# Patient Record
Sex: Male | Born: 1950 | Race: White | Hispanic: No | Marital: Married | Smoking: Never smoker
Health system: Southern US, Community
[De-identification: ages and names within clinical notes are randomized; demographics above are authoritative.]

## PROBLEM LIST (undated history)

## (undated) DIAGNOSIS — J45909 Unspecified asthma, uncomplicated: Secondary | ICD-10-CM

## (undated) DIAGNOSIS — R7303 Prediabetes: Secondary | ICD-10-CM

## (undated) DIAGNOSIS — G4730 Sleep apnea, unspecified: Secondary | ICD-10-CM

## (undated) DIAGNOSIS — I10 Essential (primary) hypertension: Secondary | ICD-10-CM

## (undated) DIAGNOSIS — M4316 Spondylolisthesis, lumbar region: Secondary | ICD-10-CM

## (undated) DIAGNOSIS — I4891 Unspecified atrial fibrillation: Secondary | ICD-10-CM

## (undated) DIAGNOSIS — Z87442 Personal history of urinary calculi: Secondary | ICD-10-CM

## (undated) DIAGNOSIS — E669 Obesity, unspecified: Secondary | ICD-10-CM

## (undated) DIAGNOSIS — N200 Calculus of kidney: Secondary | ICD-10-CM

## (undated) DIAGNOSIS — M1990 Unspecified osteoarthritis, unspecified site: Secondary | ICD-10-CM

## (undated) HISTORY — PX: COLONOSCOPY: SHX5424

## (undated) HISTORY — PX: LUMBAR FUSION: SHX111

## (undated) HISTORY — PX: TOOTH EXTRACTION: SUR596

---

## 2005-02-22 ENCOUNTER — Ambulatory Visit: Payer: Self-pay | Admitting: Unknown Physician Specialty

## 2010-08-13 ENCOUNTER — Emergency Department: Payer: Self-pay | Admitting: Emergency Medicine

## 2015-05-23 ENCOUNTER — Observation Stay
Admission: EM | Admit: 2015-05-23 | Discharge: 2015-05-27 | Disposition: A | Discharge status: Home / Self Care | Payer: BC Managed Care – PPO | Attending: Internal Medicine | Admitting: Internal Medicine

## 2015-05-23 ENCOUNTER — Emergency Department: Payer: BC Managed Care – PPO

## 2015-05-23 ENCOUNTER — Encounter: Payer: Self-pay | Admitting: Emergency Medicine

## 2015-05-23 DIAGNOSIS — M25552 Pain in left hip: Secondary | ICD-10-CM | POA: Diagnosis not present

## 2015-05-23 DIAGNOSIS — Z6841 Body Mass Index (BMI) 40.0 and over, adult: Secondary | ICD-10-CM | POA: Diagnosis not present

## 2015-05-23 DIAGNOSIS — Z87442 Personal history of urinary calculi: Secondary | ICD-10-CM | POA: Diagnosis not present

## 2015-05-23 DIAGNOSIS — M1611 Unilateral primary osteoarthritis, right hip: Principal | ICD-10-CM | POA: Insufficient documentation

## 2015-05-23 DIAGNOSIS — Z7982 Long term (current) use of aspirin: Secondary | ICD-10-CM | POA: Diagnosis not present

## 2015-05-23 DIAGNOSIS — M1990 Unspecified osteoarthritis, unspecified site: Secondary | ICD-10-CM | POA: Diagnosis not present

## 2015-05-23 DIAGNOSIS — M25551 Pain in right hip: Secondary | ICD-10-CM | POA: Diagnosis present

## 2015-05-23 DIAGNOSIS — M79604 Pain in right leg: Secondary | ICD-10-CM | POA: Diagnosis not present

## 2015-05-23 DIAGNOSIS — M1A9XX0 Chronic gout, unspecified, without tophus (tophi): Secondary | ICD-10-CM | POA: Insufficient documentation

## 2015-05-23 DIAGNOSIS — Z880 Allergy status to penicillin: Secondary | ICD-10-CM | POA: Insufficient documentation

## 2015-05-23 DIAGNOSIS — I10 Essential (primary) hypertension: Secondary | ICD-10-CM | POA: Insufficient documentation

## 2015-05-23 DIAGNOSIS — M25559 Pain in unspecified hip: Secondary | ICD-10-CM | POA: Diagnosis present

## 2015-05-23 DIAGNOSIS — Z23 Encounter for immunization: Secondary | ICD-10-CM | POA: Insufficient documentation

## 2015-05-23 DIAGNOSIS — J45909 Unspecified asthma, uncomplicated: Secondary | ICD-10-CM | POA: Diagnosis not present

## 2015-05-23 DIAGNOSIS — R52 Pain, unspecified: Secondary | ICD-10-CM

## 2015-05-23 HISTORY — DX: Unspecified osteoarthritis, unspecified site: M19.90

## 2015-05-23 HISTORY — DX: Unspecified asthma, uncomplicated: J45.909

## 2015-05-23 HISTORY — DX: Calculus of kidney: N20.0

## 2015-05-23 HISTORY — DX: Essential (primary) hypertension: I10

## 2015-05-23 HISTORY — DX: Obesity, unspecified: E66.9

## 2015-05-23 LAB — SEDIMENTATION RATE: Sed Rate: 4 mm/hr (ref 0–20)

## 2015-05-23 LAB — CBC
HEMATOCRIT: 49.3 % (ref 40.0–52.0)
HEMOGLOBIN: 16.5 g/dL (ref 13.0–18.0)
MCH: 28.8 pg (ref 26.0–34.0)
MCHC: 33.5 g/dL (ref 32.0–36.0)
MCV: 85.9 fL (ref 80.0–100.0)
Platelets: 280 10*3/uL (ref 150–440)
RBC: 5.74 MIL/uL (ref 4.40–5.90)
RDW: 14.3 % (ref 11.5–14.5)
WBC: 11.8 10*3/uL — ABNORMAL HIGH (ref 3.8–10.6)

## 2015-05-23 MED ORDER — ONDANSETRON HCL 4 MG/2ML IJ SOLN
4.0000 mg | Freq: Once | INTRAMUSCULAR | Status: AC
  Administered 2015-05-23: 4 mg via INTRAVENOUS
  Filled 2015-05-23: qty 2

## 2015-05-23 MED ORDER — OXYCODONE-ACETAMINOPHEN 5-325 MG PO TABS: 1.0000 | ORAL_TABLET | Freq: Four times a day (QID) | ORAL | Status: DC | PRN

## 2015-05-23 MED ORDER — OXYCODONE-ACETAMINOPHEN 5-325 MG PO TABS
2.0000 | ORAL_TABLET | Freq: Once | ORAL | Status: AC
  Administered 2015-05-23: 2 via ORAL
  Filled 2015-05-23: qty 2

## 2015-05-23 MED ORDER — METHYLPREDNISOLONE SODIUM SUCC 125 MG IJ SOLR
125.0000 mg | Freq: Once | INTRAMUSCULAR | Status: DC
  Filled 2015-05-23: qty 2

## 2015-05-23 MED ORDER — TRIAMCINOLONE ACETONIDE 40 MG/ML IJ SUSP
80.0000 mg | Freq: Once | INTRAMUSCULAR | Status: AC
  Administered 2015-05-24: 80 mg via INTRAMUSCULAR
  Filled 2015-05-23: qty 2

## 2015-05-23 MED ORDER — MORPHINE SULFATE (PF) 4 MG/ML IV SOLN
4.0000 mg | Freq: Once | INTRAVENOUS | Status: AC
  Administered 2015-05-23: 4 mg via INTRAVENOUS
  Filled 2015-05-23: qty 1

## 2015-05-23 NOTE — ED Notes (Signed)
Luke Morgan, Buyer, [employer], sat patient up on bed and attempted to walk patient per MD VORB.  Pt stood up and states didn't feel comfortable to walk and sat back down.  MD informed.

## 2015-05-23 NOTE — ED Notes (Signed)
Pt to ED from home via EMS c/o right hip pain for several days.  Pt states traveled to [place] and walked more than normal, denies injury.  Pt states pain is worse with movement and weight.  Pt has hx of arthritis.  Pt receives cortisone shots.  Pt is A&Ox4, speaking in complete and coherent sentences and in NAD at this time.

## 2015-05-23 NOTE — ED Notes (Signed)
Patient transported to Ultrasound 

## 2015-05-23 NOTE — ED Provider Notes (Signed)
[facility] Emergency Department Provider Note  Time seen: 8:31 PM  I have reviewed the triage vital signs and the nursing notes.   HISTORY  Chief Complaint Hip Pain    HPI Luke Morgan is a 64 y.o. male who presents the emergency department with right hip pain. According to the patient for the past one week he said progressively worsening right hip pain. For the past 2 days he has not been able to ambulate due to the pain.Denies any trauma. Denies any fever, nausea or vomiting. States a history of arthritis for which she has received cortisone shots in the past for his knee, but never for his hip. Describes his pain as minimal when lying in bed, he is able to move his hip with minimal discomfort. States severe pain if he attempts to place weight on the hip or ambulate.     No past medical history on file.  There are no active problems to display for this patient.   No past surgical history on file.  No current outpatient prescriptions on file.  Allergies Review of patient's allergies indicates not on file.  No family history on file.  Social History Social History  Substance Use Topics  . Smoking status: Not on file  . Smokeless tobacco: Not on file  . Alcohol Use: Not on file    Review of Systems Constitutional: Negative for fever. Cardiovascular: Negative for chest pain. Respiratory: Negative for shortness of breath. Gastrointestinal: Negative for abdominal pain Musculoskeletal: Positive for right hip pain Neurological: Negative for headache. Denies any weakness or numbness. 10-point ROS otherwise negative.  ____________________________________________   PHYSICAL EXAM:  Constitutional: Alert and oriented. Well appearing and in no distress. Eyes: Normal exam ENT   Head: Normocephalic and atraumatic.   Mouth/Throat: Mucous membranes are moist. Cardiovascular: Normal rate, regular rhythm. No murmur Respiratory: Normal  respiratory effort without tachypnea nor retractions. Breath sounds are clear and equal bilaterally. No wheezes/rales/rhonchi. Gastrointestinal: Soft and nontender. No distention.  Obese. Musculoskeletal: Nontender right hip. Stable pelvis. Normal range of motion in hip without eliciting discomfort. Neurologic:  Normal speech and language. No gross focal neurologic deficits  Skin:  Skin is warm, dry and intact.  Psychiatric: Mood and affect are normal. Speech and behavior are normal  ____________________________________________     RADIOLOGY  X-rays negative.  ____________________________________________   INITIAL IMPRESSION / ASSESSMENT AND PLAN / ED COURSE  Pertinent labs & imaging results that were available during my care of the patient were reviewed by me and considered in my medical decision making (see chart for details).  Patient presents for right hip pain. Denies any trauma. Patient has a nontender exam with good range of motion in the right hip. Patient states minimal discomfort while ranging the hip or while lying in bed but severe discomfort. So ambulate. Has been taking naproxen without relief. Patient states he called his orthopedist to see if he could come to his house to do an injection of steroids. The orthopedist that he does not do house calls and the patient would have to go to his office, but the patient states he is unable to walk due to pain so he called EMS to bring him to the ER.  Patient continues to have right hip pain. X-ray negative. Very reassuringly the patient has a normal ESR. Do not suspect septic joint given a great range of motion. Do not suspect gouty arthritis given the great range of motion. We'll obtain a CT scan to  rule out occult fracture. We will also give Solu-Medrol as he states steroids have helped with his joint pains in the past.   Patient and did ambulate, had pain in right leg. He states it is somewhat in the hip, but also feels that it  extends down towards his knee on the inside of his thigh. Unfortunately the patient's weight will not allow [place] to obtain a CT scan. The patient has great range of motion in the hip, my suspicion for an occult fracture is extremely low. We will obtain an ultrasound to rule out deep venous thrombosis. We will dose Kenalog IM, Percocet orally, and monitor in the emergency department.   Ultrasound pending. Patient care signed out to Dr. Beather Arbour. ____________________________________________   FINAL CLINICAL IMPRESSION(S) / ED DIAGNOSES  Right hip pain   Harvest Dark, MD 05/23/15 [phone]

## 2015-05-23 NOTE — Discharge Instructions (Signed)

## 2015-05-24 ENCOUNTER — Encounter: Payer: Self-pay | Admitting: Internal Medicine

## 2015-05-24 DIAGNOSIS — M25559 Pain in unspecified hip: Secondary | ICD-10-CM | POA: Diagnosis present

## 2015-05-24 LAB — CREATININE, SERUM: Creatinine, Ser: 1.04 mg/dL (ref 0.61–1.24)

## 2015-05-24 MED ORDER — MOMETASONE FURO-FORMOTEROL FUM 100-5 MCG/ACT IN AERO
2.0000 | INHALATION_SPRAY | Freq: Two times a day (BID) | RESPIRATORY_TRACT | Status: DC
  Filled 2015-05-24: qty 8.8

## 2015-05-24 MED ORDER — HYDRALAZINE HCL 50 MG PO TABS
50.0000 mg | ORAL_TABLET | Freq: Three times a day (TID) | ORAL | Status: DC
  Administered 2015-05-24 – 2015-05-27 (×10): 50 mg via ORAL
  Filled 2015-05-24 (×10): qty 1

## 2015-05-24 MED ORDER — ACETAMINOPHEN 325 MG PO TABS: 650.0000 mg | ORAL_TABLET | Freq: Four times a day (QID) | ORAL | Status: DC | PRN

## 2015-05-24 MED ORDER — ACETAMINOPHEN 650 MG RE SUPP: 650.0000 mg | Freq: Four times a day (QID) | RECTAL | Status: DC | PRN

## 2015-05-24 MED ORDER — HYDROCHLOROTHIAZIDE 25 MG PO TABS
25.0000 mg | ORAL_TABLET | Freq: Every day | ORAL | Status: DC
  Administered 2015-05-24 – 2015-05-27 (×4): 25 mg via ORAL
  Filled 2015-05-24 (×4): qty 1

## 2015-05-24 MED ORDER — DOCUSATE SODIUM 100 MG PO CAPS
100.0000 mg | ORAL_CAPSULE | Freq: Two times a day (BID) | ORAL | Status: DC
  Administered 2015-05-24 – 2015-05-27 (×8): 100 mg via ORAL
  Filled 2015-05-24 (×8): qty 1

## 2015-05-24 MED ORDER — SODIUM CHLORIDE 0.9 % IJ SOLN
3.0000 mL | Freq: Two times a day (BID) | INTRAMUSCULAR | Status: DC
  Administered 2015-05-24 – 2015-05-26 (×5): 3 mL via INTRAVENOUS

## 2015-05-24 MED ORDER — LOSARTAN POTASSIUM-HCTZ 100-25 MG PO TABS: 1.0000 | ORAL_TABLET | Freq: Every day | ORAL | Status: DC

## 2015-05-24 MED ORDER — FLEET ENEMA 7-19 GM/118ML RE ENEM: 1.0000 | ENEMA | Freq: Once | RECTAL | Status: DC | PRN

## 2015-05-24 MED ORDER — ASPIRIN EC 81 MG PO TBEC
81.0000 mg | DELAYED_RELEASE_TABLET | Freq: Every day | ORAL | Status: DC
  Administered 2015-05-24 – 2015-05-27 (×4): 81 mg via ORAL
  Filled 2015-05-24 (×4): qty 1

## 2015-05-24 MED ORDER — POLYETHYLENE GLYCOL 3350 17 G PO PACK: 17.0000 g | PACK | Freq: Every day | ORAL | Status: DC | PRN

## 2015-05-24 MED ORDER — SODIUM CHLORIDE 0.9 % IJ SOLN
3.0000 mL | INTRAMUSCULAR | Status: DC | PRN
  Administered 2015-05-24: 3 mL via INTRAVENOUS
  Filled 2015-05-24: qty 10

## 2015-05-24 MED ORDER — HYDROCODONE-ACETAMINOPHEN 5-325 MG PO TABS
1.0000 | ORAL_TABLET | ORAL | Status: DC | PRN
  Administered 2015-05-24: 2 via ORAL
  Administered 2015-05-24 – 2015-05-25 (×4): 1 via ORAL
  Administered 2015-05-25: 2 via ORAL
  Administered 2015-05-25: 1 via ORAL
  Administered 2015-05-25: 2 via ORAL
  Administered 2015-05-25: 1 via ORAL
  Administered 2015-05-26 – 2015-05-27 (×5): 2 via ORAL
  Administered 2015-05-27: 1 via ORAL
  Filled 2015-05-24 (×3): qty 2
  Filled 2015-05-24: qty 1
  Filled 2015-05-24: qty 2
  Filled 2015-05-24: qty 1
  Filled 2015-05-24: qty 2
  Filled 2015-05-24: qty 1
  Filled 2015-05-24: qty 2
  Filled 2015-05-24: qty 1
  Filled 2015-05-24 (×2): qty 2
  Filled 2015-05-24 (×3): qty 1

## 2015-05-24 MED ORDER — ALLOPURINOL 300 MG PO TABS
300.0000 mg | ORAL_TABLET | Freq: Every day | ORAL | Status: DC
  Administered 2015-05-24 – 2015-05-27 (×4): 300 mg via ORAL
  Filled 2015-05-24 (×4): qty 1

## 2015-05-24 MED ORDER — ONDANSETRON HCL 4 MG PO TABS: 4.0000 mg | ORAL_TABLET | Freq: Four times a day (QID) | ORAL | Status: DC | PRN

## 2015-05-24 MED ORDER — LOSARTAN POTASSIUM 50 MG PO TABS
100.0000 mg | ORAL_TABLET | Freq: Every day | ORAL | Status: DC
  Administered 2015-05-24 – 2015-05-27 (×4): 100 mg via ORAL
  Filled 2015-05-24 (×4): qty 2

## 2015-05-24 MED ORDER — ASPIRIN EC 81 MG PO TBEC: 81.0000 mg | DELAYED_RELEASE_TABLET | Freq: Every day | ORAL | Status: DC

## 2015-05-24 MED ORDER — ENOXAPARIN SODIUM 40 MG/0.4ML ~~LOC~~ SOLN: 40.0000 mg | SUBCUTANEOUS | Status: DC

## 2015-05-24 MED ORDER — ENOXAPARIN SODIUM 40 MG/0.4ML ~~LOC~~ SOLN
40.0000 mg | Freq: Two times a day (BID) | SUBCUTANEOUS | Status: DC
  Administered 2015-05-24 – 2015-05-27 (×7): 40 mg via SUBCUTANEOUS
  Filled 2015-05-24 (×7): qty 0.4

## 2015-05-24 MED ORDER — SODIUM CHLORIDE 0.9 % IV SOLN: 250.0000 mL | INTRAVENOUS | Status: DC | PRN

## 2015-05-24 MED ORDER — ONDANSETRON HCL 4 MG/2ML IJ SOLN: 4.0000 mg | Freq: Four times a day (QID) | INTRAMUSCULAR | Status: DC | PRN

## 2015-05-24 MED ORDER — BISACODYL 5 MG PO TBEC
5.0000 mg | DELAYED_RELEASE_TABLET | Freq: Every day | ORAL | Status: DC | PRN
Start: 2015-05-24 — End: 2015-05-27

## 2015-05-24 MED ORDER — ALBUTEROL SULFATE (2.5 MG/3ML) 0.083% IN NEBU: 2.5000 mg | INHALATION_SOLUTION | RESPIRATORY_TRACT | Status: DC | PRN

## 2015-05-24 NOTE — Evaluation (Signed)
Physical Therapy Evaluation Patient Details Name: Luke Morgan MRN: [id number] DOB: 04-15-1951 Today's Date: 05/24/2015   History of Present Illness  64 yo male with onset of R hip pain and limited tolerance for mobility esp to sit or stand/walk, best with supine  Clinical Impression  Pt was instructed on R hip extension stretch with emphasis on using gentle movement.  Pt was noted to throw himself over onto L side and was not performing correctly and not listening to PT instruction well.  Will anticipate that outpatient therapy will be more potentially beneficial than HHPT but pt has not fully committed to his plan.    Follow Up Recommendations Home health PT;Outpatient PT (outpatient if pt able to get there)    Equipment Recommendations  Rolling walker with 5" wheels    Recommendations for Other Services       Precautions / Restrictions Precautions Precautions: None Restrictions Weight Bearing Restrictions: No      Mobility  Bed Mobility Overal bed mobility: Modified Independent                Transfers Overall transfer level: Modified independent                  Ambulation/Gait Ambulation/Gait assistance: Modified independent (Device/Increase time);Supervision (observed to talk with pt ) Ambulation Distance (Feet): 30 Feet Assistive device: Rolling walker (2 wheeled) Gait Pattern/deviations: Step-through pattern;Decreased stride length;Wide base of support Gait velocity: reduced Gait velocity interpretation: Below normal speed for age/gender    Stairs Stairs:  (not performed)          Wheelchair Mobility    Modified Rankin (Stroke Patients Only)       Balance Overall balance assessment: Modified Independent                                           Pertinent Vitals/Pain Pain Assessment: 0-10 Pain Score: 8  Pain Location: R hip Pain Intervention(s): Limited activity within patient's tolerance;Monitored during  session;Repositioned;Premedicated before session    Home Living Family/patient expects to be discharged to:: Private residence Living Arrangements: Alone   Type of Home: House Home Access: Stairs to enter Entrance Stairs-Rails: Right;Left;Can reach both Secretary/administrator of Steps: 8 Home Layout: One level Home Equipment: None Additional Comments: Pt is not describing concerns about stairs, just about relieving pain on RLE    Prior Function Level of Independence: Independent               Hand Dominance        Extremity/Trunk Assessment   Upper Extremity Assessment: Overall WFL for tasks assessed           Lower Extremity Assessment: Overall WFL for tasks assessed (R hip flexion contracture)      Cervical / Trunk Assessment: Normal  Communication   Communication: No difficulties  Cognition Arousal/Alertness: Awake/alert Behavior During Therapy: WFL for tasks assessed/performed Overall Cognitive Status: Within Functional Limits for tasks assessed                      General Comments General comments (skin integrity, edema, etc.): Pt is in pain in R hip mainly when sitting or standing but relieved by supine posture    Exercises        Assessment/Plan    PT Assessment Patient needs continued PT services  PT Diagnosis Difficulty walking;Acute pain  PT Problem List Decreased range of motion;Decreased activity tolerance;Decreased balance;Decreased mobility;Decreased knowledge of use of DME;Obesity;Pain  PT Treatment Interventions DME instruction;Gait training;Stair training;Functional mobility training;Therapeutic activities;Therapeutic exercise;Balance training;Neuromuscular re-education;Patient/family education   PT Goals (Current goals can be found in the Care Plan section) Acute Rehab PT Goals Patient Stated Goal: to get the pain better in R hip PT Goal Formulation: With patient Time For Goal Achievement: 06/07/15 Potential to Achieve  Goals: Good    Frequency Min 2X/week   Barriers to discharge Inaccessible home environment 8 steps to enter house    Co-evaluation               End of Session   Activity Tolerance: Patient tolerated treatment well;Patient limited by pain Patient left: in bed;with call bell/phone within reach;with bed alarm set Nurse Communication: Mobility status    Functional Assessment Tool Used: clinical judgment Functional Limitation: Mobility: Walking and moving around Mobility: Walking and Moving Around Current Status (V0131): At least 20 percent but less than 40 percent impaired, limited or restricted Mobility: Walking and Moving Around Goal Status [phone]): At least 20 percent but less than 40 percent impaired, limited or restricted    Time: 0855-0921 PT Time Calculation (min) (ACUTE ONLY): 26 min   Charges:   PT Evaluation $Initial PT Evaluation Tier I: 1 Procedure PT Treatments $Therapeutic Exercise: 8-22 mins   PT G Codes:   PT G-Codes **NOT FOR INPATIENT CLASS** Functional Assessment Tool Used: clinical judgment Functional Limitation: Mobility: Walking and moving around Mobility: Walking and Moving Around Current Status (L5797): At least 20 percent but less than 40 percent impaired, limited or restricted Mobility: Walking and Moving Around Goal Status [phone]): At least 20 percent but less than 40 percent impaired, limited or restricted    Ivar Drape 05/24/2015, 12:01 PM   Samul Dada, PT MS Acute Rehab Dept. Number: [facility] [id number] and [facility] [phone]

## 2015-05-24 NOTE — Progress Notes (Signed)
Spoke with MD. Per Dr. Rodena Medin, fluoro guided procedure to be performed on Monday. Per Dr. Luberta Mutter, pt would likely benefit from another session with PT, plan for 24 hours and re-evaluate. Pt with decreased mobility due to pain. Focus on pain control and follow up with PT.

## 2015-05-24 NOTE — Progress Notes (Signed)
[facility] - [place] at [facility]   PATIENT NAME: Luke Morgan    MR#:  [id number]  DATE OF BIRTH:  1950-07-01  SUBJECTIVE: Patient admitted for right hip pain with intractable unable to ambulate. No numbness in the legs. Range of motion is normal in the right leg.   CHIEF COMPLAINT:   Chief Complaint  Patient presents with  . Hip Pain    REVIEW OF SYSTEMS:   ROS CONSTITUTIONAL: No fever, fatigue or weakness.  EYES: No blurred or double vision.  EARS, NOSE, AND THROAT: No tinnitus or ear pain.  RESPIRATORY: No cough, shortness of breath, wheezing or hemoptysis.  CARDIOVASCULAR: No chest pain, orthopnea, edema.  GASTROINTESTINAL: No nausea, vomiting, diarrhea or abdominal pain.  GENITOURINARY: No dysuria, hematuria.  ENDOCRINE: No polyuria, nocturia,  HEMATOLOGY: No anemia, easy bruising or bleeding SKIN: No rash or lesion. MUSCULOSKELETAL;right hip pain.  NEUROLOGIC: No tingling, numbness, weakness.  PSYCHIATRY: No anxiety or depression.   DRUG ALLERGIES:   Allergies  Allergen Reactions  . Penicillins     Has patient had a PCN reaction causing immediate rash, facial/tongue/throat swelling, SOB or lightheadedness with hypotension: Yes Has patient had a PCN reaction causing severe rash involving mucus membranes or skin necrosis: No Has patient had a PCN reaction that required hospitalization No Has patient had a PCN reaction occurring within the last 10 years: No If all of the above answers are "NO", then may proceed with Cephalosporin use.    VITALS:  Blood pressure 161/106, pulse 88, temperature 98 F (36.7 C), temperature source Oral, resp. rate 20, height  (1.956 m), weight 190.511 kg (420 lb), SpO2 95 %.  PHYSICAL EXAMINATION:  GENERAL:  64 y.o.-year-old patient lying in the bed with no acute distress.  EYES: Pupils equal, round, reactive to light and accommodation. No scleral icterus. Extraocular muscles intact.  HEENT: Head  atraumatic, normocephalic. Oropharynx and nasopharynx clear.  NECK:  Supple, no jugular venous distention. No thyroid enlargement, no tenderness.  LUNGS: Normal breath sounds bilaterally, no wheezing, rales,rhonchi or crepitation. No use of accessory muscles of respiration.  CARDIOVASCULAR: S1, S2 normal. No murmurs, rubs, or gallops.  ABDOMEN: Soft, nontender, nondistended. Bowel sounds present. No organomegaly or mass.  EXTREMITIES; NEUROLOGIC: Cranial nerves II through XII are intact. Muscle strength 5/5 in all extremities. Sensation intact. Gait not checked.  PSYCHIATRIC: The patient is alert and oriented x 3.  SKIN: No obvious rash, lesion, or ulcer.    LABORATORY PANEL:   CBC  Recent Labs Lab 05/23/15 2047  WBC 11.8*  HGB 16.5  HCT 49.3  PLT 280   ------------------------------------------------------------------------------------------------------------------  Chemistries   Recent Labs Lab 05/24/15 0711  CREATININE 1.04   ------------------------------------------------------------------------------------------------------------------  Cardiac Enzymes No results for input(s): TROPONINI in the last 168 hours. ------------------------------------------------------------------------------------------------------------------  RADIOLOGY:  US Venous Img Lower Unilateral Right  05/24/2015  CLINICAL DATA:  Acute onset of right hip pain of extending to the right knee. Initial encounter. EXAM: RIGHT LOWER EXTREMITY VENOUS DOPPLER ULTRASOUND TECHNIQUE: Gray-scale sonography with graded compression, as well as color Doppler and duplex ultrasound were performed to evaluate the lower extremity deep venous systems from the level of the common femoral vein and including the common femoral, femoral, profunda femoral, popliteal and calf veins including the posterior tibial, peroneal and gastrocnemius veins when visible. The superficial great saphenous vein was also interrogated.  Spectral Doppler was utilized to evaluate flow at rest [facility] maneuvers in the common femoral, femoral and popliteal  veins. COMPARISON:  None. FINDINGS: Contralateral Common Femoral Vein: Respiratory phasicity is normal and symmetric with the symptomatic side. No evidence of thrombus. Normal compressibility. Common Femoral Vein: No evidence of thrombus. Normal compressibility, respiratory phasicity and response to augmentation. Saphenofemoral Junction: No evidence of thrombus. Normal compressibility and flow on color Doppler imaging. Profunda Femoral Vein: No evidence of thrombus. Normal compressibility and flow on color Doppler imaging. Femoral Vein: No evidence of thrombus. Normal compressibility, respiratory phasicity and response to augmentation. Popliteal Vein: No evidence of thrombus. Normal compressibility, respiratory phasicity and response to augmentation. Calf Veins: No evidence of thrombus. Normal compressibility and flow on color Doppler imaging. Superficial Great Saphenous Vein: No evidence of thrombus. Normal compressibility and flow on color Doppler imaging. Venous Reflux:  None. Other Findings:  None. IMPRESSION: No evidence of deep venous thrombosis. Electronically Signed   By: Roanna Raider M.D.   On: 05/24/2015 00:18   Dg Hip Unilat With Pelvis 2-3 Views Right  05/23/2015  CLINICAL DATA:  Initial encounter for several day history of right hip pain after strenuous walking. EXAM: DG HIP (WITH OR WITHOUT PELVIS) 2-3V RIGHT COMPARISON:  None. FINDINGS: Frontal pelvis shows no fracture. SI joints and symphysis pubis are unremarkable. AP and frog-leg lateral views of the right hip show no evidence for femoral neck fracture. Joint space in the hip is relatively well preserved without substantial degenerative change. No worrisome lytic or sclerotic osseous abnormality. IMPRESSION: Negative. Electronically Signed   By: Kennith Center M.D.   On: 05/23/2015 21:35    EKG:  No  orders found for this or any previous visit.  ASSESSMENT AND PLAN:  : Right hip pain: Due to arthritis flare. Unable to ambulate and unable to bear weight:Kenalog injection to the right hip yesterday in the ER, Continue Percocet.. Check CT of the hip to evaluate for occult fracture. Seen by the ortho PT consult  #2 hypertension controlled #3 asthma stable. All the records are reviewed and case discussed with Care Management/Social Workerr. Management plans discussed with the patient, family and they are in agreement.  CODE STATUS: Full  TOTAL TIME TAKING CARE OF THIS PATIENT: 30 minutes.   POSSIBLE D/C IN 1-2 DAYS, DEPENDING ON CLINICAL CONDITION.   Katha Hamming M.D on 05/24/2015 at 11:04 AM  Between 7am to 6pm - Pager - [phone]  After 6pm go to [email or web address] - password [email or web address]  [facility]  Office  [phone]  CC: Primary care physician; Barbette Reichmann, MD   Note: This dictation was prepared with Dragon dictation along with smaller phrase technology. Any transcriptional errors that result from this process are unintentional.

## 2015-05-24 NOTE — Progress Notes (Signed)
New orders received for MRI or CT. Per radiologist, pt is over weight limit of 400lbs. Notified MD. Cancelled order, x-ray sufficient.

## 2015-05-24 NOTE — H&P (Signed)
[facility] [place] NAME: Luke Morgan    MR#:  [id number]  DATE OF BIRTH:  June 12, 1950  DATE OF ADMISSION:  05/23/2015  PRIMARY CARE PHYSICIAN: Tracie Harrier, MD   REQUESTING/REFERRING PHYSICIAN: Dr. Beather Arbour  CHIEF COMPLAINT:   Chief Complaint  Patient presents with  . Hip Pain    HISTORY OF PRESENT ILLNESS:  Luke Morgan  is a 64 y.o. male with a known history of asthma, osteoarthritis, hypertension presents to the emergency room due to worsening right hip pain. Patient has had right hip pain chronically but has worsened over the last week to the point that he is unable to bear weight on right leg. Patient lives alone. In spite of steroid injection and pain medications patient is unable to walk and is being admitted to the hospital under observation. He did have steroid injections into the knees recently which have helped. He initially called his orthopedic doctor who requested patient come into the office. But patient due to worsening pain and presented to the emergency room. X-ray showed no fracture. Afebrile. Normal WBC. Normal ESR.   PAST MEDICAL HISTORY:   Past Medical History  Diagnosis Date  . Hypertension   . Kidney stones   . Asthma     PAST SURGICAL HISTORY:  History reviewed. No pertinent past surgical history.  SOCIAL HISTORY:   Social History  Substance Use Topics  . Smoking status: Never Smoker   . Smokeless tobacco: Not on file  . Alcohol Use: Yes     Comment: meals    FAMILY HISTORY:  History reviewed. No pertinent family history.  DRUG ALLERGIES:   Allergies  Allergen Reactions  . Penicillins     Has patient had a PCN reaction causing immediate rash, facial/tongue/throat swelling, SOB or lightheadedness with hypotension: Yes Has patient had a PCN reaction causing severe rash involving mucus membranes or skin necrosis: No Has patient had a PCN reaction that required hospitalization No Has  patient had a PCN reaction occurring within the last 10 years: No If all of the above answers are "NO", then may proceed with Cephalosporin use.    REVIEW OF SYSTEMS:   Review of Systems  Constitutional: Negative for fever, chills and weight loss.  HENT: Negative for hearing loss and nosebleeds.   Eyes: Negative for blurred vision, double vision and pain.  Respiratory: Negative for cough, hemoptysis, sputum production, shortness of breath and wheezing.   Cardiovascular: Negative for chest pain, palpitations, orthopnea and leg swelling.  Gastrointestinal: Negative for nausea, vomiting, abdominal pain, diarrhea and constipation.  Genitourinary: Negative for dysuria and hematuria.  Musculoskeletal: Positive for back pain and joint pain. Negative for myalgias and falls.  Skin: Negative for rash.  Neurological: Negative for dizziness, tremors, sensory change, speech change, focal weakness, seizures and headaches.  Endo/Heme/Allergies: Does not bruise/bleed easily.  Psychiatric/Behavioral: Negative for depression and memory loss. The patient is not nervous/anxious.     MEDICATIONS AT HOME:   Prior to Admission medications   Medication Sig Start Date End Date Taking? Authorizing Provider  allopurinol (ZYLOPRIM) 300 MG tablet Take 300 mg by mouth daily.   Yes Historical Provider, MD  aspirin EC 81 MG tablet Take 81 mg by mouth daily.   Yes Historical Provider, MD  Fluticasone-Salmeterol (ADVAIR) 100-50 MCG/DOSE AEPB Inhale 1 puff into the lungs 2 (two) times daily.   Yes Historical Provider, MD  hydrALAZINE (APRESOLINE) 50 MG tablet Take 50 mg by mouth 3 (three) times daily.  Yes Historical Provider, MD  losartan-hydrochlorothiazide (HYZAAR) 100-25 MG tablet Take 1 tablet by mouth daily.   Yes Historical Provider, MD  oxyCODONE-acetaminophen (ROXICET) 5-325 MG tablet Take 1 tablet by mouth every 6 (six) hours as needed. 05/23/15   Harvest Dark, MD      VITAL SIGNS:  Blood pressure  158/108, pulse 90, temperature 98.2 F (36.8 C), temperature source Oral, resp. rate 18, height '6\' 5"'  (1.956 m), weight 190.511 kg (420 lb), SpO2 94 %.  PHYSICAL EXAMINATION:  Physical Exam  GENERAL:  64 y.o.-year-old patient lying in the bed with no acute distress.  EYES: Pupils equal, round, reactive to light and accommodation. No scleral icterus. Extraocular muscles intact.  HEENT: Head atraumatic, normocephalic. Oropharynx and nasopharynx clear. No oropharyngeal erythema, moist oral mucosa  NECK:  Supple, no jugular venous distention. No thyroid enlargement, no tenderness.  LUNGS: Normal breath sounds bilaterally, no wheezing, rales, rhonchi. No use of accessory muscles of respiration.  CARDIOVASCULAR: S1, S2 normal. No murmurs, rubs, or gallops.  ABDOMEN: Soft, nontender, nondistended. Bowel sounds present. No organomegaly or mass.  EXTREMITIES: No pedal edema, cyanosis, or clubbing. + 2 pedal & radial pulses b/l.   NEUROLOGIC: Cranial nerves II through XII are intact. No focal Motor or sensory deficits appreciated b/l PSYCHIATRIC: The patient is alert and oriented x 3. Good affect.  SKIN: No obvious rash, lesion, or ulcer.   LABORATORY PANEL:   CBC  Recent Labs Lab 05/23/15 2047  WBC 11.8*  HGB 16.5  HCT 49.3  PLT 280   ------------------------------------------------------------------------------------------------------------------  Chemistries  No results for input(s): NA, K, CL, CO2, GLUCOSE, BUN, CREATININE, CALCIUM, MG, AST, ALT, ALKPHOS, BILITOT in the last 168 hours.  Invalid input(s): GFRCGP ------------------------------------------------------------------------------------------------------------------  Cardiac Enzymes No results for input(s): TROPONINI in the last 168 hours. ------------------------------------------------------------------------------------------------------------------  RADIOLOGY:  US Venous Img Lower Unilateral Right  05/24/2015   CLINICAL DATA:  Acute onset of right hip pain of extending to the right knee. Initial encounter. EXAM: RIGHT LOWER EXTREMITY VENOUS DOPPLER ULTRASOUND TECHNIQUE: Gray-scale sonography with graded compression, as well as color Doppler and duplex ultrasound were performed to evaluate the lower extremity deep venous systems from the level of the common femoral vein and including the common femoral, femoral, profunda femoral, popliteal and calf veins including the posterior tibial, peroneal and gastrocnemius veins when visible. The superficial great saphenous vein was also interrogated. Spectral Doppler was utilized to evaluate flow at rest and with distal augmentation maneuvers in the common femoral, femoral and popliteal veins. COMPARISON:  None. FINDINGS: Contralateral Common Femoral Vein: Respiratory phasicity is normal and symmetric with the symptomatic side. No evidence of thrombus. Normal compressibility. Common Femoral Vein: No evidence of thrombus. Normal compressibility, respiratory phasicity and response to augmentation. Saphenofemoral Junction: No evidence of thrombus. Normal compressibility and flow on color Doppler imaging. Profunda Femoral Vein: No evidence of thrombus. Normal compressibility and flow on color Doppler imaging. Femoral Vein: No evidence of thrombus. Normal compressibility, respiratory phasicity and response to augmentation. Popliteal Vein: No evidence of thrombus. Normal compressibility, respiratory phasicity and response to augmentation. Calf Veins: No evidence of thrombus. Normal compressibility and flow on color Doppler imaging. Superficial Great Saphenous Vein: No evidence of thrombus. Normal compressibility and flow on color Doppler imaging. Venous Reflux:  None. Other Findings:  None. IMPRESSION: No evidence of deep venous thrombosis. Electronically Signed   By: Garald Balding M.D.   On: 05/24/2015 00:18   Dg Hip Unilat With Pelvis 2-3 Views Right  05/23/2015  CLINICAL  DATA:   Initial encounter for several day history of right hip pain after strenuous walking. EXAM: DG HIP (WITH OR WITHOUT PELVIS) 2-3V RIGHT COMPARISON:  None. FINDINGS: Frontal pelvis shows no fracture. SI joints and symphysis pubis are unremarkable. AP and frog-leg lateral views of the right hip show no evidence for femoral neck fracture. Joint space in the hip is relatively well preserved without substantial degenerative change. No worrisome lytic or sclerotic osseous abnormality. IMPRESSION: Negative. Electronically Signed   By: Misty Stanley M.D.   On: 05/23/2015 21:35     IMPRESSION AND PLAN:   * Worsening right hip pain Likely osteoarthritis. Has good range of motion. Unable to bear weight. X-ray showed no fracture. Afebrile. Normal WBC. ESR 4. Pain mainly when bearing weight. Consult orthopedics. Physical therapy consult. Social work consult for possible placement. Lives alone and unable to discharge patient home safely and will be admitted. Due to patient's morbid obesity CT scan could not be done. Although fracture seems unlikely.  * Hypertension Continue home medications  * Asthma stable  All the records are reviewed and case discussed with ED provider. Management plans discussed with the patient, family and they are in agreement.  CODE STATUS: FULL  TOTAL TIME TAKING CARE OF THIS PATIENT: 40 minutes.    Hillary Bow R M.D on 05/24/2015 at 2:12 AM  Between 7am to 6pm - Pager - [phone]  After 6pm go to [email or web address] - password [email or web address] [facility]  Office  [phone]  CC: Primary care physician; Tracie Harrier, MD     Note: This dictation was prepared with Dragon dictation along with smaller phrase technology. Any transcriptional errors that result from this process are unintentional.

## 2015-05-24 NOTE — ED Provider Notes (Signed)
-----------------------------------------   1:57 AM on 05/24/2015 -----------------------------------------  Right lower extremity Doppler interpreted per Dr. Cherly Hensen: No evidence of deep venous thrombosis.  Attempted to ambulate patient with walker. Patient was able to stand up, place weight on walker but then collapsed onto the bed secondary to severe pain. He is unsafe for discharge home secondary to fall risk. We are unable to CT his hip to evaluate for occult fracture secondary to his large body habitus. Discussed with hospitalist to evaluate in the ED for admission for pain control, PT and orthopedic evaluations.  Irean Hong, MD 05/24/15 [phone]

## 2015-05-24 NOTE — Progress Notes (Signed)
ANTICOAGULATION CONSULT NOTE   Pharmacy Consult for Dose Adjustment of Enoxaparin  Indication: VTE prophylaxis  Patient Measurements: Height: 6\' 5"  (195.6 cm) Weight: (!) 420 lb (190.511 kg) IBW/kg (Calculated) : 89.1 BMI: 49.5   Recent Labs  05/23/15 2047 05/24/15 0711  HGB 16.5  --   HCT 49.3  --   PLT 280  --   CREATININE  --  1.04    Estimated Creatinine Clearance: 133.4 mL/min (by C-G formula based on Cr of 1.04).  Assessment: 64 yo male needed VTE prophylaxis with enoxaparin   Wt: 190 kg    Ht:  77in    CrCl: 162ml/min   Scr: 1.04  BMI: 49   Plan:  Patient was ordered enoxaparin 40mg  q24 hours. Due to patients BMI of 49 (BMI >40) patient is candidate for enoxaparin 40mg  q12hr dosing. Per [facility] policy, will change patients dose to enoxaparin 40mg  q12hrs.   Cher Nakai, PharmD Pharmacy Resident  05/24/2015,8:29 AM

## 2015-05-24 NOTE — ED Notes (Signed)
Pt removing blood pressure cuff and O2 sensor refusing to keep it on.

## 2015-05-24 NOTE — Consult Note (Signed)
ORTHOPAEDIC CONSULTATION  REQUESTING PHYSICIAN: Katha Hamming, MD  Chief Complaint: Right hip pain  HPI: QUATAVIOUS ROSSA is a 64 y.o. male who complains of  Right hip pain. He states he has had insidious onset of right hip pain over the past 5 days that has gotten progressively worse to the point he has pain with ambulation. He denies any falls or inciting traumatic events. He denies any fevers or chills. He describes the pain as similar to his knee arthritic pain and presented hoping to get a steroid shot. He has no pain at rest and just minimal discomfort with passive ROM. He is morbidly obese, but states his only PMH is HTN, OA, and sleep apnea. He denies any headaches, LOC, blurry or double vision, chest pain, or shortness of breath.  Past Medical History  Diagnosis Date  . Hypertension   . Kidney stones   . Asthma   . Osteoarthritis   . Obesity    History reviewed. No pertinent past surgical history. Social History   Social History  . Marital Status: Married    Spouse Name: N/A  . Number of Children: N/A  . Years of Education: N/A   Social History Main Topics  . Smoking status: Never Smoker   . Smokeless tobacco: None  . Alcohol Use: Yes     Comment: meals  . Drug Use: No  . Sexual Activity: Not Asked   Other Topics Concern  . None   Social History Narrative   History reviewed. No pertinent family history. Allergies  Allergen Reactions  . Penicillins     Has patient had a PCN reaction causing immediate rash, facial/tongue/throat swelling, SOB or lightheadedness with hypotension: Yes Has patient had a PCN reaction causing severe rash involving mucus membranes or skin necrosis: No Has patient had a PCN reaction that required hospitalization No Has patient had a PCN reaction occurring within the last 10 years: No If all of the above answers are "NO", then may proceed with Cephalosporin use.   Prior to Admission medications   Medication Sig Start Date End  Date Taking? Authorizing Provider  allopurinol (ZYLOPRIM) 300 MG tablet Take 300 mg by mouth daily.   Yes Historical Provider, MD  aspirin EC 81 MG tablet Take 81 mg by mouth daily.   Yes Historical Provider, MD  Fluticasone-Salmeterol (ADVAIR) 100-50 MCG/DOSE AEPB Inhale 1 puff into the lungs 2 (two) times daily.   Yes Historical Provider, MD  hydrALAZINE (APRESOLINE) 50 MG tablet Take 50 mg by mouth 3 (three) times daily.   Yes Historical Provider, MD  losartan-hydrochlorothiazide (HYZAAR) 100-25 MG tablet Take 1 tablet by mouth daily.   Yes Historical Provider, MD  oxyCODONE-acetaminophen (ROXICET) 5-325 MG tablet Take 1 tablet by mouth every 6 (six) hours as needed. 05/23/15   Minna Antis, MD   US Venous Img Lower Unilateral Right  05/24/2015  CLINICAL DATA:  Acute onset of right hip pain of extending to the right knee. Initial encounter. EXAM: RIGHT LOWER EXTREMITY VENOUS DOPPLER ULTRASOUND TECHNIQUE: Gray-scale sonography with graded compression, as well as color Doppler and duplex ultrasound were performed to evaluate the lower extremity deep venous systems from the level of the common femoral vein and including the common femoral, femoral, profunda femoral, popliteal and calf veins including the posterior tibial, peroneal and gastrocnemius veins when visible. The superficial great saphenous vein was also interrogated. Spectral Doppler was utilized to evaluate flow at rest and with distal augmentation maneuvers in the common femoral, femoral and popliteal  veins. COMPARISON:  None. FINDINGS: Contralateral Common Femoral Vein: Respiratory phasicity is normal and symmetric with the symptomatic side. No evidence of thrombus. Normal compressibility. Common Femoral Vein: No evidence of thrombus. Normal compressibility, respiratory phasicity and response to augmentation. Saphenofemoral Junction: No evidence of thrombus. Normal compressibility and flow on color Doppler imaging. Profunda Femoral Vein:  No evidence of thrombus. Normal compressibility and flow on color Doppler imaging. Femoral Vein: No evidence of thrombus. Normal compressibility, respiratory phasicity and response to augmentation. Popliteal Vein: No evidence of thrombus. Normal compressibility, respiratory phasicity and response to augmentation. Calf Veins: No evidence of thrombus. Normal compressibility and flow on color Doppler imaging. Superficial Great Saphenous Vein: No evidence of thrombus. Normal compressibility and flow on color Doppler imaging. Venous Reflux:  None. Other Findings:  None. IMPRESSION: No evidence of deep venous thrombosis. Electronically Signed   By: Roanna Raider M.D.   On: 05/24/2015 00:18   Dg Hip Unilat With Pelvis 2-3 Views Right  05/23/2015  CLINICAL DATA:  Initial encounter for several day history of right hip pain after strenuous walking. EXAM: DG HIP (WITH OR WITHOUT PELVIS) 2-3V RIGHT COMPARISON:  None. FINDINGS: Frontal pelvis shows no fracture. SI joints and symphysis pubis are unremarkable. AP and frog-leg lateral views of the right hip show no evidence for femoral neck fracture. Joint space in the hip is relatively well preserved without substantial degenerative change. No worrisome lytic or sclerotic osseous abnormality. IMPRESSION: Negative. Electronically Signed   By: Kennith Center M.D.   On: 05/23/2015 21:35    Positive ROS: All other systems have been reviewed and were otherwise negative with the exception of those mentioned in the HPI and as above.  Physical Exam: General: Alert, no acute distress Cardiovascular: No pedal edema Respiratory: No cyanosis, no use of accessory musculature GI: No organomegaly, abdomen is soft and non-tender Skin: No lesions in the area of chief complaint Neurologic: Sensation intact distally Psychiatric: Patient is competent for consent with normal mood and affect Lymphatic: No axillary or cervical lymphadenopathy  MUSCULOSKELETAL: Right Hip: No pain with  logroll test and minimal discomfort with stinchfield test. He is able to flex his hip to 90 degrees and IR to 30 degrees and ER to 50 degrees with minimal discomfort. He has a negative SLR and crossed SLR test. He is nontender to palpation along the greater trochanter. He has SILT in all nerve distributions distally in his RLE.  Assessment/Plan: 64 y/o male with radiographic hip osteoarthritis and insidious onset of right hip pain. This pain could be the result of an arthritic flare. There is no clinical and laboratory evidence to suggest infection. He has no fracture visible on xray nor does he have any history of trauma to suggest a fracture. He may have an occult stress fracture that is not visible on plain xrays. MRI would be ideal to rule out a stress fracture, however, if patient is too obese for the MRI machine, then a CT scan would be an alternative to look for occult fracture, even though clinical suspicion is low for fracture at this point. A fluoroscopically-guided hip joint injection would be appropriate for diagnostic and therapeutic purposes moving forward.       05/24/2015 10:41 AM

## 2015-05-24 NOTE — ED Notes (Signed)
This nurse helped ambulate patient with walker in room per MD verbal order.  Pt ambulated with walker to door and back to bed.  Pt states it feels okay putting weight on walker and then sat back down c/o more pain "from straining it" and "I can only do short distances and I'm worried about the 7-8 steps at home".

## 2015-05-24 NOTE — Progress Notes (Signed)
  Clinical Child psychotherapist (CSW) received SNF consult. PT is recommending home health/ outpatient PT. RN Case Manager aware of above. Please consult if future social work needs arise. CSW signing off.  Luke Morgan. LCSWA Clinical Social Work Department [phone] 1:28 PM

## 2015-05-25 ENCOUNTER — Observation Stay: Payer: BC Managed Care – PPO

## 2015-05-25 MED ORDER — INFLUENZA VAC SPLIT QUAD 0.5 ML IM SUSY
0.5000 mL | PREFILLED_SYRINGE | INTRAMUSCULAR | Status: AC
  Administered 2015-05-27: 0.5 mL via INTRAMUSCULAR
  Filled 2015-05-25: qty 0.5

## 2015-05-25 NOTE — Progress Notes (Signed)
Patient resting comfortably in bed this AM. No new complaints. Exam unchanged, still with full active and passive hip ROM, and pain only with standing and weight bearing. Patient with known hip and knee arthritis, and it remains unclear if this represents an arthritic flare, versus a muscular strain, or an insufficiency or occult fracture. The plan was to proceed with a fluoroscopically-guided steroid injection of the hip to see if his pain improves. If not, or his symptoms worsen, he may need to transfer to somewhere he can obtain advanced imaging, as he is too large/heavy for our MRI and CT scanners.

## 2015-05-25 NOTE — Progress Notes (Signed)
Pt complaining of new onset of right calf pain, MD on call notified. Pt had [place] of lower extremity on 12/31 and was negative for DVT. MD with no new orders at this time. Will cont to monitor

## 2015-05-25 NOTE — Progress Notes (Signed)
[facility] - [place] at [facility]   PATIENT NAME: Luke Morgan    MR#:  [id number]  DATE OF BIRTH:  08/26/1950  SUBJECTIVE: Patient admitted for right hip pain with intractable unable to ambulate. No numbness in the legs. Range of motion is normal in the right leg.  He has severe right leg pain and it  Is  8 out of 10 when he tries to get up and sit.   CHIEF COMPLAINT:   Chief Complaint  Patient presents with  . Hip Pain    REVIEW OF SYSTEMS:   Review of Systems  Musculoskeletal:       Pain unable to put weight on it ,   CONSTITUTIONAL: No fever, fatigue or weakness.  EYES: No blurred or double vision.  EARS, NOSE, AND THROAT: No tinnitus or ear pain.  RESPIRATORY: No cough, shortness of breath, wheezing or hemoptysis.  CARDIOVASCULAR: No chest pain, orthopnea, edema.  GASTROINTESTINAL: No nausea, vomiting, diarrhea or abdominal pain.  GENITOURINARY: No dysuria, hematuria.  ENDOCRINE: No polyuria, nocturia,  HEMATOLOGY: No anemia, easy bruising or bleeding SKIN: No rash or lesion. MUSCULOSKELETAL;right hip pain.  NEUROLOGIC: No tingling, numbness, weakness.  PSYCHIATRY: No anxiety or depression.   DRUG ALLERGIES:   Allergies  Allergen Reactions  . Penicillins     Has patient had a PCN reaction causing immediate rash, facial/tongue/throat swelling, SOB or lightheadedness with hypotension: Yes Has patient had a PCN reaction causing severe rash involving mucus membranes or skin necrosis: No Has patient had a PCN reaction that required hospitalization No Has patient had a PCN reaction occurring within the last 10 years: No If all of the above answers are "NO", then may proceed with Cephalosporin use.    VITALS:  Blood pressure 156/98, pulse 94, temperature 98 F (36.7 C), temperature source Oral, resp. rate 18, height 6\' 5"  (1.956 m), weight 186.156 kg (410 lb 6.4 oz), SpO2 96 %.  PHYSICAL EXAMINATION:  GENERAL:  65 y.o.-year-old patient  lying in the bed with no acute distress.  EYES: Pupils equal, round, reactive to light and accommodation. No scleral icterus. Extraocular muscles intact.  HEENT: Head atraumatic, normocephalic. Oropharynx and nasopharynx clear.  NECK:  Supple, no jugular venous distention. No thyroid enlargement, no tenderness.  LUNGS: Normal breath sounds bilaterally, no wheezing, rales,rhonchi or crepitation. No use of accessory muscles of respiration.  CARDIOVASCULAR: S1, S2 normal. No murmurs, rubs, or gallops.  ABDOMEN: Soft, nontender, nondistended. Bowel sounds present. No organomegaly or mass.  EXTREMITIES ;active and passive range of motion is intact.; NEUROLOGIC: Cranial nerves II through XII are intact. Muscle strength 5/5 in all extremities. Sensation intact. Gait not checked.  PSYCHIATRIC: The patient is alert and oriented x 3.  SKIN: No obvious rash, lesion, or ulcer.    LABORATORY PANEL:   CBC  Recent Labs Lab 05/23/15 2047  WBC 11.8*  HGB 16.5  HCT 49.3  PLT 280   ------------------------------------------------------------------------------------------------------------------  Chemistries   Recent Labs Lab 05/24/15 0711  CREATININE 1.04   ------------------------------------------------------------------------------------------------------------------  Cardiac Enzymes No results for input(s): TROPONINI in the last 168 hours. ------------------------------------------------------------------------------------------------------------------  RADIOLOGY:  US Venous Img Lower Unilateral Right  05/24/2015  CLINICAL DATA:  Acute onset of right hip pain of extending to the right knee. Initial encounter. EXAM: RIGHT LOWER EXTREMITY VENOUS DOPPLER ULTRASOUND TECHNIQUE: Gray-scale sonography with graded compression, as well as color Doppler and duplex ultrasound were performed to evaluate the lower extremity deep venous systems from the level of the common  femoral vein and including  the common femoral, femoral, profunda femoral, popliteal and calf veins including the posterior tibial, peroneal and gastrocnemius veins when visible. The superficial great saphenous vein was also interrogated. Spectral Doppler was utilized to evaluate flow at rest and with distal augmentation maneuvers in the common femoral, femoral and popliteal veins. COMPARISON:  None. FINDINGS: Contralateral Common Femoral Vein: Respiratory phasicity is normal and symmetric with the symptomatic side. No evidence of thrombus. Normal compressibility. Common Femoral Vein: No evidence of thrombus. Normal compressibility, respiratory phasicity and response to augmentation. Saphenofemoral Junction: No evidence of thrombus. Normal compressibility and flow on color Doppler imaging. Profunda Femoral Vein: No evidence of thrombus. Normal compressibility and flow on color Doppler imaging. Femoral Vein: No evidence of thrombus. Normal compressibility, respiratory phasicity and response to augmentation. Popliteal Vein: No evidence of thrombus. Normal compressibility, respiratory phasicity and response to augmentation. Calf Veins: No evidence of thrombus. Normal compressibility and flow on color Doppler imaging. Superficial Great Saphenous Vein: No evidence of thrombus. Normal compressibility and flow on color Doppler imaging. Venous Reflux:  None. Other Findings:  None. IMPRESSION: No evidence of deep venous thrombosis. Electronically Signed   By: Roanna Raider M.D.   On: 05/24/2015 00:18   Dg Hip Unilat With Pelvis 2-3 Views Right  05/23/2015  CLINICAL DATA:  Initial encounter for several day history of right hip pain after strenuous walking. EXAM: DG HIP (WITH OR WITHOUT PELVIS) 2-3V RIGHT COMPARISON:  None. FINDINGS: Frontal pelvis shows no fracture. SI joints and symphysis pubis are unremarkable. AP and frog-leg lateral views of the right hip show no evidence for femoral neck fracture. Joint space in the hip is relatively well  preserved without substantial degenerative change. No worrisome lytic or sclerotic osseous abnormality. IMPRESSION: Negative. Electronically Signed   By: Kennith Center M.D.   On: 05/23/2015 21:35    EKG:  No orders found for this or any previous visit.  ASSESSMENT AND PLAN:  : Right hip pain: Due to arthritis flare. Unable to ambulate and unable to bear weight:Kenalog injection to the right hip yesterday in the ER, Continue Percocet.. Patient to have a to have a CT or MRI here. Plan to have fluoroscopically  guided steroid injection of the hip to see if pain improves. Likely by tomorrow. And in the physical therapy evaluation. Unable to discharge him because patient is still having a lot of pain even with minimal ambulation. #2 hypertension controlled #3 asthma stable.  4. morbid obesity: BMI 41. All the records are reviewed and case discussed with Care Management/Social Workerr. Management plans discussed with the patient, family and they are in agreement.  CODE STATUS: Full  TOTAL TIME TAKING CARE OF THIS PATIENT: 30 minutes.   POSSIBLE D/C IN 1-2 DAYS, DEPENDING ON CLINICAL CONDITION.   Katha Hamming M.D on 05/25/2015 at 11:47 AM  Between 7am to 6pm - Pager - [phone]  After 6pm go to [email or web address] - password [email or web address]  [facility]  Office  [phone]  CC: Primary care physician; Barbette Reichmann, MD   Note: This dictation was prepared with Dragon dictation along with smaller phrase technology. Any transcriptional errors that result from this process are unintentional.

## 2015-05-26 LAB — URINALYSIS COMPLETE WITH MICROSCOPIC (ARMC ONLY)
BACTERIA UA: NONE SEEN
Bilirubin Urine: NEGATIVE
Glucose, UA: NEGATIVE mg/dL
HGB URINE DIPSTICK: NEGATIVE
LEUKOCYTES UA: NEGATIVE
Nitrite: NEGATIVE
PH: 6 (ref 5.0–8.0)
PROTEIN: NEGATIVE mg/dL
SPECIFIC GRAVITY, URINE: 1.023 (ref 1.005–1.030)
SQUAMOUS EPITHELIAL / LPF: NONE SEEN

## 2015-05-26 MED ORDER — KETOROLAC TROMETHAMINE 15 MG/ML IJ SOLN
30.0000 mg | Freq: Four times a day (QID) | INTRAMUSCULAR | Status: DC | PRN
  Administered 2015-05-26: 30 mg via INTRAVENOUS
  Filled 2015-05-26: qty 2

## 2015-05-26 MED ORDER — MOMETASONE FURO-FORMOTEROL FUM 100-5 MCG/ACT IN AERO: 2.0000 | INHALATION_SPRAY | Freq: Two times a day (BID) | RESPIRATORY_TRACT | Status: DC | PRN

## 2015-05-26 MED ORDER — NYSTATIN 100000 UNIT/GM EX CREA
TOPICAL_CREAM | Freq: Two times a day (BID) | CUTANEOUS | Status: DC
  Administered 2015-05-26: 1 via TOPICAL
  Administered 2015-05-26: 11:00:00 via TOPICAL
  Filled 2015-05-26: qty 15

## 2015-05-26 NOTE — Progress Notes (Signed)
Physical Therapy Treatment Patient Details Name: Luke Morgan MRN: [id number] DOB: January 17, 1951 Today's Date: 05/26/2015    History of Present Illness 65 yo male with onset of R hip pain and limited tolerance for mobility esp to sit or stand/walk, best with supine    PT Comments    Patient demonstrates no deficits in bed mobility and is able to complete independently, quickly, and smoothly with no apparent change in symptoms. Patient reports his most pronounced symptoms occur when he is sitting at the edge of the bed, pain decreases with supine and ambulation. He points to the region between greater trochanter and gluteal musculature when asked about pain region. Patient provided with manual overpressure for piriformis stretch, though no immediate effects were noted by patient. He states his pain with ambulation appears to be lessened from admission, though still bothering him. This appears to be more soft tissue in origin as weight bearing actually reduces the pain he describes in sitting. Given his decline in mobility and acute pain, HHPT would be appropriate for patient after discharge to increase his independence with mobility.  Follow Up Recommendations  Home health PT     Equipment Recommendations  Rolling walker with 5" wheels (Bariatric )    Recommendations for Other Services       Precautions / Restrictions Precautions Precautions: None Restrictions Weight Bearing Restrictions: No    Mobility  Bed Mobility Overal bed mobility: Modified Independent             General bed mobility comments: HOB elevated, able to easily maneuver in the bed with no notable pain increase or deficit.   Transfers Overall transfer level: Modified independent Equipment used: Rolling walker (2 wheeled)             General transfer comment: Patient demonstrates appropriate speed and mechanics for sit to stand transfer, no notable deficits.   Ambulation/Gait Ambulation/Gait  assistance: Supervision Ambulation Distance (Feet):  (60' on 1st bout, 30' on 2nd bout) Assistive device: Rolling walker (2 wheeled) Gait Pattern/deviations: Step-through pattern;Wide base of support   Gait velocity interpretation: Below normal speed for age/gender General Gait Details: No obvious deficits in stance time, stride length noted. Gait appears to be reciprocal with no obvious antaglia, no loss of balance. Patient reports ambulating is tolerable, he is experiencing the most pain while sitting.    Stairs            Wheelchair Mobility    Modified Rankin (Stroke Patients Only)       Balance Overall balance assessment: Modified Independent                                  Cognition Arousal/Alertness: Awake/alert Behavior During Therapy: WFL for tasks assessed/performed;Anxious;Impulsive Overall Cognitive Status: Within Functional Limits for tasks assessed                      Exercises Other Exercises Other Exercises: Manually assisted piriformis stretch x 30" for 3 sets with manual overpressure. Patient denied changes in symptoms with stretch (in supine).     General Comments General comments (skin integrity, edema, etc.): No palpable defomities in R hip, no location of significant tenderness. Reports pain is "deep".       Pertinent Vitals/Pain Pain Assessment: 0-10 Pain Score: 5  (Worst with sitting, improved with supine and standing/ambulation. Around greater trochanter and radiating towards gluteal musculature.) Pain Location: R hip Pain  Descriptors / Indicators: Aching Pain Intervention(s): Limited activity within patient's tolerance;Monitored during session;Premedicated before session    Home Living                      Prior Function            PT Goals (current goals can now be found in the care plan section) Acute Rehab PT Goals Patient Stated Goal: to get the pain better in R hip PT Goal Formulation: With  patient Time For Goal Achievement: 06/07/15 Potential to Achieve Goals: Good Progress towards PT goals: Progressing toward goals    Frequency  Min 2X/week    PT Plan Current plan remains appropriate    Co-evaluation             End of Session Equipment Utilized During Treatment: Gait belt Activity Tolerance: Patient tolerated treatment well;Patient limited by pain;Patient limited by fatigue Patient left: in bed;with call bell/phone within reach     Time: 1333-1355 PT Time Calculation (min) (ACUTE ONLY): 22 min  Charges:  $Gait Training: 8-22 mins                    G Codes:      Kerin Ransom, PT, DPT    05/26/2015, 3:08 PM

## 2015-05-26 NOTE — Progress Notes (Signed)
PROGRESS NOTE  PATIENT NAME: Luke Morgan DOB: 05-Jan-1951  MRN: [id number]    Subjective: After reviewing the chart, patient has onset of right hip pain that is insidious. Plan at this point is to treat him as though he has osteoarthritis of the right hip that is acute. Because of his obesity and MRI is unable to be obtained to look for any occult fracturing. At this time he will receive an injection of corticosteroid into the right hip to see if he has relief of pain. I had a discussion with the patient and reviewed his history of present illness. I agree with Dr. Katy Apo findings.  Objective: Vital signs in last 24 hours: Temp:  [97.7 F (36.5 C)-98.8 F (37.1 C)] 97.7 F (36.5 C) (01/02 0752) Pulse Rate:  [98-108] 98 (01/02 0752) Resp:  [16-18] 18 (01/02 0752) BP: (136-158)/(77-110) 155/86 mmHg (01/02 0854) SpO2:  [95 %-96 %] 96 % (01/02 0752) Weight:  [184.705 kg (407 lb 3.2 oz)] 184.705 kg (407 lb 3.2 oz) (01/02 0230)  Intake/Output from previous day: 01/01 0701 - 01/02 0700 In: 3 [I.V.:3] Out: 1475 [Urine:1475]   Recent Labs  05/23/15 2047 05/24/15 0711  WBC 11.8*  --   HGB 16.5  --   HCT 49.3  --   PLT 280  --   CREATININE  --  1.04    EXAM General - right lower extremity with some evidence of vascular skin changes distally but otherwise skin is intact. He is neurovascularly intact distally but does note a mild amount of pain radiating around the medial aspect of his leg He is able to fully range his hip but has pain with ambulation. He does not have pain with log roll. 2+ distal pulses Positive dorsiflexion and plantarflexion with 5 out of 5 strength.  Assessment: 66 year old male with insidious onset of right hip pain nonsuspicious for infection of the right hip or hip fracture.  Plan:  recommend physical therapy to mobilize the patient  recommend Toradol 60 mg IV to be given with fluids, we'll default to medicine team to deliver medication as it does  portend a risk to kidney issues.  recommend injection as prior plan. recommend DVT prophylaxis with SCDs. Follow up as needed as an outpatient.    Pietro Cassis, MD

## 2015-05-26 NOTE — Progress Notes (Signed)
Urine dark concnetrated with mild odor. Pt encouraged to drink more water. Dr Luberta Mutter notified to obtain u/a

## 2015-05-26 NOTE — Care Management Note (Addendum)
Case Management Note  Patient Details  Name: Luke Morgan MRN: [id number] Date of Birth: 1950/12/20  Subjective/Objective:     Discussed discharge planning with Luke Morgan. In anticipation of possible home health orders, Luke Morgan was given a list of  home health providers from which to choose  IF home health PT is ordered.  Luke Morgan chose [facility] to be his home health provider. Luke Morgan at [facility] was notified. [facility] is recommending home health PT. Luke Morgan has a rolling walker at home.  Case management will follow for discharge planning.               Action/Plan:   Expected Discharge Date:                  Expected Discharge Plan:     In-House Referral:     Discharge planning Services     Post Acute Care Choice:    Choice offered to:     DME Arranged:    DME Agency:     HH Arranged:    HH Agency:     Status of Service:     Medicare Important Message Given:    Date Medicare IM Given:    Medicare IM give by:    Date Additional Medicare IM Given:    Additional Medicare Important Message give by:     If discussed at Long Length of Stay Meetings, dates discussed:    Additional Comments:  Vesna Kable A, RN 05/26/2015, 11:31 AM

## 2015-05-26 NOTE — Progress Notes (Signed)
[facility] - [place] at [facility]   PATIENT NAME: Luke Morgan    MR#:  [id number]  DATE OF BIRTH:  24-Jan-1951  SUBJECTIVE: Patient admitted for right hip pain with intractable unable to ambulate. Has pain in the right leg and unable to ambulate. Not better from yesterday.   CHIEF COMPLAINT:   Chief Complaint  Patient presents with  . Hip Pain    REVIEW OF SYSTEMS:   Review of Systems  Musculoskeletal:       Pain unable to put weight on it ,   CONSTITUTIONAL: No fever, fatigue or weakness.  EYES: No blurred or double vision.  EARS, NOSE, AND THROAT: No tinnitus or ear pain.  RESPIRATORY: No cough, shortness of breath, wheezing or hemoptysis.  CARDIOVASCULAR: No chest pain, orthopnea, edema.  GASTROINTESTINAL: No nausea, vomiting, diarrhea or abdominal pain.  GENITOURINARY: No dysuria, hematuria.  ENDOCRINE: No polyuria, nocturia,  HEMATOLOGY: No anemia, easy bruising or bleeding SKIN: No rash or lesion. MUSCULOSKELETAL;right hip pain.  NEUROLOGIC: No tingling, numbness, weakness.  PSYCHIATRY: No anxiety or depression.   DRUG ALLERGIES:   Allergies  Allergen Reactions  . Penicillins     Has patient had a PCN reaction causing immediate rash, facial/tongue/throat swelling, SOB or lightheadedness with hypotension: Yes Has patient had a PCN reaction causing severe rash involving mucus membranes or skin necrosis: No Has patient had a PCN reaction that required hospitalization No Has patient had a PCN reaction occurring within the last 10 years: No If all of the above answers are "NO", then may proceed with Cephalosporin use.    VITALS:  Blood pressure 155/86, pulse 98, temperature 97.7 F (36.5 C), temperature source Oral, resp. rate 18, height 6\' 5"  (1.956 m), weight 184.705 kg (407 lb 3.2 oz), SpO2 96 %.  PHYSICAL EXAMINATION:  GENERAL:  65 y.o.-year-old patient lying in the bed with no acute distress.  EYES: Pupils equal, round, reactive  to light and accommodation. No scleral icterus. Extraocular muscles intact.  HEENT: Head atraumatic, normocephalic. Oropharynx and nasopharynx clear.  NECK:  Supple, no jugular venous distention. No thyroid enlargement, no tenderness.  LUNGS: Normal breath sounds bilaterally, no wheezing, rales,rhonchi or crepitation. No use of accessory muscles of respiration.  CARDIOVASCULAR: S1, S2 normal. No murmurs, rubs, or gallops.  ABDOMEN: Soft, nontender, nondistended. Bowel sounds present. No organomegaly or mass.  EXTREMITIES ;active and passive range of motion is intact.; NEUROLOGIC: Cranial nerves II through XII are intact. Muscle strength 5/5 in all extremities. Sensation intact. Gait not checked.  PSYCHIATRIC: The patient is alert and oriented x 3.  SKIN: No obvious rash, lesion, or ulcer.    LABORATORY PANEL:   CBC  Recent Labs Lab 05/23/15 2047  WBC 11.8*  HGB 16.5  HCT 49.3  PLT 280   ------------------------------------------------------------------------------------------------------------------  Chemistries   Recent Labs Lab 05/24/15 0711  CREATININE 1.04   ------------------------------------------------------------------------------------------------------------------  Cardiac Enzymes No results for input(s): TROPONINI in the last 168 hours. ------------------------------------------------------------------------------------------------------------------  RADIOLOGY:  No results found.  EKG:  No orders found for this or any previous visit.  ASSESSMENT AND PLAN:  : Right hip pain: Due to arthritis flare. Unable to ambulate and unable to bear weight:Kenalog injection to the right hip yesterday in the ER, Continue Percocet.. Patient to have a to have a CT or MRI here. Plan to have fluoroscopically  guided steroid injection of the hip to see if pain improves. Likely by tomorrow. And in the physical therapy evaluation. Unable to discharge him  because patient is still  having a lot of pain even with minimal ambulation. Start  toradol injections. #2 hypertension controlled #3 asthma stable.  4. morbid obesity: BMI 41. All the records are reviewed and case discussed with Care Management/Social Workerr. Management plans discussed with the patient, family and they are in agreement.  CODE STATUS: Full  TOTAL TIME TAKING CARE OF THIS PATIENT: 30 minutes.   POSSIBLE D/C IN 1-2 DAYS, DEPENDING ON CLINICAL CONDITION.   Katha Hamming M.D on 05/26/2015 at 1:00 PM  Between 7am to 6pm - Pager - [phone]  After 6pm go to [email or web address] - password [email or web address]  [facility]  Office  [phone]  CC: Primary care physician; Barbette Reichmann, MD   Note: This dictation was prepared with Dragon dictation along with smaller phrase technology. Any transcriptional errors that result from this process are unintentional.

## 2015-05-26 NOTE — Progress Notes (Signed)
Pt refuses  Dulera. States he does not take consistently . Yeast  On buttock skin folds at coccyx area. Dr Luberta Mutter notified macular degeneration orders dulera prn and nystatin cream bid

## 2015-05-27 MED ORDER — SENNA 8.6 MG PO TABS: 1.0000 | ORAL_TABLET | Freq: Two times a day (BID) | ORAL | Status: DC

## 2015-05-27 MED ORDER — DOCUSATE SODIUM 100 MG PO CAPS: 100.0000 mg | ORAL_CAPSULE | Freq: Two times a day (BID) | ORAL | Status: DC

## 2015-05-27 MED ORDER — METAXALONE 800 MG PO TABS: 800.0000 mg | ORAL_TABLET | Freq: Three times a day (TID) | ORAL | Status: DC

## 2015-05-27 MED ORDER — OXYCODONE-ACETAMINOPHEN 5-325 MG PO TABS: 2.0000 | ORAL_TABLET | Freq: Four times a day (QID) | ORAL | Status: DC | PRN

## 2015-05-27 NOTE — Progress Notes (Signed)
Spoke to Dr. Eston Esters nurse Morrie Sheldon at [facility].  She said Dr. Marcello Fennel is referring him to [facility] ortho and will be making the follow up appointment for him.  If needed the ortho doctor at [facility] will schedule the steroid injection.  Pt ambulating around room and getting out of bed with NO ASSISTANCE.  Pt stated he only has pain when he sits on the side of the bed.

## 2015-05-27 NOTE — Progress Notes (Signed)
Pt states he does not want to take enema.  He said he hasn't had a BM because he has not been eating very much here.  Pt states he doesn't have an issue with BMs and will be able to go when he get home.  Advised patient of ordered docusate called in to pharmacy.

## 2015-05-27 NOTE — Progress Notes (Signed)
rn spoke with dr Luberta Mutter. Pt upset because he feels like he was ordered to have steroid injection but never received therapy. rn notified dr Luberta Mutter that pt was unhappy that he did not receive injection while  Admitted at the hospital . md reports he will call radiology

## 2015-05-27 NOTE — Care Management (Signed)
Patient updated CM that he does not have a RW in the home.  Order was placed by MD, and [facility] from [facility] notified.  Will deliver to room.

## 2015-05-27 NOTE — Progress Notes (Signed)
Pt. Alert and oriented. VSS. Pain controlled with PO pain meds. Rested quietly throughout the night til about 0400 when he requested 2 pain pills. Using urinal. CPAP at night . Will continue to monitor.

## 2015-05-27 NOTE — Discharge Summary (Signed)
Luke Morgan, is a 65 y.o. male  DOB 1951-04-07  MRN [id number].  Admission date:  05/23/2015  Admitting Physician  Milagros Loll, MD  Discharge Date:  05/27/2015   Primary MD  Barbette Reichmann, MD  Recommendations for primary care physician for things to follow:  Follow up with Dr.Hande in one week folow up with [facility] ortho after follow up with Dr.Hande    Admission Diagnosis  Hip pain, right [M25.551]   Discharge Diagnosis  Hip pain, right [M25.551]    Active Problems:   Hip pain      Past Medical History  Diagnosis Date  . Hypertension   . Kidney stones   . Asthma   . Osteoarthritis   . Obesity     History reviewed. No pertinent past surgical history.     History of present illness and  Hospital Course:     Kindly see H&P for history of present illness and admission details, please review complete Labs, Consult reports and Test reports for all details in brief  HPI  from the history and physical done on the day of admission Luke Morgan is a 65 y.o. male with a known history of asthma, osteoarthritis, hypertension presents to the emergency room due to worsening right hip pain. Patient has had right hip pain chronically but has worsened over the last week to the point that he is unable to bear weight on right leg. Patient lives alone. In spite of steroid injection and pain medications patient is unable to walk and is being admitted to the hospital under observation. He did have steroid injections into the knees recently which have helped. He initially called his orthopedic doctor who requested patient come into the office. But patient due to worsening pain and presented to the emergency room. X-ray showed no fracture.   Hospital Course  1.Right hip pain ;intractable;pt pain was there for 3 days ,had to  give him iV morphine,toradol.seen by ortho pedics,received steroid injection in ER.pt unable to get CT or MRI of hip due to his weight.seen by ortho,PT>pt had lot of trouble getting up from bed,ROM were normal.his pain slowly improved and able to walk with out assistance in the room.so we called DR.hande who will set up ortho appointment for fluroscopically guided steroid ijection to right hip.  Discharged home with norco and skelaxin.also home PT> 2.chronic gout;stable 3.htn;controlled,   Discharge Condition: stable   Follow UP Follow up with Dr.Hande     Discharge Instructions  and  Discharge Medications    Discharge Instructions    DME Other see comment    Complete by:  As directed   Bariatric Walker     Face-to-face encounter (required for Medicare/Medicaid patients)    Complete by:  As directed   I Brazen Domangue certify that this patient is under my care and that I, or a nurse practitioner or physician's assistant working with me, had a face-to-face encounter that meets the physician face-to-face encounter requirements with this patient on 05/27/2015. The encounter with the patient was in whole, or in part for the following medical condition(s) which is the primary reason for home health care ( Right hip pain Ambulatory difficulties  The encounter with the patient was in whole, or in part, for the following medical condition, which is the primary reason for home health care:  whole  I certify that, based on my findings, the following services are medically necessary home health services:  Physical therapy  Reason for Medically Necessary Home Health  Services:  Therapy- Gait Training, Patent examiner  My clinical findings support the need for the above services:  Pain interferes with ambulation/mobility  Further, I certify that my clinical findings support that this patient is homebound due to:  Ambulates short distances less than 300 feet     Home Health     Complete by:  As directed   To provide the following care/treatments:  PT            Medication List    TAKE these medications        allopurinol 300 MG tablet  Commonly known as:  ZYLOPRIM  Take 300 mg by mouth daily.     aspirin EC 81 MG tablet  Take 81 mg by mouth daily.     docusate sodium 100 MG capsule  Commonly known as:  COLACE  Take 1 capsule (100 mg total) by mouth 2 (two) times daily.     Fluticasone-Salmeterol 100-50 MCG/DOSE Aepb  Commonly known as:  ADVAIR  Inhale 1 puff into the lungs 2 (two) times daily.     hydrALAZINE 50 MG tablet  Commonly known as:  APRESOLINE  Take 50 mg by mouth 3 (three) times daily.     losartan-hydrochlorothiazide 100-25 MG tablet  Commonly known as:  HYZAAR  Take 1 tablet by mouth daily.     metaxalone 800 MG tablet  Commonly known as:  SKELAXIN  Take 1 tablet (800 mg total) by mouth 3 (three) times daily.     oxyCODONE-acetaminophen 5-325 MG tablet  Commonly known as:  ROXICET  Take 2 tablets by mouth every 6 (six) hours as needed.          Diet and Activity recommendation: See Discharge Instructions above   Consults obtained -ortho   Major procedures and Radiology Reports - PLEASE review detailed and final reports for all details, in brief -     US Venous Img Lower Unilateral Right  05/24/2015  CLINICAL DATA:  Acute onset of right hip pain of extending to the right knee. Initial encounter. EXAM: RIGHT LOWER EXTREMITY VENOUS DOPPLER ULTRASOUND TECHNIQUE: Gray-scale sonography with graded compression, as well as color Doppler and duplex ultrasound were performed to evaluate the lower extremity deep venous systems from the level of the common femoral vein and including the common femoral, femoral, profunda femoral, popliteal and calf veins including the posterior tibial, peroneal and gastrocnemius veins when visible. The superficial great saphenous vein was also interrogated. Spectral Doppler was utilized to evaluate  flow at rest and with distal augmentation maneuvers in the common femoral, femoral and popliteal veins. COMPARISON:  None. FINDINGS: Contralateral Common Femoral Vein: Respiratory phasicity is normal and symmetric with the symptomatic side. No evidence of thrombus. Normal compressibility. Common Femoral Vein: No evidence of thrombus. Normal compressibility, respiratory phasicity and response to augmentation. Saphenofemoral Junction: No evidence of thrombus. Normal compressibility and flow on color Doppler imaging. Profunda Femoral Vein: No evidence of thrombus. Normal compressibility and flow on color Doppler imaging. Femoral Vein: No evidence of thrombus. Normal compressibility, respiratory phasicity and response to augmentation. Popliteal Vein: No evidence of thrombus. Normal compressibility, respiratory phasicity and response to augmentation. Calf Veins: No evidence of thrombus. Normal compressibility and flow on color Doppler imaging. Superficial Great Saphenous Vein: No evidence of thrombus. Normal compressibility and flow on color Doppler imaging. Venous Reflux:  None. Other Findings:  None. IMPRESSION: No evidence of deep venous thrombosis. Electronically Signed   By: Beryle Beams.Investment banker, operationalD.  On: 05/24/2015 00:18   Dg Hip Unilat With Pelvis 2-3 Views Right  05/23/2015  CLINICAL DATA:  Initial encounter for several day history of right hip pain after strenuous walking. EXAM: DG HIP (WITH OR WITHOUT PELVIS) 2-3V RIGHT COMPARISON:  None. FINDINGS: Frontal pelvis shows no fracture. SI joints and symphysis pubis are unremarkable. AP and frog-leg lateral views of the right hip show no evidence for femoral neck fracture. Joint space in the hip is relatively well preserved without substantial degenerative change. No worrisome lytic or sclerotic osseous abnormality. IMPRESSION: Negative. Electronically Signed   By: Kennith Center M.D.   On: 05/23/2015 21:35    Micro Results     No results found for this or any  previous visit (from the past 240 hour(s)).     Today   Subjective:   Isami Toro today has no headache,no chest abdominal pain,no new weakness tingling or numbness, feels much better wants to go home today.   Objective:   Blood pressure 144/91, pulse 110, temperature 98.4 F (36.9 C), temperature source Axillary, resp. rate 17, height 6\' 5"  (1.956 m), weight 183.843 kg (405 lb 4.8 oz), SpO2 96 %.   Intake/Output Summary (Last 24 hours) at 05/27/15 1731 Last data filed at 05/27/15 0412  Gross per 24 hour  Intake      0 ml  Output    525 ml  Net   -525 ml    Exam Awake Alert, Oriented x 3, No new F.N deficits, Normal affect [place].AT,PERRAL Supple Neck,No JVD, No cervical lymphadenopathy appriciated.  Symmetrical Chest wall movement, Good air movement bilaterally, CTAB RRR,No Gallops,Rubs or new Murmurs, No Parasternal Heave +ve B.Sounds, Abd Soft, Non tender, No organomegaly appriciated, No rebound -guarding or rigidity. No Cyanosis, Clubbing or edema, No new Rash or bruise  Data Review   CBC w Diff: Lab Results  Component Value Date   WBC 11.8* 05/23/2015   HGB 16.5 05/23/2015   HCT 49.3 05/23/2015   PLT 280 05/23/2015    CMP: Lab Results  Component Value Date   CREATININE 1.04 05/24/2015  .   Total Time in preparing paper work, data evaluation and todays exam - 35 minutes  Derk Doubek M.D on 05/27/2015 at 5:31 PM    Note: This dictation was prepared with Dragon dictation along with smaller phrase technology. Any transcriptional errors that result from this process are unintentional.

## 2015-05-27 NOTE — Care Management (Signed)
Patient to discharge today.  Patient has selected [facility] for discharge.  Order and face to face is in.  Barbara Cower from [facility] notified of discharge.

## 2015-05-30 ENCOUNTER — Other Ambulatory Visit: Payer: Self-pay | Admitting: Internal Medicine

## 2015-05-30 DIAGNOSIS — M25551 Pain in right hip: Secondary | ICD-10-CM

## 2015-06-06 ENCOUNTER — Ambulatory Visit
Admission: RE | Admit: 2015-06-06 | Discharge: 2015-06-06 | Disposition: A | Discharge status: Home / Self Care | Payer: BC Managed Care – PPO | Source: Ambulatory Visit | Attending: Internal Medicine | Admitting: Internal Medicine

## 2015-06-06 DIAGNOSIS — M25551 Pain in right hip: Secondary | ICD-10-CM | POA: Insufficient documentation

## 2015-06-06 MED ORDER — IOHEXOL 300 MG/ML  SOLN
10.0000 mL | Freq: Once | INTRAMUSCULAR | Status: AC | PRN
  Administered 2015-06-06: 10 mL via INTRA_ARTICULAR

## 2015-06-06 MED ORDER — METHYLPREDNISOLONE ACETATE 80 MG/ML IJ SUSP
80.0000 mg | Freq: Once | INTRAMUSCULAR | Status: AC
  Administered 2015-06-06: 80 mg via INTRA_ARTICULAR
  Filled 2015-06-06: qty 1

## 2015-06-06 MED ORDER — BUPIVACAINE HCL (PF) 0.25 % IJ SOLN
7.0000 mL | Freq: Once | INTRAMUSCULAR | Status: AC
  Administered 2015-06-06: 7 mL via INTRA_ARTICULAR
  Filled 2015-06-06: qty 10

## 2016-03-31 ENCOUNTER — Other Ambulatory Visit: Payer: Self-pay | Admitting: Internal Medicine

## 2016-03-31 DIAGNOSIS — I10 Essential (primary) hypertension: Secondary | ICD-10-CM

## 2016-03-31 DIAGNOSIS — Z0000 Encounter for general adult medical examination without abnormal findings: Secondary | ICD-10-CM

## 2016-03-31 DIAGNOSIS — M170 Bilateral primary osteoarthritis of knee: Secondary | ICD-10-CM

## 2016-03-31 DIAGNOSIS — G4733 Obstructive sleep apnea (adult) (pediatric): Secondary | ICD-10-CM

## 2016-03-31 DIAGNOSIS — E782 Mixed hyperlipidemia: Secondary | ICD-10-CM

## 2016-03-31 DIAGNOSIS — M792 Neuralgia and neuritis, unspecified: Secondary | ICD-10-CM

## 2016-04-09 ENCOUNTER — Encounter (INDEPENDENT_AMBULATORY_CARE_PROVIDER_SITE_OTHER): Payer: Self-pay

## 2016-04-09 ENCOUNTER — Ambulatory Visit
Admission: RE | Admit: 2016-04-09 | Discharge: 2016-04-09 | Disposition: A | Discharge status: Home / Self Care | Payer: BC Managed Care – PPO | Source: Ambulatory Visit | Attending: Internal Medicine | Admitting: Internal Medicine

## 2016-04-09 DIAGNOSIS — G4733 Obstructive sleep apnea (adult) (pediatric): Secondary | ICD-10-CM

## 2016-04-09 DIAGNOSIS — M170 Bilateral primary osteoarthritis of knee: Secondary | ICD-10-CM

## 2016-04-09 DIAGNOSIS — M48061 Spinal stenosis, lumbar region without neurogenic claudication: Secondary | ICD-10-CM | POA: Insufficient documentation

## 2016-04-09 DIAGNOSIS — E782 Mixed hyperlipidemia: Secondary | ICD-10-CM

## 2016-04-09 DIAGNOSIS — M5126 Other intervertebral disc displacement, lumbar region: Secondary | ICD-10-CM | POA: Diagnosis not present

## 2016-04-09 DIAGNOSIS — Z0000 Encounter for general adult medical examination without abnormal findings: Secondary | ICD-10-CM

## 2016-04-09 DIAGNOSIS — M792 Neuralgia and neuritis, unspecified: Secondary | ICD-10-CM | POA: Diagnosis present

## 2016-04-09 DIAGNOSIS — I10 Essential (primary) hypertension: Secondary | ICD-10-CM

## 2016-04-09 DIAGNOSIS — M5136 Other intervertebral disc degeneration, lumbar region: Secondary | ICD-10-CM | POA: Insufficient documentation

## 2016-04-09 MED ORDER — GADOBENATE DIMEGLUMINE 529 MG/ML IV SOLN
20.0000 mL | Freq: Once | INTRAVENOUS | Status: AC | PRN
  Administered 2016-04-09: 10 mL via INTRAVENOUS

## 2016-04-16 ENCOUNTER — Ambulatory Visit (INDEPENDENT_AMBULATORY_CARE_PROVIDER_SITE_OTHER): Payer: BC Managed Care – PPO

## 2016-04-16 ENCOUNTER — Ambulatory Visit
Admission: EM | Admit: 2016-04-16 | Discharge: 2016-04-16 | Disposition: A | Discharge status: Home / Self Care | Payer: BC Managed Care – PPO | Attending: Family Medicine | Admitting: Family Medicine

## 2016-04-16 ENCOUNTER — Encounter: Payer: Self-pay | Admitting: *Deleted

## 2016-04-16 DIAGNOSIS — R05 Cough: Secondary | ICD-10-CM | POA: Diagnosis not present

## 2016-04-16 DIAGNOSIS — J189 Pneumonia, unspecified organism: Secondary | ICD-10-CM

## 2016-04-16 DIAGNOSIS — J4521 Mild intermittent asthma with (acute) exacerbation: Secondary | ICD-10-CM

## 2016-04-16 DIAGNOSIS — R059 Cough, unspecified: Secondary | ICD-10-CM

## 2016-04-16 LAB — CBC WITH DIFFERENTIAL/PLATELET
BASOS PCT: 1 %
Basophils Absolute: 0.1 10*3/uL (ref 0–0.1)
EOS ABS: 0.2 10*3/uL (ref 0–0.7)
EOS PCT: 2 %
HCT: 48.5 % (ref 40.0–52.0)
Hemoglobin: 16.2 g/dL (ref 13.0–18.0)
LYMPHS ABS: 1.1 10*3/uL (ref 1.0–3.6)
Lymphocytes Relative: 12 %
MCH: 29 pg (ref 26.0–34.0)
MCHC: 33.4 g/dL (ref 32.0–36.0)
MCV: 86.7 fL (ref 80.0–100.0)
Monocytes Absolute: 1 10*3/uL (ref 0.2–1.0)
Monocytes Relative: 10 %
NEUTROS PCT: 75 %
Neutro Abs: 6.8 10*3/uL — ABNORMAL HIGH (ref 1.4–6.5)
PLATELETS: 237 10*3/uL (ref 150–440)
RBC: 5.59 MIL/uL (ref 4.40–5.90)
RDW: 14.7 % — ABNORMAL HIGH (ref 11.5–14.5)
WBC: 9.2 10*3/uL (ref 3.8–10.6)

## 2016-04-16 MED ORDER — AZITHROMYCIN 500 MG PO TABS
500.0000 mg | ORAL_TABLET | Freq: Once | ORAL | Status: AC
  Administered 2016-04-16: 500 mg via ORAL

## 2016-04-16 MED ORDER — ALBUTEROL SULFATE HFA 108 (90 BASE) MCG/ACT IN AERS: 1.0000 | INHALATION_SPRAY | Freq: Four times a day (QID) | RESPIRATORY_TRACT | 0 refills | Status: DC | PRN

## 2016-04-16 MED ORDER — AZITHROMYCIN 250 MG PO TABS: 250.0000 mg | ORAL_TABLET | Freq: Every day | ORAL | 0 refills | Status: DC

## 2016-04-16 NOTE — ED Triage Notes (Signed)
Congestion, non-productive cough, and intermittent fever since Monday.

## 2016-04-16 NOTE — ED Provider Notes (Signed)
[facility]    CSN: [id number] Arrival date & time: 04/16/16  1757     History   Chief Complaint Chief Complaint  Patient presents with  . Cough  . Nasal Congestion  . Fever    HPI Luke Morgan is a 65 y.o. male.   The history is provided by the patient.  Cough  Cough characteristics:  Productive Sputum characteristics:  Nondescript Severity:  Mild Duration:  5 days Timing:  Intermittent Progression:  Unchanged Chronicity:  New Smoker: no   Relieved by:  Beta-agonist inhaler and steroid inhaler (advair) Associated symptoms: fever, rhinorrhea and wheezing   Associated symptoms: no chest pain, no chills, no diaphoresis, no headaches, no myalgias, no rash, no shortness of breath, no sinus congestion and no sore throat   Fever:    Temp source:  Subjective   Progression:  Waxing and waning Fever  Temp source:  Subjective Severity:  Moderate Duration:  2 days Timing:  Intermittent Progression:  Waxing and waning Associated symptoms: congestion, cough and rhinorrhea   Associated symptoms: no chest pain, no chills, no confusion, no headaches, no myalgias, no nausea, no rash, no somnolence and no sore throat     Past Medical History:  Diagnosis Date  . Asthma   . Hypertension   . Kidney stones   . Obesity   . Osteoarthritis     Patient Active Problem List   Diagnosis Date Noted  . Hip pain 05/24/2015    History reviewed. No pertinent surgical history.     Home Medications    Prior to Admission medications   Medication Sig Start Date End Date Taking? Authorizing Provider  allopurinol (ZYLOPRIM) 300 MG tablet Take 300 mg by mouth daily.   Yes Historical Provider, MD  aspirin EC 81 MG tablet Take 81 mg by mouth daily.   Yes Historical Provider, MD  Fluticasone-Salmeterol (ADVAIR) 100-50 MCG/DOSE AEPB Inhale 1 puff into the lungs 2 (two) times daily.   Yes Historical Provider, MD  gabapentin (NEURONTIN) 300 MG capsule Take 300 mg by mouth 3  (three) times daily.   Yes Historical Provider, MD  hydrALAZINE (APRESOLINE) 50 MG tablet Take 50 mg by mouth 3 (three) times daily.   Yes Historical Provider, MD  losartan-hydrochlorothiazide (HYZAAR) 100-25 MG tablet Take 1 tablet by mouth daily.   Yes Historical Provider, MD  albuterol (PROVENTIL HFA;VENTOLIN HFA) 108 (90 Base) MCG/ACT inhaler Inhale 1-2 puffs into the lungs every 6 (six) hours as needed for wheezing or shortness of breath. 04/16/16   Duanne Limerick, MD  azithromycin (ZITHROMAX) 250 MG tablet Take 1 tablet (250 mg total) by mouth daily. Take first 2 tablets together, then 1 every day until finished. 04/16/16   Duanne Limerick, MD  docusate sodium (COLACE) 100 MG capsule Take 1 capsule (100 mg total) by mouth 2 (two) times daily. 05/27/15   Katha Hamming, MD  metaxalone (SKELAXIN) 800 MG tablet Take 1 tablet (800 mg total) by mouth 3 (three) times daily. 05/27/15   Katha Hamming, MD  oxyCODONE-acetaminophen (ROXICET) 5-325 MG tablet Take 2 tablets by mouth every 6 (six) hours as needed. 05/27/15   Katha Hamming, MD    Family History History reviewed. No pertinent family history.  Social History Social History  Substance Use Topics  . Smoking status: Never Smoker  . Smokeless tobacco: Never Used  . Alcohol use Yes     Comment: meals     Allergies   Penicillins   Review of Systems  Review of Systems  Constitutional: Positive for fever. Negative for chills, diaphoresis and fatigue.  HENT: Positive for congestion and rhinorrhea. Negative for drooling, hearing loss, postnasal drip, sinus pain, sinus pressure and sore throat.   Respiratory: Positive for cough and wheezing. Negative for chest tightness and shortness of breath.   Cardiovascular: Negative for chest pain and leg swelling.  Gastrointestinal: Negative for nausea.  Musculoskeletal: Negative for myalgias.  Skin: Negative for rash.  Neurological: Negative for headaches.  Psychiatric/Behavioral:  Negative for confusion.     Physical Exam Triage Vital Signs ED Triage Vitals  Enc Vitals Group     BP 04/16/16 1823 (!) 174/105     Pulse Rate 04/16/16 1823 (!) 116     Resp 04/16/16 1823 18     Temp 04/16/16 1823 100.1 F (37.8 C)     Temp Source 04/16/16 1823 Oral     SpO2 04/16/16 1823 96 %     Weight 04/16/16 1828 (!) 410 lb (186 kg)     Height 04/16/16 1828 6\' 5"  (1.956 m)     Head Circumference --      Peak Flow --      Pain Score --      Pain Loc --      Pain Edu? --      Excl. in GC? --    No data found.   Updated Vital Signs BP (!) 174/105 (BP Location: Left Arm)   Pulse (!) 116   Temp 100.1 F (37.8 C) (Oral)   Resp 18   Ht 6\' 5"  (1.956 m)   Wt (!) 410 lb (186 kg)   SpO2 96%   BMI 48.62 kg/m   Visual Acuity Right Eye Distance:   Left Eye Distance:   Bilateral Distance:    Right Eye Near:   Left Eye Near:    Bilateral Near:     Physical Exam  Constitutional: He is oriented to person, place, and time. He appears well-developed and well-nourished.  HENT:  Head: Normocephalic.  Right Ear: External ear normal.  Left Ear: External ear normal.  Nose: Nose normal.  Mouth/Throat: Oropharynx is clear and moist.  Eyes: Conjunctivae and EOM are normal. Pupils are equal, round, and reactive to light. Right eye exhibits no discharge. Left eye exhibits no discharge. No scleral icterus.  Neck: Normal range of motion. Neck supple. No JVD present. No tracheal deviation present. No thyromegaly present.  Cardiovascular: Normal rate, regular rhythm, normal heart sounds and intact distal pulses.  Exam reveals no gallop and no friction rub.   No murmur heard. Pulmonary/Chest: Effort normal and breath sounds normal. No respiratory distress. He has no wheezes. He has no rales.  Abdominal: Soft. Bowel sounds are normal. He exhibits no mass. There is no hepatosplenomegaly. There is no tenderness. There is no rebound, no guarding and no CVA tenderness.  Musculoskeletal:  Normal range of motion. He exhibits no edema or tenderness.  Lymphadenopathy:    He has no cervical adenopathy.  Neurological: He is alert and oriented to person, place, and time. He has normal strength and normal reflexes. No cranial nerve deficit.  Skin: Skin is warm. No rash noted.     UC Treatments / Results  Labs (all labs ordered are listed, but only abnormal results are displayed) Labs Reviewed  CBC WITH DIFFERENTIAL/PLATELET - Abnormal; Notable for the following:       Result Value   RDW 14.7 (*)    Neutro Abs 6.8 (*)  All other components within normal limits    EKG  EKG Interpretation None       Radiology Dg Chest 2 View  Result Date: 04/16/2016 CLINICAL DATA:  Cough and shortness of breath for 4 days EXAM: CHEST  2 VIEW COMPARISON:  None. FINDINGS: Cardiac shadow is within normal limits. The lungs are well aerated bilaterally. No focal infiltrate or sizable effusion is seen. Degenerative changes of thoracic spine are noted. IMPRESSION: No active cardiopulmonary disease. Electronically Signed   By: Alcide Clever M.D.   On: 04/16/2016 19:37    Procedures Procedures (including critical care time)  Medications Ordered in UC Medications - No data to display   Initial Impression / Assessment and Plan / UC Course  I have reviewed the triage vital signs and the nursing notes.  Pertinent labs & imaging results that were available during my care of the patient were reviewed by me and considered in my medical decision making (see chart for details).  Clinical Course       Final Clinical Impressions(s) / UC Diagnoses   Final diagnoses:  Cough  Community acquired pneumonia, unspecified laterality  Mild intermittent reactive airway disease with acute exacerbation    New Prescriptions New Prescriptions   ALBUTEROL (PROVENTIL HFA;VENTOLIN HFA) 108 (90 BASE) MCG/ACT INHALER    Inhale 1-2 puffs into the lungs every 6 (six) hours as needed for wheezing or  shortness of breath.   AZITHROMYCIN (ZITHROMAX) 250 MG TABLET    Take 1 tablet (250 mg total) by mouth daily. Take first 2 tablets together, then 1 every day until finished.     Duanne Limerick, MD 04/16/16 [phone]

## 2016-06-02 ENCOUNTER — Encounter: Payer: Self-pay | Admitting: Physical Therapy

## 2016-06-02 ENCOUNTER — Ambulatory Visit: Payer: BC Managed Care – PPO | Attending: Neurosurgery | Admitting: Physical Therapy

## 2016-06-02 DIAGNOSIS — R262 Difficulty in walking, not elsewhere classified: Secondary | ICD-10-CM

## 2016-06-02 DIAGNOSIS — M6281 Muscle weakness (generalized): Secondary | ICD-10-CM | POA: Diagnosis present

## 2016-06-02 DIAGNOSIS — M5416 Radiculopathy, lumbar region: Secondary | ICD-10-CM | POA: Diagnosis present

## 2016-06-03 NOTE — Therapy (Signed)
[facility] PHYSICAL AND SPORTS MEDICINE [address] [place], [address] Phone: [phone]   Fax:  [phone]  Physical Therapy Evaluation  Patient Details  Name: Luke Morgan MRN: [id number] Date of Birth: [birth year] Referring Provider: Lovell Sheehan  Encounter Date: 06/02/2016      PT End of Session - 06/02/16 1041    Visit Number 1   Number of Visits 9   Date for PT Re-Evaluation 07/01/16   PT Start Time 0958   PT Stop Time 1043   PT Time Calculation (min) 45 min   Activity Tolerance Patient limited by fatigue      Past Medical History:  Diagnosis Date  . Asthma   . Hypertension   . Kidney stones   . Obesity   . Osteoarthritis     History reviewed. No pertinent surgical history.  There were no vitals filed for this visit.       Subjective Assessment - 06/02/16 1007    Subjective Pt denies having pain however states that when he stands for any extended periods of time he notices numbness and tingling throughout his LE. He states that he can only stand for 10 minutes without completely losing sensation in his LE. Symptoms have been occuring for one year and pt states that he has also experienced bilateral hip and knee pain during that time. Pt expresses interest in beginning a weight loss program and believes that with weight loss he could decrease his numbness and tingling down his legs as well as his hip and knee pain. 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 Pt denies any regular exercise and states that his surgeon believes he needs surgery and PT prior is needed to meet insurance requirements.   Pertinent History Back pain   Limitations Standing   How long can you sit comfortably? No limitations   How long can you stand comfortably? 10 minutes   Diagnostic tests See MRI report, multiple levels stenosis and disc bulge  at L5   Patient Stated Goals Be able to stand and walk for longer durations of time, without LE numbness and tingling.   Currently in Pain? No/denies   Multiple Pain Sites No         Objective:  Instructed pt on standing posterior pelvic tilt exercise to facilitate improved posture and increase abdominal activation. Pt corrected on lumbar extension, emphasized pelvic motion. Required verbal and tactile cues to perform. 10x. Some difficulty with performance, pt would benefit from reinstruction at next session as well as progression of core training exercises. Instructed on performing exercise as needed during the day when expected to stand for long periods of time.                       PT Education - 06/02/16 1044    Education provided Yes   Education Details Course of PT   Person(s) Educated Patient   Methods Explanation   Comprehension Verbalized understanding             PT Long Term Goals - 06/03/16 0827      PT LONG TERM GOAL #1   Title Pt will be able to comfortably stand unsupported without N/T for 20 minutes to complete ADLs   Baseline Pt able to stand for 5 min before experiencing N/T or unsteadiness   Time 4   Period  Weeks   Status New     PT LONG TERM GOAL #2   Title Pt will increase walking distance to 1500' without symptoms, or requiring a need for a break     Baseline Pt able to walk 1000' feet, with SOB at end    Time 4   Period Weeks   Status New     PT LONG TERM GOAL #3   Title Pt will be I with HEP to resume personal and recreational activities    Baseline Pt not yet Independent   Time 4   Period Weeks   Status New               Plan - 06/02/16 1045    Clinical Impression Statement Pt is a pleasant 65 y/o old male who presents to the clinic with complaints of LE numbness and tingling and an MRI report of lumbar spinal stenosis from MD. Pt has reduced functional strength and endurance to perform activities related to  theater and personal activities, especially with standing. Pt relies on stable surfaces such as the wall or furniture to maintain balance when walking and standingPt expressed interest in walking program and dietary changes for weight loss. Make posture modifications, increase balance and functional strength. Communicate with medical provider for options on lifestyle modifications to lose weight. At next visit perform 6 min walk test.     Rehab Potential Fair   Clinical Impairments Affecting Rehab Potential Negative- medical comorbidities Positives- motivated    PT Frequency 2x / week   PT Duration 4 weeks   PT Treatment/Interventions Aquatic Therapy;Gait training;Stair training;Therapeutic activities;Therapeutic exercise;Balance training;Manual techniques;Energy conservation;Functional mobility training   PT Next Visit Plan Issue HEP   PT Home Exercise Plan Posture, endurance, strength, balance   Consulted and Agree with Plan of Care Patient      Patient will benefit from skilled therapeutic intervention in order to improve the following deficits and impairments:  Abnormal gait, Decreased balance, Decreased activity tolerance, Decreased endurance, Difficulty walking, Impaired sensation  Visit Diagnosis: Radiculopathy, lumbar region - Plan: PT plan of care cert/re-cert  Difficulty in walking, not elsewhere classified - Plan: PT plan of care cert/re-cert  Muscle weakness (generalized) - Plan: PT plan of care cert/re-cert     Problem List Patient Active Problem List   Diagnosis Date Noted  . Hip pain 05/24/2015    Fisher,Benjamin PT DPT 06/03/2016, 8:42 AM  [facility] PHYSICAL AND SPORTS MEDICINE [address] [place], [address] Phone: [phone]   Fax:  [phone]  Name: Luke Morgan MRN: [id number] Date of Birth: 22-Feb-1951

## 2016-06-07 ENCOUNTER — Ambulatory Visit: Payer: BC Managed Care – PPO | Admitting: Physical Therapy

## 2016-06-07 DIAGNOSIS — M5416 Radiculopathy, lumbar region: Secondary | ICD-10-CM | POA: Diagnosis not present

## 2016-06-07 DIAGNOSIS — M6281 Muscle weakness (generalized): Secondary | ICD-10-CM

## 2016-06-07 DIAGNOSIS — R262 Difficulty in walking, not elsewhere classified: Secondary | ICD-10-CM

## 2016-06-08 NOTE — Therapy (Signed)
[place] [facility] [address] [place], [address] Phone: [phone]   Fax:  [phone]  Physical Therapy Treatment  Patient Details  Name: Luke Morgan MRN: [id number] Date of Birth: October 30, 1950 Referring Provider: Lovell Sheehan  Encounter Date: 06/07/2016      PT End of Session - 06/07/16 1628    Visit Number 2   Number of Visits 9   Date for PT Re-Evaluation 07/01/16   PT Start Time 1430   PT Stop Time 1510   PT Time Calculation (min) 40 min   Activity Tolerance Patient limited by fatigue   Behavior During Therapy [facility] for tasks assessed/performed      Past Medical History:  Diagnosis Date  . Asthma   . Hypertension   . Kidney stones   . Obesity   . Osteoarthritis     No past surgical history on file.  There were no vitals filed for this visit.      Subjective Assessment - 06/07/16 1506    Subjective Pt states attending a "celebration of life" ceremony on Saturday that required long periods of standing and walking, which worsened his symptoms. Pt states he is still experiencing occasional decrease in sensation in LE. Pt reports trying posterior pelvic tilt exercise but did not notice a difference.    Pertinent History Back pain   Limitations Standing   How long can you sit comfortably? No limitations   How long can you stand comfortably? 10 minutes   Diagnostic tests See MRI report, multiple levels stenosis and disc bulge at L5   Patient Stated Goals Be able to stand and walk for longer durations of time, without LE numbness and tingling.   Currently in Pain? No/denies        Objective:  Pt performed 6 min walk test without cane, completed 400-500', pt sat down due to SOB and fatigue with 40 seconds remaining and stopped the test. Pt walked at a faster pace than last session, and did not use the wall or furniture for support.  Pt performed standing posterior pelvic tilt exercise, and  required extensive tactile and verbal cues. Pt corrected on avoiding lumbar extension and trying to keep back straight, with most of the movement coming from the pelvis. Pt said "I just feel like I'm squeezing my butt". Practiced pelvic tilts against the wall, and in hooklying position with the verbal cues to "image pressing your low back into the wall to close the space in the small of your back.".   Pt performed standing marching exercises with hands on the wall, 2x10, to engage the core muscles to lift the legs and help build LE endurance. Pt performed at a fast pace and fatigued easily.  Pt performed standing weight shift exercise using the taped square on the floor, standing in one block and stepping sideways, diagonal, and forward with opposite leg. This challenged pt's balance and pt was perspiring towards the end of the session.                           PT Education - 06/07/16 1627    Education provided Yes   Education Details HEP   Person(s) Educated Patient   Methods Explanation;Demonstration;Tactile cues;Verbal cues   Comprehension Verbalized understanding;Returned demonstration;Verbal cues required;Tactile cues required;Need further instruction             PT Long Term Goals - 06/03/16 7858  PT LONG TERM GOAL #1   Title Pt will be able to comfortably stand unsupported without N/T for 20 minutes to complete ADLs   Baseline Pt able to stand for 5 min before experiencing N/T or unsteadiness   Time 4   Period Weeks   Status New     PT LONG TERM GOAL #2   Title Pt will increase walking distance to 1500' without symptoms, or requiring a need for a break     Baseline Pt able to walk 1000' feet, with SOB at end    Time 4   Period Weeks   Status New     PT LONG TERM GOAL #3   Title Pt will be I with HEP to resume personal and recreational activities    Baseline Pt not yet Independent   Time 4   Period Weeks   Status New                Plan - 06/07/16 1629    Clinical Impression Statement Pt continues to have difficulty standing for long periods of time due to reduced endurance. Although pt was able to complete all exercises, he had SOB after performing exercises, required frequent rest breaks, and perspired easily. Monitored O2 saturation levels during session, was above 95% throughout. Also monitored HR, initially excessively elevated but with shorter periods of walking decr. Continue exercises to increase functional endurance.    Rehab Potential Fair   Clinical Impairments Affecting Rehab Potential Negative- medical comorbidities Positives- motivated    PT Frequency 2x / week   PT Duration 4 weeks   PT Treatment/Interventions Aquatic Therapy;Gait training;Stair training;Therapeutic activities;Therapeutic exercise;Balance training;Manual techniques;Energy conservation;Functional mobility training   PT Next Visit Plan Issue HEP   PT Home Exercise Plan Posture, endurance, strength, balance   Consulted and Agree with Plan of Care Patient      Patient will benefit from skilled therapeutic intervention in order to improve the following deficits and impairments:  Abnormal gait, Decreased balance, Decreased activity tolerance, Decreased endurance, Difficulty walking, Impaired sensation  Visit Diagnosis: Difficulty in walking, not elsewhere classified  Muscle weakness (generalized)     Problem List Patient Active Problem List   Diagnosis Date Noted  . Hip pain 05/24/2015    Fisher,Benjamin PT DPT 06/08/2016, 8:01 AM  [facility] PH[facility] SPORTS MEDICINE [address], [place], [address] Phone: [phone]   Fax:  [phone]  Name: Luke Morgan MRN: [id number] Date of Birth: 12/25/1950

## 2016-06-09 ENCOUNTER — Ambulatory Visit: Payer: BC Managed Care – PPO | Admitting: Physical Therapy

## 2016-06-14 ENCOUNTER — Encounter: Payer: Self-pay | Admitting: Physical Therapy

## 2016-06-14 ENCOUNTER — Ambulatory Visit: Payer: BC Managed Care – PPO | Admitting: Physical Therapy

## 2016-06-14 VITALS — BP 170/100 | HR 160

## 2016-06-14 DIAGNOSIS — R262 Difficulty in walking, not elsewhere classified: Secondary | ICD-10-CM

## 2016-06-14 DIAGNOSIS — M5416 Radiculopathy, lumbar region: Secondary | ICD-10-CM | POA: Diagnosis not present

## 2016-06-14 DIAGNOSIS — M6281 Muscle weakness (generalized): Secondary | ICD-10-CM

## 2016-06-15 NOTE — Therapy (Signed)
[place] [facility] [address], [place], [address] Phone: [phone]   Fax:  [phone]  Physical Therapy Treatment  Patient Details  Name: Luke Morgan MRN: [id number] Date of Birth: 30-May-1950 Referring Provider: Lovell Sheehan  Encounter Date: 06/14/2016      PT End of Session - 06/14/16 1649    Visit Number 3   Number of Visits 9   Date for PT Re-Evaluation 07/01/16   PT Start Time 1426   PT Stop Time 1510   PT Time Calculation (min) 44 min   Activity Tolerance Patient limited by fatigue   Behavior During Therapy [facility] for tasks assessed/performed      Past Medical History:  Diagnosis Date  . Asthma   . Hypertension   . Kidney stones   . Obesity   . Osteoarthritis     History reviewed. No pertinent surgical history.  Vitals:   06/14/16 1641  BP: (!) 170/100  Pulse: (!) 160        Subjective Assessment - 06/14/16 1643    Subjective Pt states that his knees have been causing him a signigicant amount of pain lately but denies significant amounts of numbness and tingling down his legs which he states could be attributed to him not standing much lately. Pt arrived 1 and a half hours early and was unable to be seen by his primary clinician, he appeared agitated from the onset of therapy.   Pertinent History Back pain   Limitations Standing   How long can you sit comfortably? No limitations   How long can you stand comfortably? 10 minutes   Diagnostic tests See MRI report, multiple levels stenosis and disc bulge at L5   Patient Stated Goals Be able to stand and walk for longer durations of time, without LE numbness and tingling.   Currently in Pain? No/denies      Objective: Heart rate was taken upon bringing pt. back to begin therapy. HR was noted at 160 so a rest was given prior to beginning. Before onset of exercise HR had lowered to 115BPM. Pt. ambulated 300' before a rest where HR had again  risen to 150.  After rest and lowering of HR another 200' of ambulation led to high HR that was able to be brought under control after another lengthy rest. Due to high fluctuation in HR PT initiated lower intensity sitting exercises.  Sitting trunk flexion using exercise ball to roll forward for 2 minutes to facilitate flexion of lumbar spine and allow movement and opening of spacing in lumbar vertebra.   Holding medicine ball with extended arms in front of chest rotation 2x30 seconds to facilitate movement of lumbar vertebra. Pt stated that this exercise felt good and that he felt a good stretch in his back. After these exercises HR was reassessed at 130.   BP was assessed at this time in sitting on the L arm with a large BP cuff. Was assessed twice with both readings being 170/100. Pt was instructed to contact PCP before next visit with these numbers to discuss possible ways to control vitals during therapy.  Pt. performed partial squats and weight shifts to strengthen LEs with consistent vital monitoring.  Pt. performed rhythmic trunk stabilization 3x30" with arms flexed to 90 degrees to activate paraspinal musculature. Pt noted difficulty and fatigue with this exercise and that he felt a "good workout" in his back.  PT progress continues to be limited due to unstable vitals.  PT placed call to referring physician to discuss course of treatment and possible alterations to treatment to control vitals PT spoke to triage nurse Alan Ripper to discuss vital numbers. She stated that she would present this information to the MD and he would return call. Pt was instructed to call PCP prior to next visit with information about vitals and assess current treatment plans.                           PT Education - 06/14/16 1648    Education provided Yes   Education Details Pt educated to call primary care provider to discuss high BP and HR during therapy   Person(s) Educated Patient    Methods Explanation   Comprehension Verbalized understanding             PT Long Term Goals - 06/03/16 0827      PT LONG TERM GOAL #1   Title Pt will be able to comfortably stand unsupported without N/T for 20 minutes to complete ADLs   Baseline Pt able to stand for 5 min before experiencing N/T or unsteadiness   Time 4   Period Weeks   Status New     PT LONG TERM GOAL #2   Title Pt will increase walking distance to 1500' without symptoms, or requiring a need for a break     Baseline Pt able to walk 1000' feet, with SOB at end    Time 4   Period Weeks   Status New     PT LONG TERM GOAL #3   Title Pt will be I with HEP to resume personal and recreational activities    Baseline Pt not yet Independent   Time 4   Period Weeks   Status New               Plan - 06/14/16 1652    Clinical Impression Statement Therapy limited due to above normal HR and BP levels throughout session. Pt displayed irritation and displeasure with therapy and having to take continued rest breaks due to high vitals. At next visit focus on seated exercises while monitoring vitals, if vitals are stable progess to standing. Pt was instructed to contact PCP about high vitals during therapy before next visit and verbalized understanding. PT contacted referring physician and spoke to [facility] the triage nurse to explain high vitals and assess course of treatment, MD to return call to PT.    Rehab Potential Fair   Clinical Impairments Affecting Rehab Potential Negative- medical comorbidities Positives- motivated    PT Frequency 2x / week   PT Duration 4 weeks   PT Treatment/Interventions Aquatic Therapy;Gait training;Stair training;Therapeutic activities;Therapeutic exercise;Balance training;Manual techniques;Energy conservation;Functional mobility training   PT Next Visit Plan Issue HEP   PT Home Exercise Plan Posture, endurance, strength, balance   Consulted and Agree with Plan of Care Patient       Patient will benefit from skilled therapeutic intervention in order to improve the following deficits and impairments:  Abnormal gait, Decreased balance, Decreased activity tolerance, Decreased endurance, Difficulty walking, Impaired sensation  Visit Diagnosis: Difficulty in walking, not elsewhere classified  Muscle weakness (generalized)  Radiculopathy, lumbar region     Problem List Patient Active Problem List   Diagnosis Date Noted  . Hip pain 05/24/2015    Briawna Carver PT DPT 06/15/2016, 7:30 AM  [place] [facility] [address] [place], [address] Phone:  [phone]   Fax:  [phone]  Name: Luke Morgan MRN: [id number] Date of Birth: Jun 15, 1950

## 2016-06-16 ENCOUNTER — Ambulatory Visit: Payer: BC Managed Care – PPO | Admitting: Physical Therapy

## 2016-06-16 DIAGNOSIS — M5416 Radiculopathy, lumbar region: Secondary | ICD-10-CM | POA: Diagnosis not present

## 2016-06-16 DIAGNOSIS — M6281 Muscle weakness (generalized): Secondary | ICD-10-CM

## 2016-06-16 DIAGNOSIS — R262 Difficulty in walking, not elsewhere classified: Secondary | ICD-10-CM

## 2016-06-17 NOTE — Therapy (Signed)
[place] [facility]TS [facility] [address], [place], [address] Phone: [phone]   Fax:  [phone]  Physical Therapy Treatment  Patient Details  Name: LANG LOPEZRODRIGUEZ MRN: [id number] Date of Birth: 06-18-1950 Referring Provider: Lovell Sheehan  Encounter Date: 06/16/2016      PT End of Session - 06/16/16 1201    Visit Number 4   Number of Visits 9   Date for PT Re-Evaluation 07/01/16   PT Start Time 0833   PT Stop Time 0912   PT Time Calculation (min) 39 min   Activity Tolerance Patient tolerated treatment well   Behavior During Therapy [facility] for tasks assessed/performed      Past Medical History:  Diagnosis Date  . Asthma   . Hypertension   . Kidney stones   . Obesity   . Osteoarthritis     No past surgical history on file.  There were no vitals filed for this visit.      Subjective Assessment - 06/16/16 0915    Subjective Pt states that he is not experiencing any back pain, but has occasional reduced sensation in his feet. Pt reports having an appointment with his PCP this afternoon and will discuss his BP medications.    Pertinent History Back pain   Limitations Standing   How long can you sit comfortably? No limitations   How long can you stand comfortably? 10 minutes   Diagnostic tests See MRI report, multiple levels stenosis and disc bulge at L5   Patient Stated Goals Be able to stand and walk for longer durations of time, without LE numbness and tingling.   Currently in Pain? No/denies       Objective:    Pt performed 10 min on NuStep at level 1, with rest breaks every 3 min. Pt able to pedal at a good rhythm and at an appropriate intensity without overexertion due to monitoring pulse which remained below 130 and no SOB.  Pt performed standing knee lift "marches" while holding onto rail 2x10 each leg to work on LE strength and endurance. Pt required cues on appropriate tempo, as he would march too  quickly which minimized trunk control.  Pt performed standing balance weight shifts while holding onto rail. Pt stepped forward into a lunge in a square pattern (right front, right back, left front, left back) to focus on weight shifts, balance, and core stability. Pt reported exercises "felt fine" but experienced L knee discomfort when stepping forward with the L leg, instructed to not step in that direction. 2x8 each leg                          PT Education - 06/16/16 1200    Education provided Yes   Education Details HEP   Person(s) Educated Patient   Methods Explanation;Demonstration;Verbal cues   Comprehension Verbalized understanding;Returned demonstration             PT Long Term Goals - 06/03/16 0827      PT LONG TERM GOAL #1   Title Pt will be able to comfortably stand unsupported without N/T for 20 minutes to complete ADLs   Baseline Pt able to stand for 5 min before experiencing N/T or unsteadiness   Time 4   Period Weeks   Status New     PT LONG TERM GOAL #2   Title Pt will increase walking distance to 1500' without symptoms, or requiring a need for  a break     Baseline Pt able to walk 1000' feet, with SOB at end    Time 4   Period Weeks   Status New     PT LONG TERM GOAL #3   Title Pt will be I with HEP to resume personal and recreational activities    Baseline Pt not yet Independent   Time 4   Period Weeks   Status New               Plan - 06/16/16 1203    Clinical Impression Statement (P)  Pt performed NuStep and standing exercises with frequent rest breaks and tolerated session very well. Pt vitals were monitored throughout session. Pt reports having an appointment with his PCP later today to discuss BP medications and shots for his knees.  At next visit continue with NuStep machine and progress to more difficult standing balance exercises.    Rehab Potential (P)  Fair   Clinical Impairments Affecting Rehab Potential (P)   Negative- medical comorbidities Positives- motivated    PT Frequency (P)  2x / week   PT Duration (P)  4 weeks   PT Treatment/Interventions (P)  Aquatic Therapy;Gait training;Stair training;Therapeutic activities;Therapeutic exercise;Balance training;Manual techniques;Energy conservation;Functional mobility training   PT Next Visit Plan (P)  Issue HEP   PT Home Exercise Plan (P)  Posture, endurance, strength, balance   Consulted and Agree with Plan of Care (P)  Patient      Patient will benefit from skilled therapeutic intervention in order to improve the following deficits and impairments:  (P) Abnormal gait, Decreased balance, Decreased activity tolerance, Decreased endurance, Difficulty walking, Impaired sensation  Visit Diagnosis: Muscle weakness (generalized)  Radiculopathy, lumbar region  Difficulty in walking, not elsewhere classified     Problem List Patient Active Problem List   Diagnosis Date Noted  . Hip pain 05/24/2015    Nichele Slawson PT DPT 06/17/2016, 10:37 AM  [place] [facility] PHYS[facility] SPORTS MEDICINE [address], [place], [address] Phone: [phone]   Fax:  [phone]  Name: DENNYS GUIN MRN: [id number] Date of Birth: [birth year]

## 2016-06-21 ENCOUNTER — Encounter: Payer: Self-pay | Admitting: Physical Therapy

## 2016-06-21 ENCOUNTER — Ambulatory Visit: Payer: BC Managed Care – PPO | Admitting: Physical Therapy

## 2016-06-21 DIAGNOSIS — R262 Difficulty in walking, not elsewhere classified: Secondary | ICD-10-CM

## 2016-06-21 DIAGNOSIS — M5416 Radiculopathy, lumbar region: Secondary | ICD-10-CM | POA: Diagnosis not present

## 2016-06-21 DIAGNOSIS — M6281 Muscle weakness (generalized): Secondary | ICD-10-CM

## 2016-06-22 NOTE — Therapy (Signed)
[place] [facility] PH[facility] SPORTS MEDICINE [address] [place], [address] Phone: [phone]   Fax:  [phone]  Physical Therapy Treatment  Patient Details  Name: Luke Morgan MRN: [id number] Date of Birth: 01/15/1951 Referring Provider: Lovell Sheehan  Encounter Date: 06/21/2016      PT End of Session - 06/21/16 1724    Visit Number 5   Number of Visits 9   Date for PT Re-Evaluation 07/01/16   PT Start Time 1427   PT Stop Time 1505   PT Time Calculation (min) 38 min   Activity Tolerance Patient tolerated treatment well   Behavior During Therapy [facility] for tasks assessed/performed      Past Medical History:  Diagnosis Date  . Asthma   . Hypertension   . Kidney stones   . Obesity   . Osteoarthritis     History reviewed. No pertinent surgical history.  There were no vitals filed for this visit.      Subjective Assessment - 06/21/16 1709    Subjective Pt states that he had an EKG and was diagnosed with afib, has appointment with cardiolo0gist tomorrow. Pt reports MD is aware that pt is attending PT and consents to continued tx. Pt states that on Saturday at a social gathering he was standing for a long time and felt his legs "were going to give out". Pt reports interest in using the exercise bike he has at home.    Pertinent History Back pain   Limitations Standing   How long can you sit comfortably? No limitations   How long can you stand comfortably? 10 minutes   Diagnostic tests See MRI report, multiple levels stenosis and disc bulge at L5   Patient Stated Goals Be able to stand and walk for longer durations of time, without LE numbness and tingling.   Currently in Pain? No/denies   Multiple Pain Sites No      Objective:  Sit-to-stand squats with plinth raised to max height to work on LE strength to get out of chair without relying on UE support, 10x, pt reported this was uncomfortable on his knees, discontinued.    NuStep level 1, 10 min with rest every 3 min, to work on LE and cardiovascular endurance. Monitored O2 sat (above 95%) and HR (116-135 bpm) during 30 sec rest breaks.   Sidestepping across 10' line with partner ball toss using soccer ball, to work on standing endurance, core activation, and coordinating UE and LE movements. 8x across line with rest after 4x, as pt was breathing heavily during. Pt able to perform with no cues.   Seated exercise ball roll outs with extra large blue ball. Cued pt to engage core muscles to help bring torso back up rather than using back muscles. 3x15 To promote core activation and promote forward flexion of spine which is a comfortable position for the pt.                             PT Education - 06/21/16 1722    Education provided Yes   Education Details HEP   Person(s) Educated Patient   Methods Explanation;Demonstration;Tactile cues;Verbal cues   Comprehension Verbalized understanding;Returned demonstration             PT Long Term Goals - 06/03/16 0827      PT LONG TERM GOAL #1   Title Pt will be able to comfortably stand unsupported without N/T  for 20 minutes to complete ADLs   Baseline Pt able to stand for 5 min before experiencing N/T or unsteadiness   Time 4   Period Weeks   Status New     PT LONG TERM GOAL #2   Title Pt will increase walking distance to 1500' without symptoms, or requiring a need for a break     Baseline Pt able to walk 1000' feet, with SOB at end    Time 4   Period Weeks   Status New     PT LONG TERM GOAL #3   Title Pt will be I with HEP to resume personal and recreational activities    Baseline Pt not yet Independent   Time 4   Period Weeks   Status New               Plan - 06/21/16 1727    Clinical Impression Statement Pt reports being diagnosed with afib after EKG appointment last week, will see cardiologist tomorrow. Pt appears to tolerate increased activity level during PT  sessions, although requires frequent rests breaks due to heavy breathing. Pt states interest in using a seated exercise machine (similar to NuStep) that he has at home. Asked pt to take a picture to bring to next appointment to consider adding exercise machine into HEP. Pt encouraged to walk around the house every hour to prevent sitting for hours at a time (which appears to aggravate his low back).    Rehab Potential Fair   Clinical Impairments Affecting Rehab Potential Negative- medical comorbidities Positives- motivated    PT Frequency 2x / week   PT Duration 4 weeks   PT Treatment/Interventions Aquatic Therapy;Gait training;Stair training;Therapeutic activities;Therapeutic exercise;Balance training;Manual techniques;Energy conservation;Functional mobility training   PT Next Visit Plan Issue HEP   PT Home Exercise Plan Posture, endurance, strength, balance   Consulted and Agree with Plan of Care Patient      Patient will benefit from skilled therapeutic intervention in order to improve the following deficits and impairments:  Abnormal gait, Decreased balance, Decreased activity tolerance, Decreased endurance, Difficulty walking, Impaired sensation  Visit Diagnosis: Muscle weakness (generalized)  Radiculopathy, lumbar region  Difficulty in walking, not elsewhere classified     Problem List Patient Active Problem List   Diagnosis Date Noted  . Hip pain 05/24/2015    Fisher,Benjamin PT DPT 06/22/2016, 7:12 AM  [place] [facility] PHYSICAL AND SPORTS MEDICINE [address] [place], [address] Phone: [phone]   Fax:  [phone]  Name: Luke Morgan MRN: [id number] Date of Birth: 1951/01/08

## 2016-06-23 ENCOUNTER — Encounter: Payer: Self-pay | Admitting: Physical Therapy

## 2016-06-23 ENCOUNTER — Ambulatory Visit: Payer: BC Managed Care – PPO | Admitting: Physical Therapy

## 2016-06-23 DIAGNOSIS — R262 Difficulty in walking, not elsewhere classified: Secondary | ICD-10-CM

## 2016-06-23 DIAGNOSIS — M5416 Radiculopathy, lumbar region: Secondary | ICD-10-CM | POA: Diagnosis not present

## 2016-06-23 DIAGNOSIS — M6281 Muscle weakness (generalized): Secondary | ICD-10-CM

## 2016-06-23 NOTE — Therapy (Addendum)
[facility] [address] [place], [address] Phone: [phone]   Fax:  [phone]  Physical Therapy Treatment  Patient Details  Name: ALWIN LANIGAN MRN: [id number] Date of Birth: Dec 19, 1950 Referring Provider: Lovell Sheehan  Encounter Date: 06/23/2016      PT End of Session - 06/23/16 1156    Visit Number 6   Number of Visits 9   Date for PT Re-Evaluation 07/01/16   PT Start Time 1045   PT Stop Time 1123   PT Time Calculation (min) 38 min   Activity Tolerance Patient tolerated treatment well   Behavior During Therapy [facility] for tasks assessed/performed      Past Medical History:  Diagnosis Date  . Asthma   . Hypertension   . Kidney stones   . Obesity   . Osteoarthritis     History reviewed. No pertinent surgical history.  There were no vitals filed for this visit.      Subjective Assessment - 06/23/16 1155    Subjective Pt states   Pertinent History Back pain   Limitations Standing   How long can you sit comfortably? No limitations   How long can you stand comfortably? 10 minutes   Diagnostic tests See MRI report, multiple levels stenosis and disc bulge at L5   Patient Stated Goals Be able to stand and walk for longer durations of time, without LE numbness and tingling.   Currently in Pain? No/denies                  Objective:  NuStep level 1 for 10 min to increase LE endurance. Rest break at 4 and 8 min mark. Monitored vitals, O2 sat (above 95%) and HR (100-109) throughout session, pt did not have SOB.   Supine hooklying alternating marches to promote lumbar flexion and increase LE and core strength. 2x10 each leg with rest between sets. Pt reported "I could do these all day", instructed patient to pause and hold leg at the top before lowering leg to increase challenge. Pt was perspiring, offered water.   Checked BP, 170/110, discontinued session. Educated pt regarding the  need to discontinue despite his cardiologist being aware of elevated BP due to incr. Risk for adverse event.              PT Education - 06/23/16 1155    Education provided Yes   Education Details HEP, communication with PCP and cardiologist about POC   Person(s) Educated Patient   Methods Explanation;Demonstration;Tactile cues;Verbal cues   Comprehension Verbalized understanding;Returned demonstration             PT Long Term Goals - 06/03/16 0827      PT LONG TERM GOAL #1   Title Pt will be able to comfortably stand unsupported without N/T for 20 minutes to complete ADLs   Baseline Pt able to stand for 5 min before experiencing N/T or unsteadiness   Time 4   Period Weeks   Status New     PT LONG TERM GOAL #2   Title Pt will increase walking distance to 1500' without symptoms, or requiring a need for a break     Baseline Pt able to walk 1000' feet, with SOB at end    Time 4   Period Weeks   Status New     PT LONG TERM GOAL #3   Title Pt will be I with HEP to resume personal and recreational activities  Baseline Pt not yet Independent   Time 4   Period Weeks   Status New               Plan - 06/23/16 1157    Clinical Impression Statement Pt reports   Rehab Potential Fair   Clinical Impairments Affecting Rehab Potential Negative- medical comorbidities Positives- motivated    PT Frequency 2x / week   PT Duration 4 weeks   PT Treatment/Interventions Aquatic Therapy;Gait training;Stair training;Therapeutic activities;Therapeutic exercise;Balance training;Manual techniques;Energy conservation;Functional mobility training   PT Next Visit Plan Issue HEP   PT Home Exercise Plan Posture, endurance, strength, balance   Consulted and Agree with Plan of Care Patient      Patient will benefit from skilled therapeutic intervention in order to improve the following deficits and impairments:  Abnormal gait, Decreased balance, Decreased activity tolerance,  Decreased endurance, Difficulty walking, Impaired sensation  Visit Diagnosis: Muscle weakness (generalized)  Radiculopathy, lumbar region  Difficulty in walking, not elsewhere classified     Problem List Patient Active Problem List   Diagnosis Date Noted  . Hip pain 05/24/2015    Renford Dills SPT Tishia Maestre PT DPT 06/23/2016, 11:59 AM  [facility] PH[facility] SPORTS MEDICINE [address], [place], [address] Phone: [phone]   Fax:  [phone]  Name: KAMILO BRANDL MRN: [id number] Date of Birth: August 16, 1950

## 2016-06-28 ENCOUNTER — Encounter: Payer: BC Managed Care – PPO | Admitting: Physical Therapy

## 2016-06-30 ENCOUNTER — Ambulatory Visit: Payer: BC Managed Care – PPO | Attending: Neurosurgery | Admitting: Physical Therapy

## 2016-06-30 DIAGNOSIS — R262 Difficulty in walking, not elsewhere classified: Secondary | ICD-10-CM | POA: Insufficient documentation

## 2016-06-30 DIAGNOSIS — M5416 Radiculopathy, lumbar region: Secondary | ICD-10-CM | POA: Insufficient documentation

## 2016-06-30 DIAGNOSIS — M6281 Muscle weakness (generalized): Secondary | ICD-10-CM | POA: Diagnosis present

## 2016-07-01 NOTE — Therapy (Signed)
[facility] [address], [place], [address] Phone: [phone]   Fax:  [phone]  Physical Therapy Treatment  Patient Details  Name: Luke Morgan MRN: [id number] Date of Birth: March 15, 1951 Referring Provider: Arnoldo Morale  Encounter Date: 06/30/2016      PT End of Session - 06/30/16 1133    Visit Number 7   Number of Visits 9   Date for PT Re-Evaluation 07/01/16   PT Start Time 7989   PT Stop Time 1130   PT Time Calculation (min) 45 min   Activity Tolerance Patient tolerated treatment well   Behavior During Therapy [facility] for tasks assessed/performed      Past Medical History:  Diagnosis Date  . Asthma   . Hypertension   . Kidney stones   . Obesity   . Osteoarthritis     No past surgical history on file.  There were no vitals filed for this visit.      Subjective Assessment - 06/30/16 1133    Subjective Pt states he saw his cardiologist and was told he has an enlarged heart, but no significant issues. Pt reports he has been using his recumbent bike at home 4x/week for 15 min each. Pt reports he has not noticed any instances of his legs "giving out", but has also not had to stand for prolonged periods.    Pertinent History Back pain   Limitations Standing   How long can you sit comfortably? No limitations   How long can you stand comfortably? 10 minutes   Diagnostic tests See MRI report, multiple levels stenosis and disc bulge at L5   Patient Stated Goals Be able to stand and walk for longer durations of time, without LE numbness and tingling.   Currently in Pain? No/denies   Multiple Pain Sites No      Objective:  NuStep, resistance level 1 for 1 min, increased to level 2 for 5 min to increase challenge as pt had no difficulty with level 1.1 min rest break after 3 min. Performed to increase LE endurance and aerobic fitness. Pt appeared to have minimal SOB, able to hold a conversation  throughout. Pt reports feeling "pressure" in his knee during R knee extension on the pedal, educated patient that with recumbent bike the pedals will rotate to minimize knee pressure. O2 monitored and effort cued based on maintaining appropriate oxygenation.  6 min walk test to reassess walking endurance, pt walked 500-600' with no cane, no SOB, no seated breaks, and did not use walls or rails for support. Pt did not report any changes in sensation of his LEs. Noted diaphoresis with this exertion.  Seated ball rollouts with XL blue ball, 2x15, to increase lumbar flexion with roll out, and facilitate core activation with return to neutral.   Monitored vitals throughout session, O2 WNL, HR within 85-110 bpm.                           PT Education - 06/30/16 1136    Education provided Yes   Education Details HEP (workout program for gym), pt to check in with surgeon about continuing PT   Person(s) Educated Patient   Methods Explanation;Demonstration   Comprehension Verbalized understanding;Returned demonstration             PT Long Term Goals - 06/30/16 0734      PT LONG TERM GOAL #1   Title Pt will be  able to comfortably stand unsupported without N/T for 20 minutes to complete ADLs   Baseline Pt able to stand for 5 min before experiencing N/T or unsteadiness   Time 4   Period Weeks   Status Not Met     PT LONG TERM GOAL #2   Title Pt will increase walking distance to 1500' without symptoms, or requiring a need for a break     Baseline Pt able to walk 1000' feet, with SOB at end    Time 4   Period Weeks   Status Not Met     PT LONG TERM GOAL #3   Title Pt will be I with HEP to resume personal and recreational activities    Baseline Pt not yet Independent   Time 4   Period Weeks   Status Achieved               Plan - 06/30/16 1137    Clinical Impression Statement At this time, PT encourages pt to return to referring physician for followup. He  has responded well to PT in terms of incr. functional capacity - he is now able to walk 6 min with no break and no SPC. He has been limited by cardiac issues in exercise tolerance however has participated when appropriate. Referring back to physician as issues related to his leg/back have not changed with therapy. Pt is willing to continue with PT for strengthening/improving functional tolerance however would like pt to see MD first as primary complaint is not yet addressed. 1 of 3 goals met reflecting improved functional tolerance.   Rehab Potential Fair   Clinical Impairments Affecting Rehab Potential Negative- medical comorbidities Positives- motivated    PT Frequency 2x / week   PT Duration 4 weeks   PT Treatment/Interventions Aquatic Therapy;Gait training;Stair training;Therapeutic activities;Therapeutic exercise;Balance training;Manual techniques;Energy conservation;Functional mobility training   PT Next Visit Plan Issue HEP   PT Home Exercise Plan Posture, endurance, strength, balance   Consulted and Agree with Plan of Care Patient      Patient will benefit from skilled therapeutic intervention in order to improve the following deficits and impairments:  Abnormal gait, Decreased balance, Decreased activity tolerance, Decreased endurance, Difficulty walking, Impaired sensation  Visit Diagnosis: Muscle weakness (generalized)  Radiculopathy, lumbar region  Difficulty in walking, not elsewhere classified     Problem List Patient Active Problem List   Diagnosis Date Noted  . Hip pain 05/24/2015   Manfred Arch, SPT Anjolina Byrer PT DPT 07/01/2016, 7:44 AM  [facility] [address] [place], [address] Phone: [phone]   Fax:  [phone]  Name: Luke Morgan MRN: [id number] Date of Birth: January 25, 1951

## 2016-11-22 ENCOUNTER — Encounter: Payer: Self-pay | Admitting: Internal Medicine

## 2016-12-16 ENCOUNTER — Ambulatory Visit (INDEPENDENT_AMBULATORY_CARE_PROVIDER_SITE_OTHER): Payer: BC Managed Care – PPO | Admitting: Internal Medicine

## 2016-12-16 ENCOUNTER — Other Ambulatory Visit: Payer: Self-pay

## 2016-12-16 ENCOUNTER — Encounter: Payer: Self-pay | Admitting: Internal Medicine

## 2016-12-16 VITALS — BP 148/94 | HR 87 | Ht 77.0 in | Wt 397.5 lb

## 2016-12-16 DIAGNOSIS — I4891 Unspecified atrial fibrillation: Secondary | ICD-10-CM | POA: Diagnosis not present

## 2016-12-16 DIAGNOSIS — Z01812 Encounter for preprocedural laboratory examination: Secondary | ICD-10-CM | POA: Diagnosis not present

## 2016-12-16 MED ORDER — FUROSEMIDE 20 MG PO TABS: 20.0000 mg | ORAL_TABLET | Freq: Every day | ORAL | Status: DC

## 2016-12-16 NOTE — Patient Instructions (Signed)
Medication Instructions: - Your physician has recommended you make the following change in your medication:  1) Start lasix (furosemide) 20 mg- take 1 tablet by mouth once daily  Labwork: - Your physician recommends that you have lab work today: BMP/ CBC  Procedures/Testing: - Your physician has recommended that you have a Cardioversion (DCCV). Electrical Cardioversion uses a jolt of electricity to your heart either through paddles or wired patches attached to your chest. This is a controlled, usually prescheduled, procedure. Defibrillation is done under light anesthesia in the hospital, and you usually go home the day of the procedure. This is done to get your heart back into a normal rhythm. You are not awake for the procedure.   You are scheduled for a Cardioversion on __Thursday 8/2/18__ with Dr. Mariah Milling  [facility] at [facility] at __6:30 am__ on the day of your procedure.  DIET INSTRUCTIONS:  Nothing to eat or drink after midnight the night prior to your procedure  1) Labs: _____7/26/18_____________  2) Medications: You may take all of your regular medications the morning of your procedure with enough water to get them down safely unless listed below:  - hold metformin the morning of your procedure - hold furosemide the morning of your procedure   3) Must have a responsible person to drive you home.  4) Bring a current list of your medications and current insurance cards.  If you have any questions after you get home, please call the office at [phone]   Follow-Up: - Your physician recommends that you schedule a follow-up appointment in: 5 weeks with Dr. Graciela Husbands.  Any Additional Special Instructions Will Be Listed Below (If Applicable).     If you need a refill on your cardiac medications before your next appointment, please call your pharmacy.

## 2016-12-16 NOTE — Progress Notes (Signed)
ELECTROPHYSIOLOGY CONSULT NOTE  Patient ID: Luke Morgan, MRN: [id number], DOB/AGE: 08/18/1950 66 y.o. Admit date: (Not on file) Date of Consult: 12/16/2016  Primary Physician: Barbette Reichmann, MD Primary Cardiologist: Kahlen Morais is a 66 y.o. male who is being seen today for the evaluation of Afib at the request of Dr Juel Burrow.    HPI Luke Morgan is a 66 y.o. male referred for treatment options related to atrial fibrillation.  This was detected routine examination early winter 2016.  He had no associated palpitations. Even retrospectively it is not clear that there was a change in exercise tolerance; however, it should be noted that he has significant back issues and his exercise tolerance is quite limited anyway. There was some dyspnea. He is also has peripheral edema which may or may not have persistent.  He is morbidly obese. He is treated sleep apnea.  Echocardiogram was apparently done but is not available.  Thromboembolic risk factors ( age -58 , HTN-1,) for a CHADSVASc Score of  2 sort of with his birthday in 6 months   Past Medical History:  Diagnosis Date  . Asthma   . Hypertension   . Kidney stones   . Obesity   . Osteoarthritis       Surgical History: History reviewed. No pertinent surgical history.   Home Meds: Prior to Admission medications   Medication Sig Start Date End Date Taking? Authorizing Provider  acetaminophen (TYLENOL) 325 MG tablet Take 650 mg by mouth every 6 (six) hours as needed.   Yes [provider]  allopurinol (ZYLOPRIM) 300 MG tablet Take 300 mg by mouth daily.   Yes [provider]  Apixaban (ELIQUIS PO) Take by mouth 2 (two) times daily.   Yes [provider]  Fluticasone-Salmeterol (ADVAIR) 100-50 MCG/DOSE AEPB Inhale 1 puff into the lungs 2 (two) times daily.   Yes [provider]  gabapentin (NEURONTIN) 300 MG capsule Take 300 mg by mouth 3 (three) times daily.   Yes  [provider]  hydrALAZINE (APRESOLINE) 50 MG tablet Take 50 mg by mouth 3 (three) times daily.   Yes [provider]  losartan-hydrochlorothiazide (HYZAAR) 100-25 MG tablet Take 1 tablet by mouth daily.   Yes [provider]  metFORMIN (GLUMETZA) 500 MG (MOD) 24 hr tablet Take 500 mg by mouth daily with breakfast.   Yes [provider]  metoprolol succinate (TOPROL-XL) 100 MG 24 hr tablet Take 100 mg by mouth daily. Take with or immediately following a meal.   Yes [provider]    Allergies:  Allergies  Allergen Reactions  . Penicillins     Has patient had a PCN reaction causing immediate rash, facial/tongue/throat swelling, SOB or lightheadedness with hypotension: Yes Has patient had a PCN reaction causing severe rash involving mucus membranes or skin necrosis: No Has patient had a PCN reaction that required hospitalization No Has patient had a PCN reaction occurring within the last 10 years: No If all of the above answers are "NO", then may proceed with Cephalosporin use.    Social History   Social History  . Marital status: Married    Spouse name: N/A  . Number of children: N/A  . Years of education: N/A   Occupational History  . Not on file.   Social History Main Topics  . Smoking status: Never Smoker  . Smokeless tobacco: Never Used  . Alcohol use Yes  Comment: meals  . Drug use: No  . Sexual activity: Not on file   Other Topics Concern  . Not on file   Social History Narrative  . No narrative on file     History reviewed. No pertinent family history.   ROS:  Please see the history of present illness.     All other systems reviewed and negative.    Physical Exam: Height 6\' 5"  (1.956 m), weight (!) 397 lb 8 oz (180.3 kg). General: Well developed, Morbidly obese male in no acute distress. Head: Normocephalic, atraumatic, sclera non-icteric, no xanthomas, nares are without discharge. EENT: normal  Lymph  Nodes:  none Neck: Negative for carotid bruits. JVD 8-10 cm  Back:without scoliosis kyphosis  Lungs: Clear bilaterally to auscultation without wheezes, rales, or rhonchi. Breathing is unlabored. Heart: Irregularly irregular with a 2 6 systolic  murmur . No rubs, or gallops appreciated. Abdomen: Soft, non-tender, non-distended with normoactive bowel sounds. No hepatomegaly. No rebound/guarding. No obvious abdominal masses. Msk:  Strength and tone appear normal for age. Extremities: No clubbing or cyanosis.  2+ edema.  Distal pedal pulses are 2+ and equal bilaterally. Skin: Warm and Dry Neuro: Alert and oriented X 3. CN III-XII intact Grossly normal sensory and motor function . Psych:  Responds to questions appropriately with a normal affect.      Labs: Cardiac Enzymes No results for input(s): CKTOTAL, CKMB, TROPONINI in the last 72 hours. CBC Lab Results  Component Value Date   WBC 9.2 04/16/2016   HGB 16.2 04/16/2016   HCT 48.5 04/16/2016   MCV 86.7 04/16/2016   PLT 237 04/16/2016   PROTIME: No results for input(s): LABPROT, INR in the last 72 hours. Chemistry No results for input(s): NA, K, CL, CO2, BUN, CREATININE, CALCIUM, PROT, BILITOT, ALKPHOS, ALT, AST, GLUCOSE in the last 168 hours.  Invalid input(s): LABALBU Lipids No results found for: CHOL, HDL, LDLCALC, TRIG BNP No results found for: PROBNP Thyroid Function Tests: No results for input(s): TSH, T4TOTAL, T3FREE, THYROIDAB in the last 72 hours.  Invalid input(s): FREET3 Miscellaneous No results found for: DDIMER  Radiology/Studies:  No results found.  EKG: Atrial fibrillation at 87 Intervals-/08/37 Low-voltage   Assessment and Plan:  Atrial fibrillation-persistent  Congestive heart failure presumably HFpEF  Morbid obesity    hypertension     The patient has persistent atrial fibrillation with what appears to be a controlled ventricular response. There seems to be some impairment of exercise  tolerance. This may be contributing to the  worsening of his peripheral edema. We have discussed issues of atrial fibrillation in terms of its physiology is impairment of exercise tolerance and its associated risk of thromboembolism. As relates to the latter his CHADS-VASc score is 1 but he is approaching his 66th birthday. In anticipation of cardioversion is reasonable to continue him on his anticoagulation. Exercise performance made been attenuated, if indeed worse, by the atrial fibrillation but also by fluid accumulation which may or may not be related to the atrial fibrillation.  Hence, we have discussed the strategy of rhythm control initially with anticipation of DC cardioversion. In addition, we will begin diuretics for his volume overload which is quite significant. We still need to get a copy of his echocardiogram which has been requested but not yet received. I suspect, that he will have had normal LV function. Left atrial size will be important.  Old notes it also raises the possibility of catheter ablation. Currently his weight is preclusive. He has been  successful however losing 30 pounds over the last number of months; he was about 100 pounds lighter about a decade ago and he will continue to work on weight loss.   I suspect is low-voltage ECG is related to his body habitus. Await echocardiogram. His age thinking about amyloid is important   in the event that he reverts to atrial fibrillation, his response to cardioversion would inform the right next step i.e. do a use a rhythm control strategy or a rate control strategy. We have reviewed that together.   We will begin him on a diuretic daily-furosemide 20 mg. He will need metabolic profile about 2 weeks time        Sherryl Manges

## 2016-12-17 ENCOUNTER — Telehealth: Payer: Self-pay | Admitting: Internal Medicine

## 2016-12-17 LAB — BASIC METABOLIC PANEL
BUN / CREAT RATIO: 19 (ref 10–24)
BUN: 17 mg/dL (ref 8–27)
CALCIUM: 9.1 mg/dL (ref 8.6–10.2)
CHLORIDE: 100 mmol/L (ref 96–106)
CO2: 21 mmol/L (ref 20–29)
Creatinine, Ser: 0.88 mg/dL (ref 0.76–1.27)
GFR calc Af Amer: 105 mL/min/{1.73_m2} (ref >59)
GFR calc non Af Amer: 91 mL/min/{1.73_m2} (ref >59)
GLUCOSE: 87 mg/dL (ref 65–99)
Potassium: 4 mmol/L (ref 3.5–5.2)
Sodium: 140 mmol/L (ref 134–144)

## 2016-12-17 LAB — CBC WITH DIFFERENTIAL/PLATELET
BASOS ABS: 0 10*3/uL (ref 0.0–0.2)
Basos: 0 %
EOS (ABSOLUTE): 0.1 10*3/uL (ref 0.0–0.4)
Eos: 1 %
HEMOGLOBIN: 15.5 g/dL (ref 13.0–17.7)
Hematocrit: 47.2 % (ref 37.5–51.0)
IMMATURE GRANULOCYTES: 0 %
Immature Grans (Abs): 0 10*3/uL (ref 0.0–0.1)
LYMPHS ABS: 1.4 10*3/uL (ref 0.7–3.1)
LYMPHS: 17 %
MCH: 28.9 pg (ref 26.6–33.0)
MCHC: 32.8 g/dL (ref 31.5–35.7)
MCV: 88 fL (ref 79–97)
MONOCYTES: 7 %
Monocytes Absolute: 0.6 10*3/uL (ref 0.1–0.9)
Neutrophils Absolute: 6.3 10*3/uL (ref 1.4–7.0)
Neutrophils: 75 %
PLATELETS: 236 10*3/uL (ref 150–379)
RBC: 5.37 x10E6/uL (ref 4.14–5.80)
RDW: 14.5 % (ref 12.3–15.4)
WBC: 8.4 10*3/uL (ref 3.4–10.8)

## 2016-12-17 NOTE — Telephone Encounter (Signed)
Pharmacist called, states pt thought Dr. Graciela Husbands was going to call in a diuretic, and has not seen anything come through. Please advise.

## 2016-12-17 NOTE — Telephone Encounter (Signed)
Spoke with pharmacy and confirmed that Dr. Graciela Husbands sent in prescription yesterday for Furosemide 20 mg once daily 30 tablets with no refill. He read back information and stated that they were having some issues with electronic transmissions. He was appreciative for the call back with no further questions at this time.

## 2016-12-20 ENCOUNTER — Encounter: Payer: Self-pay | Admitting: Internal Medicine

## 2016-12-22 ENCOUNTER — Telehealth: Payer: Self-pay | Admitting: Cardiovascular Disease

## 2016-12-22 NOTE — Telephone Encounter (Signed)
Spoke with patient regarding cardioversion scheduled tomorrow and need to reschedule due to scheduling problems. He was very understanding and agreeable with moving cardioversion procedure to Friday December 24, 2016 at 09:30 AM with arrival time of 08:30AM. He verbalized understanding with no further questions at this time.

## 2016-12-22 NOTE — Telephone Encounter (Signed)
Can we ask Dr. Koren Bound nurse, Herbert Seta, If his echo ever came in? Kleins note indicates it was requested from outside?

## 2016-12-23 ENCOUNTER — Other Ambulatory Visit: Payer: Self-pay | Admitting: Cardiovascular Disease

## 2016-12-23 NOTE — Telephone Encounter (Signed)
No echo received. I left a message with Dr. Fredna Dow nurse, Morrie Sheldon, to please send a copy of the patient's most recent echo.

## 2016-12-24 ENCOUNTER — Encounter: Payer: Self-pay | Admitting: Internal Medicine

## 2016-12-24 ENCOUNTER — Ambulatory Visit
Admission: RE | Admit: 2016-12-24 | Discharge: 2016-12-24 | Disposition: A | Discharge status: Home / Self Care | Payer: BC Managed Care – PPO | Source: Ambulatory Visit | Attending: Cardiovascular Disease | Admitting: Cardiovascular Disease

## 2016-12-24 ENCOUNTER — Encounter: Payer: Self-pay | Admitting: *Deleted

## 2016-12-24 ENCOUNTER — Encounter
Admission: RE | Disposition: A | Discharge status: Home / Self Care | Payer: Self-pay | Source: Ambulatory Visit | Attending: Cardiovascular Disease

## 2016-12-24 ENCOUNTER — Ambulatory Visit: Payer: BC Managed Care – PPO | Admitting: Certified Registered"

## 2016-12-24 DIAGNOSIS — Z6841 Body Mass Index (BMI) 40.0 and over, adult: Secondary | ICD-10-CM | POA: Insufficient documentation

## 2016-12-24 DIAGNOSIS — I10 Essential (primary) hypertension: Secondary | ICD-10-CM | POA: Diagnosis not present

## 2016-12-24 DIAGNOSIS — G4730 Sleep apnea, unspecified: Secondary | ICD-10-CM | POA: Diagnosis not present

## 2016-12-24 DIAGNOSIS — N289 Disorder of kidney and ureter, unspecified: Secondary | ICD-10-CM | POA: Diagnosis not present

## 2016-12-24 DIAGNOSIS — I481 Persistent atrial fibrillation: Secondary | ICD-10-CM | POA: Diagnosis not present

## 2016-12-24 DIAGNOSIS — M1990 Unspecified osteoarthritis, unspecified site: Secondary | ICD-10-CM | POA: Insufficient documentation

## 2016-12-24 DIAGNOSIS — J45909 Unspecified asthma, uncomplicated: Secondary | ICD-10-CM | POA: Diagnosis not present

## 2016-12-24 HISTORY — DX: Unspecified atrial fibrillation: I48.91

## 2016-12-24 HISTORY — DX: Sleep apnea, unspecified: G47.30

## 2016-12-24 HISTORY — PX: CARDIOVERSION: EP1203

## 2016-12-24 SURGERY — CARDIOVERSION (CATH LAB)
Anesthesia: General

## 2016-12-24 MED ORDER — SODIUM CHLORIDE 0.9 % IV SOLN
INTRAVENOUS | Status: DC | PRN
  Administered 2016-12-24: 09:00:00 via INTRAVENOUS

## 2016-12-24 MED ORDER — PROPOFOL 10 MG/ML IV BOLUS
INTRAVENOUS | Status: DC | PRN
  Administered 2016-12-24: 100 mg via INTRAVENOUS
  Administered 2016-12-24: 50 mg via INTRAVENOUS
  Administered 2016-12-24: 30 mg via INTRAVENOUS
  Administered 2016-12-24: 20 mg via INTRAVENOUS

## 2016-12-24 MED ORDER — PROPOFOL 10 MG/ML IV BOLUS
INTRAVENOUS | Status: AC
  Filled 2016-12-24: qty 20

## 2016-12-24 NOTE — Anesthesia Post-op Follow-up Note (Cosign Needed)
Anesthesia QCDR form completed.        

## 2016-12-24 NOTE — Transfer of Care (Signed)
Immediate Anesthesia Transfer of Care Note  Patient: Luke Morgan  Procedure(s) Performed: Procedure(s): CARDIOVERSION (N/A)  Patient Location: spu  Anesthesia Type:General  Level of Consciousness: awake  Airway & Oxygen Therapy: Patient Spontanous Breathing and Patient connected to nasal cannula oxygen  Post-op Assessment: Report given to RN and Post -op Vital signs reviewed and stable  Post vital signs: Reviewed  Last Vitals:  Vitals:   12/24/16 0953 12/24/16 0955  BP:  110/74  Pulse: 79 87  Resp: 18 16  Temp:      Last Pain: There were no vitals filed for this visit.       Complications: No apparent anesthesia complications

## 2016-12-24 NOTE — Anesthesia Preprocedure Evaluation (Signed)
Anesthesia Evaluation  Patient identified by MRN, date of birth, ID band Patient awake    Reviewed: Allergy & Precautions, NPO status , Patient's Chart, lab work & pertinent test results, reviewed documented beta blocker date and time   Airway Mallampati: III  TM Distance: >3 FB     Dental  (+) Chipped   Pulmonary asthma , sleep apnea ,           Cardiovascular hypertension, Pt. on medications and Pt. on home beta blockers      Neuro/Psych    GI/Hepatic   Endo/Other  Morbid obesity  Renal/GU Renal disease     Musculoskeletal  (+) Arthritis ,   Abdominal   Peds  Hematology   Anesthesia Other Findings   Reproductive/Obstetrics                             Anesthesia Physical Anesthesia Plan  ASA: III  Anesthesia Plan: General   Post-op Pain Management:    Induction: Intravenous  PONV Risk Score and Plan:   Airway Management Planned:   Additional Equipment:   Intra-op Plan:   Post-operative Plan:   Informed Consent: I have reviewed the patients History and Physical, chart, labs and discussed the procedure including the risks, benefits and alternatives for the proposed anesthesia with the patient or authorized representative who has indicated his/her understanding and acceptance.     Plan Discussed with: CRNA  Anesthesia Plan Comments:         Anesthesia Quick Evaluation

## 2016-12-24 NOTE — CV Procedure (Signed)
Cardioversion procedure note For atrial fibrillation, persistent  Procedure Details:  Consent: Risks of procedure as well as the alternatives and risks of each were explained to the (patient/caregiver). Consent for procedure obtained.  Time Out: Verified patient identification, verified procedure, site/side was marked, verified correct patient position, special equipment/implants available, medications/allergies/relevent history reviewed, required imaging and test results available. Performed  Patient placed on cardiac monitor, pulse oximetry, supplemental oxygen as necessary.  Sedation given: propofol IV, Dr. Maisie Fus Pacer pads placed anterior and posterior chest.   Cardioverted 1 time(s).  Cardioverted at  200J. x2 anteroposterior pads Cardioverted at  200J. x2 anterolateral pads Synchronized biphasic Did not Convert   Evaluation: Findings: Post procedure EKG shows: atrial fibrillation Complications: None Patient did tolerate procedure well.  Time Spent Directly with the Patient:  45 minutes   Dossie Arbour, M.D., Ph.D.

## 2016-12-24 NOTE — Anesthesia Postprocedure Evaluation (Signed)
Anesthesia Post Note  Patient: Luke Morgan  Procedure(s) Performed: Procedure(s) (LRB): CARDIOVERSION (N/A)  Patient location during evaluation: Cath Lab Anesthesia Type: General Level of consciousness: awake and alert Pain management: pain level controlled Vital Signs Assessment: post-procedure vital signs reviewed and stable Respiratory status: spontaneous breathing, nonlabored ventilation, respiratory function stable and patient connected to nasal cannula oxygen Cardiovascular status: blood pressure returned to baseline and stable Postop Assessment: no signs of nausea or vomiting Anesthetic complications: no     Last Vitals:  Vitals:   12/24/16 1015 12/24/16 1030  BP: 110/84 122/79  Pulse: 76 75  Resp: 18 20  Temp:      Last Pain: There were no vitals filed for this visit.               Mehreen Azizi S

## 2016-12-27 NOTE — H&P (Signed)
H&P Addendum, pre-cardioversion  Patient was seen and evaluated prior to -cardioversion procedure Symptoms, prior testing details again confirmed with the patient Patient examined, no significant change from prior exam Lab work reviewed in detail personally by myself Patient understands risk and benefit of the procedure, willing to proceed  Signed, Tim Gollan, MD, Ph.D [facility]  

## 2016-12-30 ENCOUNTER — Encounter: Payer: Self-pay | Admitting: *Deleted

## 2016-12-30 ENCOUNTER — Ambulatory Visit (INDEPENDENT_AMBULATORY_CARE_PROVIDER_SITE_OTHER): Payer: BC Managed Care – PPO | Admitting: Internal Medicine

## 2016-12-30 ENCOUNTER — Encounter: Payer: Self-pay | Admitting: Internal Medicine

## 2016-12-30 ENCOUNTER — Telehealth: Payer: Self-pay | Admitting: Internal Medicine

## 2016-12-30 VITALS — BP 110/74 | HR 103 | Ht 77.0 in | Wt 392.5 lb

## 2016-12-30 DIAGNOSIS — I481 Persistent atrial fibrillation: Secondary | ICD-10-CM

## 2016-12-30 DIAGNOSIS — I4819 Other persistent atrial fibrillation: Secondary | ICD-10-CM

## 2016-12-30 MED ORDER — FLECAINIDE ACETATE 100 MG PO TABS: 100.0000 mg | ORAL_TABLET | Freq: Two times a day (BID) | ORAL | 3 refills | Status: DC

## 2016-12-30 MED ORDER — PROPAFENONE HCL ER 225 MG PO CP12: 225.0000 mg | ORAL_CAPSULE | Freq: Two times a day (BID) | ORAL | 6 refills | Status: DC

## 2016-12-30 NOTE — Progress Notes (Signed)
Patient Care Team: Barbette Reichmann, MD as PCP - General (Internal Medicine)   HPI  Luke Morgan is a 66 y.o. male Seen again because of atrial fibrillation and failed to cardiovert.  [facility] He is feeling a little bit Gen. some concomitant diuresis. He remained short of breath and quite limited.  Records and Results Reviewed Echocardiogram obtained today demonstrated an ejection fraction of 43% Wall thickness normal   Date Cr K Hgb  7/18  0.88 4.0 15.5           Past Medical History:  Diagnosis Date  . Asthma   . Atrial fibrillation (HCC)   . Hypertension   . Kidney stones   . Obesity   . Osteoarthritis   . Sleep apnea     Past Surgical History:  Procedure Laterality Date  . CARDIOVERSION N/A 12/24/2016   Procedure: CARDIOVERSION;  Surgeon: Antonieta Iba, MD;  Location: [facility] ORS;  Service: Cardiovascular;  Laterality: N/A;    Current Outpatient Prescriptions  Medication Sig Dispense Refill  . acetaminophen (TYLENOL) 500 MG tablet Take 500 mg by mouth 2 (two) times daily.     Marland Kitchen allopurinol (ZYLOPRIM) 300 MG tablet Take 300 mg by mouth daily.    Marland Kitchen apixaban (ELIQUIS) 5 MG TABS tablet Take 5 mg by mouth 2 (two) times daily.    . Fluticasone-Salmeterol (ADVAIR) 100-50 MCG/DOSE AEPB Inhale 1 puff into the lungs 2 (two) times daily as needed.     . furosemide (LASIX) 20 MG tablet Take 1 tablet (20 mg total) by mouth daily. 30 tablet   . gabapentin (NEURONTIN) 300 MG capsule Take 300 mg by mouth 3 (three) times daily.    . hydrALAZINE (APRESOLINE) 50 MG tablet Take 50 mg by mouth 3 (three) times daily.    Marland Kitchen losartan-hydrochlorothiazide (HYZAAR) 100-12.5 MG tablet Take 1 tablet by mouth 2 (two) times daily.     . metFORMIN (GLUCOPHAGE) 500 MG tablet Take by mouth daily with breakfast.    . metoprolol succinate (TOPROL-XL) 100 MG 24 hr tablet Take 100 mg by mouth daily. Take with or immediately following a meal.    . Multiple Vitamins-Minerals  (MULTIVITAMIN WITH MINERALS) tablet Take 1 tablet by mouth daily.     No current facility-administered medications for this visit.     Allergies  Allergen Reactions  . Penicillins     Unknown reaction Has patient had a PCN reaction causing immediate rash, facial/tongue/throat swelling, SOB or lightheadedness with hypotension: Yes Has patient had a PCN reaction causing severe rash involving mucus membranes or skin necrosis: No Has patient had a PCN reaction that required hospitalization No Has patient had a PCN reaction occurring within the last 10 years: No If all of the above answers are "NO", then may proceed with Cephalosporin use.      Review of Systems negative except from HPI and PMH  Physical Exam BP 110/74 (BP Location: Left Arm, Patient Position: Sitting, Cuff Size: Large)   Pulse (!) 103   Ht 6\' 5"  (1.956 m)   Wt (!) 392 lb 8 oz (178 kg)   BMI 46.54 kg/m  Well developed and well nourished in no acute distress HENT normal E scleral and icterus clear Neck Supple JVP flat; carotids brisk and full Clear to ausculation irregularly irRegular rate and rhythm, no murmurs gallops or rub Soft with active bowel sounds No clubbing cyanosis 2+ Edema Alert and oriented, grossly normal motor and sensory function Skin  Warm and Dry  ECG: AF  @103             Intervals  -/84/35  Axis 79     Assessment and  Plan  Atrial fibrillation-persistent  Congestive heart failure presumably HFpEF  Morbid obesity   Hypertension   Cardiomyopathy-nonischemic presumably  This failed cardioversion either represents ERAF i.e. early recurrence of AF or fail to cardiovert. In either case, I think he becomes incumbent to try it again either with antiarrhythmic therapy to address the former or other strategies to accomplish the latter. These would include double defibrillators chest pressure and or ibutilide. I have reviewed this with the family. We will plan to proceed. As relates to the  former, we will start propafenone realizing that may increase his defibrillation thresholds. It is reasonably safe to use in patients with mild LV dysfunction He is still volume overloaded. Will increase his furosemide.--40 and then up to 60 or 80 as needed to prompt brisk diuresis and do this for 4 out of 7 days.  Is not clear as to the cause of his cardiomyopathy. He may be rate related. We will make efforts to establish sinus rhythm as noted and there may be some reversal of his LV dysfunction.  Current medicines are reviewed at length with the patient today .  The patient does not  have concerns regarding medicines.  More than 50% of 40 min was spent in counseling related to the above

## 2016-12-30 NOTE — Telephone Encounter (Signed)
Pharmacy wants to clarify rx for tablet vs capsule and if extended release please call to clarify rx for Rhythmol

## 2016-12-30 NOTE — Telephone Encounter (Signed)
I called and spoke with Jody, pharmacist- advised extended release capsules on rhythmol.

## 2016-12-30 NOTE — Patient Instructions (Addendum)
Medication Instructions: - Your physician has recommended you make the following change in your medication: 1) Start propafenone 225 mg- take 1 tablet by mouth twice daily (1st dose on Friday morning 12/31/16) 2) Increase lasix (furosemide) 20 mg- take 2 tablets (40 mg) by mouth once daily x 4 out of the next 7 days  Labwork: - none ordered  Procedures/Testing: - Your physician has recommended that you have a Cardioversion (DCCV). Electrical Cardioversion uses a jolt of electricity to your heart either through paddles or wired patches attached to your chest. This is a controlled, usually prescheduled, procedure. Defibrillation is done under light anesthesia in the hospital, and you usually go home the day of the procedure. This is done to get your heart back into a normal rhythm. You are not awake for the procedure. Please see the instruction sheet given to you today.  Follow-Up: - Your physician recommends that you schedule a follow-up appointment in: 4 weeks (from 01/04/17) with Dr. Graciela Husbands.   Any Additional Special Instructions Will Be Listed Below (If Applicable).     If you need a refill on your cardiac medications before your next appointment, please call your pharmacy.

## 2016-12-31 ENCOUNTER — Other Ambulatory Visit: Payer: Self-pay | Admitting: Internal Medicine

## 2016-12-31 DIAGNOSIS — I4819 Other persistent atrial fibrillation: Secondary | ICD-10-CM

## 2017-01-04 ENCOUNTER — Ambulatory Visit (HOSPITAL_COMMUNITY): Payer: BC Managed Care – PPO | Admitting: Certified Registered Nurse Anesthetist

## 2017-01-04 ENCOUNTER — Encounter (HOSPITAL_COMMUNITY): Payer: Self-pay | Admitting: *Deleted

## 2017-01-04 ENCOUNTER — Ambulatory Visit (HOSPITAL_COMMUNITY)
Admission: RE | Admit: 2017-01-04 | Discharge: 2017-01-04 | Disposition: A | Discharge status: Home / Self Care | Payer: BC Managed Care – PPO | Source: Ambulatory Visit | Attending: Cardiology | Admitting: Cardiology

## 2017-01-04 ENCOUNTER — Encounter (HOSPITAL_COMMUNITY)
Admission: RE | Disposition: A | Discharge status: Home / Self Care | Payer: Self-pay | Source: Ambulatory Visit | Attending: Cardiology

## 2017-01-04 DIAGNOSIS — J45909 Unspecified asthma, uncomplicated: Secondary | ICD-10-CM | POA: Insufficient documentation

## 2017-01-04 DIAGNOSIS — I4891 Unspecified atrial fibrillation: Secondary | ICD-10-CM | POA: Diagnosis not present

## 2017-01-04 DIAGNOSIS — Z7984 Long term (current) use of oral hypoglycemic drugs: Secondary | ICD-10-CM | POA: Diagnosis not present

## 2017-01-04 DIAGNOSIS — M1990 Unspecified osteoarthritis, unspecified site: Secondary | ICD-10-CM | POA: Insufficient documentation

## 2017-01-04 DIAGNOSIS — I5032 Chronic diastolic (congestive) heart failure: Secondary | ICD-10-CM | POA: Insufficient documentation

## 2017-01-04 DIAGNOSIS — Z7901 Long term (current) use of anticoagulants: Secondary | ICD-10-CM | POA: Insufficient documentation

## 2017-01-04 DIAGNOSIS — G4730 Sleep apnea, unspecified: Secondary | ICD-10-CM | POA: Diagnosis not present

## 2017-01-04 DIAGNOSIS — I110 Hypertensive heart disease with heart failure: Secondary | ICD-10-CM | POA: Insufficient documentation

## 2017-01-04 DIAGNOSIS — Z880 Allergy status to penicillin: Secondary | ICD-10-CM | POA: Diagnosis not present

## 2017-01-04 DIAGNOSIS — Z6841 Body Mass Index (BMI) 40.0 and over, adult: Secondary | ICD-10-CM | POA: Insufficient documentation

## 2017-01-04 DIAGNOSIS — I429 Cardiomyopathy, unspecified: Secondary | ICD-10-CM | POA: Diagnosis not present

## 2017-01-04 DIAGNOSIS — I4819 Other persistent atrial fibrillation: Secondary | ICD-10-CM

## 2017-01-04 DIAGNOSIS — Z79899 Other long term (current) drug therapy: Secondary | ICD-10-CM | POA: Insufficient documentation

## 2017-01-04 DIAGNOSIS — I481 Persistent atrial fibrillation: Secondary | ICD-10-CM | POA: Diagnosis not present

## 2017-01-04 DIAGNOSIS — Z87442 Personal history of urinary calculi: Secondary | ICD-10-CM | POA: Diagnosis not present

## 2017-01-04 HISTORY — PX: CARDIOVERSION: SHX1299

## 2017-01-04 SURGERY — CARDIOVERSION
Anesthesia: General

## 2017-01-04 MED ORDER — SODIUM CHLORIDE 0.9 % IV SOLN
INTRAVENOUS | Status: DC
  Administered 2017-01-04: 11:00:00 via INTRAVENOUS

## 2017-01-04 MED ORDER — PROPOFOL 10 MG/ML IV BOLUS
INTRAVENOUS | Status: DC | PRN
  Administered 2017-01-04 (×2): 10 mg via INTRAVENOUS
  Administered 2017-01-04: 40 mg via INTRAVENOUS
  Administered 2017-01-04 (×2): 20 mg via INTRAVENOUS
  Administered 2017-01-04 (×2): 10 mg via INTRAVENOUS
  Administered 2017-01-04 (×2): 20 mg via INTRAVENOUS

## 2017-01-04 NOTE — Anesthesia Preprocedure Evaluation (Signed)
Anesthesia Evaluation  Patient identified by MRN, date of birth, ID band Patient awake    Reviewed: Allergy & Precautions, NPO status , Patient's Chart, lab work & pertinent test results  Airway Mallampati: III  TM Distance: <3 FB Neck ROM: Full    Dental no notable dental hx.    Pulmonary asthma , sleep apnea ,    breath sounds clear to auscultation       Cardiovascular hypertension, + dysrhythmias  Rhythm:Irregular Rate:Normal     Neuro/Psych    GI/Hepatic   Endo/Other  Morbid obesity  Renal/GU      Musculoskeletal  (+) Arthritis ,   Abdominal (+) + obese,   Peds  Hematology   Anesthesia Other Findings   Reproductive/Obstetrics                             Anesthesia Physical Anesthesia Plan  ASA: III  Anesthesia Plan: General   Post-op Pain Management:    Induction: Intravenous  PONV Risk Score and Plan: 2 and Ondansetron and Dexamethasone  Airway Management Planned: Mask  Additional Equipment:   Intra-op Plan:   Post-operative Plan:   Informed Consent: I have reviewed the patients History and Physical, chart, labs and discussed the procedure including the risks, benefits and alternatives for the proposed anesthesia with the patient or authorized representative who has indicated his/her understanding and acceptance.   Dental advisory given  Plan Discussed with: CRNA  Anesthesia Plan Comments:         Anesthesia Quick Evaluation

## 2017-01-04 NOTE — Transfer of Care (Signed)
Immediate Anesthesia Transfer of Care Note  Patient: Karlyn Agee III  Procedure(s) Performed: Procedure(s): CARDIOVERSION (N/A)  Patient Location: PACU  Anesthesia Type:MAC  Level of Consciousness: awake, alert  and oriented  Airway & Oxygen Therapy: Patient Spontanous Breathing  Post-op Assessment: Report given to RN and Post -op Vital signs reviewed and stable  Post vital signs: Reviewed and stable  Last Vitals:  Vitals:   01/04/17 1156 01/04/17 1157  BP: (!) 120/57   Pulse: (!) 57 60  Resp: 17 17  Temp:    SpO2: 98% 97%    Last Pain:  Vitals:   01/04/17 0932  TempSrc: Oral         Complications: No apparent anesthesia complications

## 2017-01-04 NOTE — Anesthesia Postprocedure Evaluation (Signed)
Anesthesia Post Note  Patient: Luke Morgan  Procedure(s) Performed: Procedure(s) (LRB): CARDIOVERSION (N/A)     Patient location during evaluation: Endoscopy Anesthesia Type: General Level of consciousness: awake and alert Pain management: pain level controlled Vital Signs Assessment: post-procedure vital signs reviewed and stable Respiratory status: spontaneous breathing, nonlabored ventilation, respiratory function stable and patient connected to nasal cannula oxygen Cardiovascular status: stable Postop Assessment: no signs of nausea or vomiting Anesthetic complications: no    Last Vitals:  Vitals:   01/04/17 1157 01/04/17 1201  BP:  (!) 102/55  Pulse: 60 64  Resp: 17 16  Temp:  36.7 C  SpO2: 97% 95%    Last Pain:  Vitals:   01/04/17 1201  TempSrc: Oral                 Jt Brabec

## 2017-01-04 NOTE — CV Procedure (Signed)
Preop Dx Afib  Post op DX  NSR   Procedure  DC Cardioversion   Pt was sedated by anesthesia receiving 160 mg Propafol  A synchronized shock 200 joules filed to estored sinus Rhythm 360 jopule Afib >>NSR after 2 min > Afib  360 J Afib >>NSR   Pt tolerated without difficulty

## 2017-01-04 NOTE — H&P (View-Only) (Signed)
Patient Care Team: Barbette Reichmann, MD as PCP - General (Internal Medicine)   HPI  Luke Morgan is a 66 y.o. male Seen again because of atrial fibrillation and failed to cardiovert.  [facility] He is feeling a little bit Gen. some concomitant diuresis. He remained short of breath and quite limited.  Records and Results Reviewed Echocardiogram obtained today demonstrated an ejection fraction of 43% Wall thickness normal   Date Cr K Hgb  7/18  0.88 4.0 15.5           Past Medical History:  Diagnosis Date  . Asthma   . Atrial fibrillation (HCC)   . Hypertension   . Kidney stones   . Obesity   . Osteoarthritis   . Sleep apnea     Past Surgical History:  Procedure Laterality Date  . CARDIOVERSION N/A 12/24/2016   Procedure: CARDIOVERSION;  Surgeon: Antonieta Iba, MD;  Location: [facility] ORS;  Service: Cardiovascular;  Laterality: N/A;    Current Outpatient Prescriptions  Medication Sig Dispense Refill  . acetaminophen (TYLENOL) 500 MG tablet Take 500 mg by mouth 2 (two) times daily.     Marland Kitchen allopurinol (ZYLOPRIM) 300 MG tablet Take 300 mg by mouth daily.    Marland Kitchen apixaban (ELIQUIS) 5 MG TABS tablet Take 5 mg by mouth 2 (two) times daily.    . Fluticasone-Salmeterol (ADVAIR) 100-50 MCG/DOSE AEPB Inhale 1 puff into the lungs 2 (two) times daily as needed.     . furosemide (LASIX) 20 MG tablet Take 1 tablet (20 mg total) by mouth daily. 30 tablet   . gabapentin (NEURONTIN) 300 MG capsule Take 300 mg by mouth 3 (three) times daily.    . hydrALAZINE (APRESOLINE) 50 MG tablet Take 50 mg by mouth 3 (three) times daily.    Marland Kitchen losartan-hydrochlorothiazide (HYZAAR) 100-12.5 MG tablet Take 1 tablet by mouth 2 (two) times daily.     . metFORMIN (GLUCOPHAGE) 500 MG tablet Take by mouth daily with breakfast.    . metoprolol succinate (TOPROL-XL) 100 MG 24 hr tablet Take 100 mg by mouth daily. Take with or immediately following a meal.    . Multiple Vitamins-Minerals  (MULTIVITAMIN WITH MINERALS) tablet Take 1 tablet by mouth daily.     No current facility-administered medications for this visit.     Allergies  Allergen Reactions  . Penicillins     Unknown reaction Has patient had a PCN reaction causing immediate rash, facial/tongue/throat swelling, SOB or lightheadedness with hypotension: Yes Has patient had a PCN reaction causing severe rash involving mucus membranes or skin necrosis: No Has patient had a PCN reaction that required hospitalization No Has patient had a PCN reaction occurring within the last 10 years: No If all of the above answers are "NO", then may proceed with Cephalosporin use.      Review of Systems negative except from HPI and PMH  Physical Exam BP 110/74 (BP Location: Left Arm, Patient Position: Sitting, Cuff Size: Large)   Pulse (!) 103   Ht 6\' 5"  (1.956 m)   Wt (!) 392 lb 8 oz (178 kg)   BMI 46.54 kg/m  Well developed and well nourished in no acute distress HENT normal E scleral and icterus clear Neck Supple JVP flat; carotids brisk and full Clear to ausculation irregularly irRegular rate and rhythm, no murmurs gallops or rub Soft with active bowel sounds No clubbing cyanosis 2+ Edema Alert and oriented, grossly normal motor and sensory function Skin  Warm and Dry  ECG: AF  @103             Intervals  -/84/35  Axis 79     Assessment and  Plan  Atrial fibrillation-persistent  Congestive heart failure presumably HFpEF  Morbid obesity   Hypertension   Cardiomyopathy-nonischemic presumably  This failed cardioversion either represents ERAF i.e. early recurrence of AF or fail to cardiovert. In either case, I think he becomes incumbent to try it again either with antiarrhythmic therapy to address the former or other strategies to accomplish the latter. These would include double defibrillators chest pressure and or ibutilide. I have reviewed this with the family. We will plan to proceed. As relates to the  former, we will start propafenone realizing that may increase his defibrillation thresholds. It is reasonably safe to use in patients with mild LV dysfunction He is still volume overloaded. Will increase his furosemide.--40 and then up to 60 or 80 as needed to prompt brisk diuresis and do this for 4 out of 7 days.  Is not clear as to the cause of his cardiomyopathy. He may be rate related. We will make efforts to establish sinus rhythm as noted and there may be some reversal of his LV dysfunction.  Current medicines are reviewed at length with the patient today .  The patient does not  have concerns regarding medicines.  More than 50% of 40 min was spent in counseling related to the above

## 2017-01-04 NOTE — Interval H&P Note (Signed)
History and Physical Interval Note:  01/04/2017 11:39 AM  Luke Morgan  has presented today for surgery, with the diagnosis of AFIB  The various methods of treatment have been discussed with the patient and family. After consideration of risks, benefits and other options for treatment, the patient has consented to  Procedure(s): CARDIOVERSION (N/A) as a surgical intervention .  The patient's history has been reviewed, patient examined, no change in status, stable for surgery.  I have reviewed the patient's chart and labs.  Questions were answered to the patient's satisfaction.     Sherryl Manges

## 2017-01-05 ENCOUNTER — Encounter (HOSPITAL_COMMUNITY): Payer: Self-pay | Admitting: Internal Medicine

## 2017-01-09 ENCOUNTER — Encounter: Payer: Self-pay | Admitting: Internal Medicine

## 2017-01-10 ENCOUNTER — Other Ambulatory Visit: Payer: Self-pay | Admitting: *Deleted

## 2017-01-17 ENCOUNTER — Encounter: Payer: Self-pay | Admitting: Internal Medicine

## 2017-01-20 ENCOUNTER — Other Ambulatory Visit: Payer: Self-pay

## 2017-01-20 ENCOUNTER — Other Ambulatory Visit: Payer: Self-pay | Admitting: *Deleted

## 2017-01-20 MED ORDER — FUROSEMIDE 20 MG PO TABS: ORAL_TABLET | ORAL | 6 refills | Status: DC

## 2017-01-20 NOTE — Telephone Encounter (Signed)
RX updated and sent to the pharmacy.  

## 2017-01-20 NOTE — Telephone Encounter (Signed)
Refill for Furosemide 20 mg not sent in yet. Dr. Odessa Fleming last office visit, the patient was instructed of a new dosing on Furosemide. Please verify last office note of Dr. Odessa Fleming and contact the patient for what dose should be refilled.

## 2017-01-20 NOTE — Telephone Encounter (Deleted)
Refill sent for Furosemide 20 mg  

## 2017-01-27 ENCOUNTER — Ambulatory Visit: Payer: BC Managed Care – PPO | Admitting: Internal Medicine

## 2017-02-03 ENCOUNTER — Ambulatory Visit (INDEPENDENT_AMBULATORY_CARE_PROVIDER_SITE_OTHER): Payer: BC Managed Care – PPO | Admitting: Internal Medicine

## 2017-02-03 ENCOUNTER — Encounter: Payer: Self-pay | Admitting: Internal Medicine

## 2017-02-03 VITALS — BP 108/78 | HR 79 | Ht 77.0 in | Wt 397.2 lb

## 2017-02-03 DIAGNOSIS — I4819 Other persistent atrial fibrillation: Secondary | ICD-10-CM

## 2017-02-03 DIAGNOSIS — I481 Persistent atrial fibrillation: Secondary | ICD-10-CM | POA: Diagnosis not present

## 2017-02-03 NOTE — Patient Instructions (Signed)
Medication Instructions: - Your physician recommends that you continue on your current medications as directed. Please refer to the Current Medication list given to you today.  Labwork: - none ordered  Procedures/Testing: - none ordered  Follow-Up: - Please call the office back in mid October if you would like to pursue admission for Tikosyn loading and we will arrange this for you. [phone]  Any Additional Special Instructions Will Be Listed Below (If Applicable).     If you need a refill on your cardiac medications before your next appointment, please call your pharmacy.

## 2017-02-03 NOTE — Progress Notes (Signed)
Patient Care Team: Barbette Reichmann, MD as PCP - General (Internal Medicine)   HPI  Luke Morgan is a 66 y.o. male Seen in followup of atrial fibrillation with initial failure to cardiovert.  We undertook chest compression facilitated DCCV 8/18 with propafenone with successful restoration of sinus;  He reverted to afib within few weeks  No appreciable change in symptoms  SOB and edema chronic   Struggling to lose weight  Records and Results Reviewed  Echocardiogram obtained from spring summer 2018 demonstrated an ejection fraction of 43% Wall thickness normal   Date Cr K Hgb  7/18  0.88 4.0 15.5          Thromboembolic risk factors ( age -73 , HTN-1,) for a CHADSVASc Score of  2 Past Medical History:  Diagnosis Date  . Asthma   . Atrial fibrillation (HCC)   . Hypertension   . Kidney stones   . Obesity   . Osteoarthritis   . Sleep apnea     Past Surgical History:  Procedure Laterality Date  . CARDIOVERSION N/A 12/24/2016   Procedure: CARDIOVERSION;  Surgeon: Antonieta Iba, MD;  Location: [facility] ORS;  Service: Cardiovascular;  Laterality: N/A;  . CARDIOVERSION N/A 01/04/2017   Procedure: CARDIOVERSION;  Surgeon: Duke Salvia, MD;  Location: [facility] ENDOSCOPY;  Service: Cardiovascular;  Laterality: N/A;    Current Outpatient Prescriptions  Medication Sig Dispense Refill  . acetaminophen (TYLENOL) 500 MG tablet Take 500 mg by mouth 2 (two) times daily.     Marland Kitchen allopurinol (ZYLOPRIM) 300 MG tablet Take 300 mg by mouth daily.    Marland Kitchen apixaban (ELIQUIS) 5 MG TABS tablet Take 5 mg by mouth 2 (two) times daily.    . Fluticasone-Salmeterol (ADVAIR) 100-50 MCG/DOSE AEPB Inhale 1 puff into the lungs 2 (two) times daily as needed.     . furosemide (LASIX) 20 MG tablet Take 1 tablet (20 mg) by mouth once daily, may take 1 extra tablet (20 mg) once daily as needed for increased shortness of breath/ swelling 45 tablet 6  . gabapentin (NEURONTIN) 300 MG capsule Take 300 mg  by mouth 3 (three) times daily.    . hydrALAZINE (APRESOLINE) 50 MG tablet Take 50 mg by mouth 3 (three) times daily.    Marland Kitchen losartan-hydrochlorothiazide (HYZAAR) 100-12.5 MG tablet Take 1 tablet by mouth 2 (two) times daily.     . metFORMIN (GLUCOPHAGE) 500 MG tablet Take by mouth daily with breakfast.    . metoprolol succinate (TOPROL-XL) 100 MG 24 hr tablet Take 100 mg by mouth daily. Take with or immediately following a meal.    . Multiple Vitamins-Minerals (MULTIVITAMIN WITH MINERALS) tablet Take 1 tablet by mouth daily.    . propafenone (RYTHMOL SR) 225 MG 12 hr capsule Take 1 capsule (225 mg total) by mouth 2 (two) times daily. 60 capsule 6   No current facility-administered medications for this visit.     Allergies  Allergen Reactions  . Penicillins     Unknown reaction Has patient had a PCN reaction causing immediate rash, facial/tongue/throat swelling, SOB or lightheadedness with hypotension: Yes Has patient had a PCN reaction causing severe rash involving mucus membranes or skin necrosis: No Has patient had a PCN reaction that required hospitalization No Has patient had a PCN reaction occurring within the last 10 years: No If all of the above answers are "NO", then may proceed with Cephalosporin use.      Review of Systems negative except  from HPI and PMH  Physical Exam BP 108/78 (BP Location: Left Arm, Patient Position: Sitting, Cuff Size: Large)   Pulse 79   Ht  (1.956 m)   Wt (!) 397 lb 4 oz (180.2 kg)   BMI 47.11 kg/m  Well developed and Morbidly obese  in no acute distress HENT normal Neck supple with JVP-flat Carotids brisk and full without bruits Clear Irregular rate and rhythm, no murmurs or gallops Abd-soft with active BS without hepatomegaly No Clubbing cyanosis 2+edema Skin-warm and dry A & Oriented  Grossly normal sensory and motor function   ECG: AF              Intervals  -/09/42  Axis 11     Assessment and  Plan  Atrial  fibrillation-persistent  Congestive heart failure presumably HFpEF  Morbid obesity   Hypertension   Cardiomyopathy-nonischemic presumably  We have reviewed the physiology of atrial fibrillation. Specifically, we have reviewed the timing and priming function of the atrium, the impact on rate because of atrial fibrillation high rates and our efforts to attenuate ventricular response by pharmacological manipulation of the AV node.  We discussed the potential effects on exercise tolerance related to rate irregularity and loss of priming function. We discussed potential risks of thromboembolism. The pt has a CHADS-VASc score of 1-2. Furthermore we discussed the alternative of rhythm control including the potential benefits and risks of proarrhythmia and the implications of morbid obesity in rhtyhm contolloing strategies   He would like to consider the options and specifically decide as to whether he would like to be admitted for dofetilide, realizing he will need to defer his back surgery for at least 4 weeks after this  We discussed diet and salt intake,  His efforts for weight loss need to be reiforced with exercise  More than 50% of 45 min was spent in counseling related to the above

## 2017-02-24 ENCOUNTER — Telehealth: Payer: Self-pay | Admitting: Internal Medicine

## 2017-02-24 NOTE — Telephone Encounter (Signed)
Pt is calling regarding scheduling a "procedure that has to be done by Dr. Graciela Husbands to administer medication in the hospital". Please call.

## 2017-02-24 NOTE — Telephone Encounter (Signed)
At what point should the patient stop the HCTZ- Dr. Graciela Husbands is out until 03/07/17 and I'm out starting tomorrow for delivery.   Stacy/ Megan can ya'll follow up with Dr. Graciela Husbands when he's back- I feel like there is a lot to this and I don't want it to get missed.   Thanks!

## 2017-02-24 NOTE — Telephone Encounter (Signed)
He will need to stop HCTZ 3 days prior to Tikosyn start.

## 2017-02-24 NOTE — Telephone Encounter (Signed)
I spoke with the patient- he would like to go in for Tikosyn admission on 04/10/17 per Dr. Graciela Husbands. I advised him I would forward to the a-fib clinic to confirm this date will work and we should be back in touch with him today. He is agreeable.

## 2017-02-24 NOTE — Telephone Encounter (Signed)
Correction on date patient wants to go in for Tikosyn loading- this would be Wednesday 03/30/17.  I have notified the patient that we will need to confirm with Dr. Graciela Husbands:  1) Just take plain losartan 100 mg BID starting 3 days prior to the procedure  2) I did not mention a TEE to the patient as will wait for feedback from Dr. Graciela Husbands on this.   - the patient is aware to not miss any doses of Eliquis especially in the 3 weeks leading up to his admission - he is aware to avoid benadryl use while on tikosyn and in the 2-3 days leading up to this.   He is aware I will be going out on maternity leave and therefore a-fib clinic will follow up with him about his admission.  Will also forward to Sherri/ [place] to follow up with Dr. Graciela Husbands when he is back in the office on 03/08/17.  He is agreeable.

## 2017-02-24 NOTE — Telephone Encounter (Addendum)
Medication list reviewed in anticipation of upcoming Tikosyn initiation. Patient is taking losartan-HCTZ, the HCTZ will need to be discontinued prior to Tikosyn start. Pt is also taking Rhythmol and will need to stop this 3 days prior to Tikosyn start.   Patient is anticoagulated on Eliquis 5mg  BID on the appropriate dose, however pt weighs 400 lbs. There is very limited data regarding the efficacy of Eliquis in morbidly obese patients. 40% of patients in [facility] trial had BMI > 30 with comparable efficacy, however there is no further stratification for patients significantly above that BMI cutoff. May be worth switching to warfarin, getting a TEE prior or checking an antiXa level.  Please ensure that patient has not missed any anticoagulation doses in the 3 weeks prior to Tikosyn initiation. Patient will need to be counseled to avoid use of Benadryl while on Tikosyn and in the 2-3 days prior to Tikosyn initiation.

## 2017-03-08 ENCOUNTER — Encounter: Payer: Self-pay | Admitting: Internal Medicine

## 2017-03-09 ENCOUNTER — Other Ambulatory Visit (HOSPITAL_COMMUNITY): Payer: Self-pay | Admitting: *Deleted

## 2017-03-09 MED ORDER — LOSARTAN POTASSIUM 100 MG PO TABS: 100.0000 mg | ORAL_TABLET | Freq: Two times a day (BID) | ORAL | 3 refills | Status: DC

## 2017-03-09 NOTE — Addendum Note (Signed)
Addended by: Shona Simpson on: 03/09/2017 11:35 AM   Modules accepted: Orders

## 2017-03-09 NOTE — Telephone Encounter (Signed)
Patient scheduled for November 6th for tikosyn admission -- admission process reviewed with patient. He will switch to losartan 100mg  BID 3 days prior to admission this prescription was sent into his pharmacy of choice. He is reminded not to miss any doses of Eliquis. Per Dr. Graciela Husbands there is no need for TEE prior to admission as long as patient is compliant with Eliquis. Patient will check on pricing of drug to ensure affordability. Patient stopped Propafenone after visit with Dr. Graciela Husbands on 02/03/17. Will call if issues prior to admission on 11/6.

## 2017-03-14 ENCOUNTER — Encounter: Payer: Self-pay | Admitting: Internal Medicine

## 2017-03-22 ENCOUNTER — Encounter (HOSPITAL_COMMUNITY): Payer: Self-pay | Admitting: Nurse Practitioner

## 2017-03-29 ENCOUNTER — Other Ambulatory Visit: Payer: Self-pay

## 2017-03-29 ENCOUNTER — Encounter (HOSPITAL_COMMUNITY): Payer: Self-pay | Admitting: Nurse Practitioner

## 2017-03-29 ENCOUNTER — Ambulatory Visit (HOSPITAL_COMMUNITY)
Admission: RE | Admit: 2017-03-29 | Discharge: 2017-03-29 | Disposition: A | Discharge status: Home / Self Care | Payer: Medicare Other | Source: Ambulatory Visit | Attending: Nurse Practitioner | Admitting: Nurse Practitioner

## 2017-03-29 ENCOUNTER — Inpatient Hospital Stay (HOSPITAL_COMMUNITY)
Admission: RE | Admit: 2017-03-29 | Discharge: 2017-04-01 | DRG: 309 | Disposition: A | Discharge status: Home / Self Care | Payer: Medicare Other | Source: Ambulatory Visit | Attending: Internal Medicine | Admitting: Internal Medicine

## 2017-03-29 ENCOUNTER — Encounter (HOSPITAL_COMMUNITY): Payer: Self-pay | Admitting: Physician Assistant

## 2017-03-29 VITALS — BP 126/74 | HR 93 | Ht 77.0 in | Wt >= 6400 oz

## 2017-03-29 DIAGNOSIS — I4819 Other persistent atrial fibrillation: Secondary | ICD-10-CM

## 2017-03-29 DIAGNOSIS — M4800 Spinal stenosis, site unspecified: Secondary | ICD-10-CM | POA: Diagnosis present

## 2017-03-29 DIAGNOSIS — I481 Persistent atrial fibrillation: Principal | ICD-10-CM

## 2017-03-29 DIAGNOSIS — Z885 Allergy status to narcotic agent status: Secondary | ICD-10-CM

## 2017-03-29 DIAGNOSIS — Z7984 Long term (current) use of oral hypoglycemic drugs: Secondary | ICD-10-CM | POA: Diagnosis not present

## 2017-03-29 DIAGNOSIS — G4733 Obstructive sleep apnea (adult) (pediatric): Secondary | ICD-10-CM | POA: Diagnosis not present

## 2017-03-29 DIAGNOSIS — M1990 Unspecified osteoarthritis, unspecified site: Secondary | ICD-10-CM | POA: Diagnosis present

## 2017-03-29 DIAGNOSIS — I4892 Unspecified atrial flutter: Secondary | ICD-10-CM | POA: Diagnosis not present

## 2017-03-29 DIAGNOSIS — Z6841 Body Mass Index (BMI) 40.0 and over, adult: Secondary | ICD-10-CM

## 2017-03-29 DIAGNOSIS — M549 Dorsalgia, unspecified: Secondary | ICD-10-CM | POA: Diagnosis present

## 2017-03-29 DIAGNOSIS — I5030 Unspecified diastolic (congestive) heart failure: Secondary | ICD-10-CM | POA: Diagnosis present

## 2017-03-29 DIAGNOSIS — I429 Cardiomyopathy, unspecified: Secondary | ICD-10-CM | POA: Diagnosis not present

## 2017-03-29 DIAGNOSIS — Z5181 Encounter for therapeutic drug level monitoring: Secondary | ICD-10-CM | POA: Diagnosis not present

## 2017-03-29 DIAGNOSIS — J45909 Unspecified asthma, uncomplicated: Secondary | ICD-10-CM | POA: Diagnosis not present

## 2017-03-29 DIAGNOSIS — R402413 Glasgow coma scale score 13-15, at hospital admission: Secondary | ICD-10-CM | POA: Diagnosis present

## 2017-03-29 DIAGNOSIS — Z7901 Long term (current) use of anticoagulants: Secondary | ICD-10-CM | POA: Diagnosis not present

## 2017-03-29 DIAGNOSIS — I4891 Unspecified atrial fibrillation: Secondary | ICD-10-CM | POA: Diagnosis not present

## 2017-03-29 DIAGNOSIS — Z79899 Other long term (current) drug therapy: Secondary | ICD-10-CM

## 2017-03-29 DIAGNOSIS — Z7951 Long term (current) use of inhaled steroids: Secondary | ICD-10-CM

## 2017-03-29 DIAGNOSIS — G8929 Other chronic pain: Secondary | ICD-10-CM | POA: Diagnosis not present

## 2017-03-29 DIAGNOSIS — I110 Hypertensive heart disease with heart failure: Secondary | ICD-10-CM | POA: Diagnosis not present

## 2017-03-29 LAB — BASIC METABOLIC PANEL
ANION GAP: 7 (ref 5–15)
BUN: 16 mg/dL (ref 6–20)
CHLORIDE: 106 mmol/L (ref 101–111)
CO2: 27 mmol/L (ref 22–32)
CREATININE: 1.1 mg/dL (ref 0.61–1.24)
Calcium: 8.9 mg/dL (ref 8.9–10.3)
GFR calc non Af Amer: >60 mL/min (ref >60)
Glucose, Bld: 105 mg/dL — ABNORMAL HIGH (ref 65–99)
POTASSIUM: 3.9 mmol/L (ref 3.5–5.1)
SODIUM: 140 mmol/L (ref 135–145)

## 2017-03-29 LAB — MAGNESIUM: MAGNESIUM: 2 mg/dL (ref 1.7–2.4)

## 2017-03-29 MED ORDER — APIXABAN 5 MG PO TABS
5.0000 mg | ORAL_TABLET | Freq: Two times a day (BID) | ORAL | Status: DC
  Administered 2017-03-29 – 2017-04-01 (×6): 5 mg via ORAL
  Filled 2017-03-29 (×7): qty 1

## 2017-03-29 MED ORDER — SODIUM CHLORIDE 0.9% FLUSH
3.0000 mL | Freq: Two times a day (BID) | INTRAVENOUS | Status: DC
  Administered 2017-03-29 – 2017-03-31 (×4): 3 mL via INTRAVENOUS

## 2017-03-29 MED ORDER — GABAPENTIN 300 MG PO CAPS
300.0000 mg | ORAL_CAPSULE | Freq: Three times a day (TID) | ORAL | Status: DC
  Administered 2017-03-29 – 2017-04-01 (×9): 300 mg via ORAL
  Filled 2017-03-29 (×9): qty 1

## 2017-03-29 MED ORDER — POTASSIUM CHLORIDE CRYS ER 20 MEQ PO TBCR
40.0000 meq | EXTENDED_RELEASE_TABLET | Freq: Once | ORAL | Status: DC
  Administered 2017-03-29: 40 meq via ORAL

## 2017-03-29 MED ORDER — POTASSIUM CHLORIDE CRYS ER 10 MEQ PO TBCR: 40.0000 meq | EXTENDED_RELEASE_TABLET | Freq: Once | ORAL | Status: DC

## 2017-03-29 MED ORDER — ACETAMINOPHEN 500 MG PO TABS
500.0000 mg | ORAL_TABLET | Freq: Two times a day (BID) | ORAL | Status: DC
  Administered 2017-03-29 – 2017-04-01 (×7): 500 mg via ORAL
  Filled 2017-03-29 (×7): qty 1

## 2017-03-29 MED ORDER — METFORMIN HCL 500 MG PO TABS
500.0000 mg | ORAL_TABLET | Freq: Every day | ORAL | Status: DC
Start: 2017-03-30 — End: 2017-04-01
  Administered 2017-03-30 – 2017-04-01 (×3): 500 mg via ORAL
  Filled 2017-03-29 (×4): qty 1

## 2017-03-29 MED ORDER — MOMETASONE FURO-FORMOTEROL FUM 100-5 MCG/ACT IN AERO: 2.0000 | INHALATION_SPRAY | Freq: Two times a day (BID) | RESPIRATORY_TRACT | Status: DC | PRN

## 2017-03-29 MED ORDER — DOFETILIDE 500 MCG PO CAPS
500.0000 ug | ORAL_CAPSULE | Freq: Two times a day (BID) | ORAL | Status: DC
  Administered 2017-03-29 – 2017-04-01 (×6): 500 ug via ORAL
  Filled 2017-03-29 (×6): qty 1

## 2017-03-29 MED ORDER — SODIUM CHLORIDE 0.9 % IV SOLN
250.0000 mL | INTRAVENOUS | Status: DC | PRN
  Administered 2017-03-31: 09:00:00 via INTRAVENOUS

## 2017-03-29 MED ORDER — SODIUM CHLORIDE 0.9% FLUSH: 3.0000 mL | INTRAVENOUS | Status: DC | PRN

## 2017-03-29 MED ORDER — LOSARTAN POTASSIUM 50 MG PO TABS
100.0000 mg | ORAL_TABLET | Freq: Two times a day (BID) | ORAL | Status: DC
  Administered 2017-03-29 – 2017-04-01 (×6): 100 mg via ORAL
  Filled 2017-03-29 (×9): qty 2

## 2017-03-29 MED ORDER — HYDRALAZINE HCL 50 MG PO TABS
50.0000 mg | ORAL_TABLET | Freq: Three times a day (TID) | ORAL | Status: DC
  Administered 2017-03-29 – 2017-04-01 (×9): 50 mg via ORAL
  Filled 2017-03-29 (×9): qty 1

## 2017-03-29 MED ORDER — ALLOPURINOL 300 MG PO TABS
300.0000 mg | ORAL_TABLET | Freq: Every day | ORAL | Status: DC
  Administered 2017-03-29 – 2017-03-31 (×3): 300 mg via ORAL
  Filled 2017-03-29 (×3): qty 1

## 2017-03-29 MED ORDER — ADULT MULTIVITAMIN W/MINERALS CH
1.0000 | ORAL_TABLET | Freq: Every day | ORAL | Status: DC
  Administered 2017-03-29 – 2017-04-01 (×4): 1 via ORAL
  Filled 2017-03-29 (×6): qty 1

## 2017-03-29 MED ORDER — METOPROLOL SUCCINATE ER 100 MG PO TB24
100.0000 mg | ORAL_TABLET | Freq: Every day | ORAL | Status: DC
  Administered 2017-03-30 – 2017-04-01 (×3): 100 mg via ORAL
  Filled 2017-03-29 (×3): qty 1

## 2017-03-29 MED ORDER — FUROSEMIDE 20 MG PO TABS
20.0000 mg | ORAL_TABLET | Freq: Every day | ORAL | Status: DC
  Administered 2017-03-29 – 2017-03-30 (×2): 20 mg via ORAL
  Filled 2017-03-29 (×2): qty 1

## 2017-03-29 NOTE — Progress Notes (Addendum)
Pharmacy Review for Dofetilide (Tikosyn) Initiation  Admit Complaint: 66 y.o. male admitted with atrial fibrillation to be initiated on dofetilide.   Assessment: Admitted for persistent Afib for Tikosyn initiation. Potassium has been replaced. Team aware of EKG showing Qtc 442, while in atrial fibrillation and heart rate of 93. No missed doses of anticoagulation.    Patient Exclusion Criteria: If any screening criteria checked as "Yes", then  patient  should NOT receive dofetilide until criteria item is corrected. If "Yes" please indicate correction plan.  YES           NO Patient  Exclusion Criteria Correction Plan  X ? Baseline QTc interval is greater than or equal to 440 msec. IF above YES box checked dofetilide contraindicated unless patient has ICD; then may proceed if QTc 500-550 msec or with known ventricular conduction abnormalities may proceed with QTc 550-600 msec.  Qtc 442, in AF with HR 93 >> team aware. Follow-up EKGs ordered.  ? X Magnesium level is less than 1.8 mEq/l : Last magnesium:          Lab Results  Component Value Date   MG 2.0 03/29/2017           X ? Potassium level is less than 4 mEq/l : Last potassium:        Lab Results   Component Value Date   K 3.9 03/29/2017        Received 40 mEq of K after lab drawn. No diuretics on board. BMP and Mg levels ordered to recheck tomorrow.  ? X Patient is known or suspected to have a digoxin level greater than 2 ng/ml: No results found for: DIGOXIN    ? X Creatinine clearance less than 20 ml/min (calculated using Cockcroft-Gault, actual body weight and serum creatinine): Estimated Creatinine Clearance: 121.5 mL/min (by C-G formula based on SCr of 1.1 mg/dL).    ? X Patient has received drugs known to prolong the QT intervals within the last 48 hours (phenothiazines, tricyclics or tetracyclic antidepressants, erythromycin, H-1 antihistamines, cisapride, fluoroquinolones, azithromycin). Drugs not listed  above may have an, as yet, undetected potential to prolong the QT interval, updated information on QT prolonging agents is available at this website:QT prolonging agents   ? X Patient received a dose of hydrochlorothiazide (Oretic) alone or in any combination including triamterene (Dyazide, Maxzide) in the last 48 hours. Has been off for 3 days per notes  ? X Patient received a medication known to increase dofetilide plasma concentrations prior to initial dofetilide dose:   Trimethoprim (Primsol, Proloprim) in the last 36 hours  Verapamil (Calan, Verelan) in the last 36 hours or a sustained release dose in the last 72 hours  Megestrol (Megace) in the last 5 days   Cimetidine (Tagamet) in the last 6 hours  Ketoconazole (Nizoral) in the last 24 hours  Itraconazole (Sporanox) in the last 48 hours   Prochlorperazine (Compazine) in the last 36 hours    ? X Patient is known to have a history of torsades de pointes; congenital or acquired long QT syndromes.   ? X Patient has received a Class 1 antiarrhythmic with less than 2 half-lives since last dose. (Disopyramide, Quinidine, Procainamide, Lidocaine, Mexiletine, Flecainide, Propafenone) Has been off rythmol for days per notes  ? X Patient has received amiodarone therapy in the past 3 months or amiodarone level is greater than 0.3 ng/ml.    Patient has been appropriately anticoagulated with apixaban - has not missed any doses.  Goal  of Therapy: Follow renal function, electrolytes, potential drug interactions, and dose adjustment. Provide education and 1 week supply at discharge.  Plan:  X             Physician selected initial dose within range recommended for patients level of renal function - will monitor for response.  ?             Physician selected initial dose outside of range recommended for patients level of renal function - will discuss if the dose should be altered at this time.   Select One Calculated CrCl  Dose q12h  X  > 60 ml/min 500 mcg  ? 40-60 ml/min 250 mcg  ? 20-40 ml/min 125 mcg   2. Follow up QTc after the first 5 doses, renal function, electrolytes (K & Mg) daily x 3     days, dose adjustment, success of initiation and facilitate 1 week discharge supply as     clinically indicated.  3. Initiate Tikosyn education video (Call [phone] and ask for video # 116).  4. Provide prescription to main pharmacy at time of discharge to supply 7 days of dofetilide.   Girard Cooter, PharmD Clinical Pharmacist  Pager: [phone] Phone: [phone]  2:54 PM 03/29/2017

## 2017-03-29 NOTE — H&P (Signed)
Cardiology Admission History and Physical:   Patient ID: Luke Morgan; MRN: [id number]; DOB: 05/31/1950   Admission date: 03/29/2017  Primary Care Provider: Barbette Reichmann, MD Primary Cardiologist/Primary Electrophysiologist:  Dr. Graciela Husbands  Chief Complaint:  Here for Tikosyn initiation  Patient Profile:   Luke Morgan is a 66 y.o. male has a history of persistent AFib,  HTN, OSA, mobid obesity, asthma rarely uses his Advair he says is a PRN use), and CBP with spinal stenosis.  History of Present Illness:   Luke Morgan follows out patient with Dr. Graciela Husbands, has persistent AFib, had DCCV on Rythmol in August that only last a few weeks, was decided to pursu alternative AAD and planned for Tikosyn.  The patient's medicines were reviewed by the clinic [facility], his Rythmol was stopped 02/03/17 after Dr. Odessa Fleming visit and his HCTZ stopped 3 days agoin preparation for today/Tikosyn.  The patient reports that AF was newly found only a couple months ago, did not have any particular symptoms other then a vague feeling like he did not have the same stamina or energy as usual, no palpitations or SOB, no particular SOB.    He is feeling at his baseline today, no recent illness or fever of any kind.  We reviewed Tikosyn risks and and need for hospitalization for initiation, he was informed previously and would like to proceed.  He confirms no missed dose of his Eliquis in the last 4 weeks, even longer, confirms started plain losartan on Saturday.  He did take the of K+ after his labs today.   Past Medical History:  Diagnosis Date  . Asthma   . Atrial fibrillation (HCC)   . Hypertension   . Kidney stones   . Obesity   . Osteoarthritis   . Sleep apnea     No past surgical history on file.   Medications Prior to Admission: Prior to Admission medications   Medication Sig Start Date End Date Taking? Authorizing Provider  acetaminophen (TYLENOL) 500 MG tablet Take 500 mg by mouth 2  (two) times daily.     [provider]  allopurinol (ZYLOPRIM) 300 MG tablet Take 300 mg by mouth daily.    [provider]  apixaban (ELIQUIS) 5 MG TABS tablet Take 5 mg by mouth 2 (two) times daily.    [provider]  Fluticasone-Salmeterol (ADVAIR) 100-50 MCG/DOSE AEPB Inhale 1 puff into the lungs 2 (two) times daily as needed.     [provider]  furosemide (LASIX) 20 MG tablet Take 1 tablet (20 mg) by mouth once daily, may take 1 extra tablet (20 mg) once daily as needed for increased shortness of breath/ swelling 01/20/17   Duke Salvia, MD  gabapentin (NEURONTIN) 300 MG capsule Take 300 mg by mouth 3 (three) times daily.    [provider]  hydrALAZINE (APRESOLINE) 50 MG tablet Take 50 mg by mouth 3 (three) times daily.    [provider]  losartan (COZAAR) 100 MG tablet Take 1 tablet (100 mg total) by mouth 2 (two) times daily. 03/09/17   Duke Salvia, MD  metFORMIN (GLUCOPHAGE) 500 MG tablet Take by mouth daily with breakfast.    [provider]  metoprolol succinate (TOPROL-XL) 100 MG 24 hr tablet Take 100 mg by mouth daily. Take with or immediately following a meal.    [provider]  Multiple Vitamins-Minerals (MULTIVITAMIN WITH MINERALS) tablet Take 1 tablet by mouth daily.    [provider]  Allergies:    Allergies  Allergen Reactions  . Penicillins Other (See Comments)    Has patient had a PCN reaction causing immediate rash, facial/tongue/throat swelling, SOB or lightheadedness with hypotension: Yes Has patient had a PCN reaction causing severe rash involving mucus membranes or skin necrosis: No Has patient had a PCN reaction that required hospitalization No Has patient had a PCN reaction occurring within the last 10 years: No If all of the above answers are "NO", then may proceed with Cephalosporin use.    Social History:   Social History   Socioeconomic History  . Marital status:  Married    Spouse name: Not on file  . Number of children: Not on file  . Years of education: Not on file  . Highest education level: Not on file  Social Needs  . Financial resource strain: Not on file  . Food insecurity - worry: Not on file  . Food insecurity - inability: Not on file  . Transportation needs - medical: Not on file  . Transportation needs - non-medical: Not on file  Occupational History  . Not on file  Tobacco Use  . Smoking status: Never Smoker  . Smokeless tobacco: Never Used  Substance and Sexual Activity  . Alcohol use: Yes    Comment: meals  . Drug use: No  . Sexual activity: Not on file  Other Topics Concern  . Not on file  Social History Narrative  . Not on file    Family History:   The patient's family history includes Cancer in his mother and paternal grandfather; Heart disease in his father; Parkinson's disease in his mother.    ROS:  Please see the history of present illness.  All other ROS reviewed and negative.     Physical Exam/Data:   Vitals:   03/29/17 1356  BP: (!) 142/95  Pulse: 87  Resp: 15  Temp: 98.7 F (37.1 C)  TempSrc: Oral  SpO2: 94%  Weight: (!) 403 lb (182.8 kg)  Height: 6\' 5"  (1.956 m)   No intake or output data in the 24 hours ending 03/29/17 1455 Filed Weights   03/29/17 1356  Weight: (!) 403 lb (182.8 kg)   Body mass index is 47.79 kg/m.  General:  Well nourished, well developed, in no acute distress HEENT: normal Lymph: no adenopathy Neck: neck is obese, difficult to assess JVD Endocrine:  No thryomegaly Vascular: No carotid bruits Cardiac:  iRRR; no murmurs, gallops or rubs Lungs:  CTA b/l, no wheezing, rhonchi or rales  Abd: soft, nontender, obese Ext: trace edema Musculoskeletal:  No deformities Skin: warm and dry  Neuro:  No gross focal abnormalities noted Psych:  Normal affect    EKG:  AFib 93bpm, QTc 442ms, was personally reviewed  Telemetry: AFib 70's personally reviewed  Relevant CV  Studies:  Dr. Odessa FlemingKlein's note references: "Echocardiogram obtained from spring summer 2018 demonstrated an ejection fraction of 43% Wall thickness normal"    Laboratory Data:  Chemistry Recent Labs  Lab 03/29/17 0926  NA 140  K 3.9  CL 106  CO2 27  GLUCOSE 105*  BUN 16  CREATININE 1.10  CALCIUM 8.9  GFRNONAA >60  GFRAA >60  ANIONGAP 7    No results for input(s): PROT, ALBUMIN, AST, ALT, ALKPHOS, BILITOT in the last 168 hours. HematologyNo results for input(s): WBC, RBC, HGB, HCT, MCV, MCH, MCHC, RDW, PLT in the last 168 hours. Cardiac EnzymesNo results for input(s): TROPONINI in the last 168 hours. No results for  input(s): TROPIPOC in the last 168 hours.  BNPNo results for input(s): BNP, PROBNP in the last 168 hours.  DDimer No results for input(s): DDIMER in the last 168 hours.  Radiology/Studies:  No results found.  Assessment and Plan:   1. Persistent AFib, here for Tikosyn initiation     K+ 3.9 (took prior to arrival to replace), I have reviewed with Dr. Johney Frame, OK to start without recheck today     Mag 2.0     Creat 1.10 (cal creat cl = 175)     QTc  Will plan DCCV Thursday if not in SR, patient is aware, has had previously and agreeable to proceed if needed  2. HTN     Continue home meds  3. OSA on CPAP     He brought his home machine   4. CM     No exam findings of overt fluid OL     No symptoms to suggest OL     Continue home lasix, ARB/BB      Signed, Sheilah Pigeon, PA-C  03/29/2017 2:55 PM    I have seen, examined the patient, and reviewed the above assessment and plan.  Changes to above are made where necessary.  On exam, morbidly obese, iRRR.  Will admit for initiation of tikosyn.  Pt reports compliance with eliquis without interruption.  Labs ok to start tikosyn.  EKG is reviewed and QTc is 440, ok to start tikosyn.  Co Sign: Hillis Range, MD 03/29/2017 3:22 PM

## 2017-03-29 NOTE — Progress Notes (Signed)
Primary Care Physician: Barbette ReichmannHande, Vishwanath, MD Referring Physician: Dr. Adalberto IllKlein   Luke Morgan is a 66 y.o. male with a h/o persistent afib that previously failed cardioversion, now in the afib clinic for Tikosyn admit per Dr.Klein. His meds were reviewed by PharmD, and per recommendations, he is now off HCTZ and Rythmol for many days. No missed eliquis doses. No use of benadryl   Today, he denies symptoms of palpitations, chest pain, shortness of breath, orthopnea, PND, lower extremity edema, dizziness, presyncope, syncope, or neurologic sequela. The patient is tolerating medications without difficulties and is otherwise without complaint today.   Past Medical History:  Diagnosis Date  . Asthma   . Atrial fibrillation (HCC)   . Hypertension   . Kidney stones   . Obesity   . Osteoarthritis   . Sleep apnea    No past surgical history on file.  Current Outpatient Medications  Medication Sig Dispense Refill  . acetaminophen (TYLENOL) 500 MG tablet Take 500 mg by mouth 2 (two) times daily.     Marland Kitchen. allopurinol (ZYLOPRIM) 300 MG tablet Take 300 mg by mouth daily.    Marland Kitchen. apixaban (ELIQUIS) 5 MG TABS tablet Take 5 mg by mouth 2 (two) times daily.    . Fluticasone-Salmeterol (ADVAIR) 100-50 MCG/DOSE AEPB Inhale 1 puff into the lungs 2 (two) times daily as needed.     . furosemide (LASIX) 20 MG tablet Take 1 tablet (20 mg) by mouth once daily, may take 1 extra tablet (20 mg) once daily as needed for increased shortness of breath/ swelling 45 tablet 6  . gabapentin (NEURONTIN) 300 MG capsule Take 300 mg by mouth 3 (three) times daily.    . hydrALAZINE (APRESOLINE) 50 MG tablet Take 50 mg by mouth 3 (three) times daily.    Marland Kitchen. losartan (COZAAR) 100 MG tablet Take 1 tablet (100 mg total) by mouth 2 (two) times daily. 60 tablet 3  . metFORMIN (GLUCOPHAGE) 500 MG tablet Take by mouth daily with breakfast.    . metoprolol succinate (TOPROL-XL) 100 MG 24 hr tablet Take 100 mg by mouth daily. Take  with or immediately following a meal.    . Multiple Vitamins-Minerals (MULTIVITAMIN WITH MINERALS) tablet Take 1 tablet by mouth daily.     No current facility-administered medications for this encounter.     Allergies  Allergen Reactions  . Penicillins     Unknown reaction Has patient had a PCN reaction causing immediate rash, facial/tongue/throat swelling, SOB or lightheadedness with hypotension: Yes Has patient had a PCN reaction causing severe rash involving mucus membranes or skin necrosis: No Has patient had a PCN reaction that required hospitalization No Has patient had a PCN reaction occurring within the last 10 years: No If all of the above answers are "NO", then may proceed with Cephalosporin use.    Social History   Socioeconomic History  . Marital status: Married    Spouse name: Not on file  . Number of children: Not on file  . Years of education: Not on file  . Highest education level: Not on file  Social Needs  . Financial resource strain: Not on file  . Food insecurity - worry: Not on file  . Food insecurity - inability: Not on file  . Transportation needs - medical: Not on file  . Transportation needs - non-medical: Not on file  Occupational History  . Not on file  Tobacco Use  . Smoking status: Never Smoker  . Smokeless tobacco: Never Used  Substance and Sexual Activity  . Alcohol use: Yes    Comment: meals  . Drug use: No  . Sexual activity: Not on file  Other Topics Concern  . Not on file  Social History Narrative  . Not on file    No family history on file.  ROS- All systems are reviewed and negative except as per the HPI above  Physical Exam: Vitals:   03/29/17 0938  BP: 126/74  Pulse: 93  Weight: (!) 403 lb 6.4 oz (183 kg)  Height: 6\' 5"  (1.956 m)   Wt Readings from Last 3 Encounters:  03/29/17 (!) 403 lb 6.4 oz (183 kg)  02/03/17 (!) 397 lb 4 oz (180.2 kg)  12/30/16 (!) 392 lb 8 oz (178 kg)    Labs: Lab Results  Component Value  Date   NA 140 12/16/2016   K 4.0 12/16/2016   CL 100 12/16/2016   CO2 21 12/16/2016   GLUCOSE 87 12/16/2016   BUN 17 12/16/2016   CREATININE 0.88 12/16/2016   CALCIUM 9.1 12/16/2016   No results found for: INR No results found for: CHOL, HDL, LDLCALC, TRIG   GEN- The patient is well appearing, alert and oriented x 3 today.   Head- normocephalic, atraumatic Eyes-  Sclera clear, conjunctiva pink Ears- hearing intact Oropharynx- clear Neck- supple, no JVP Lymph- no cervical lymphadenopathy Lungs- Clear to ausculation bilaterally, normal work of breathing Heart- irregular rate and rhythm, no murmurs, rubs or gallops, PMI not laterally displaced GI- soft, NT, ND, + BS Extremities- no clubbing, cyanosis, or edema MS- no significant deformity or atrophy Skin- no rash or lesion Psych- euthymic mood, full affect Neuro- strength and sensation are intact  EKG- Afib at 93 bpm, qrs int 84 ms, qtc 442 ms Epic records  Reviewed   Assessment and Plan: 1. Persistent afib General review of Tikosyn guidelines In today for Tikosyn admit No qtc prolonging drugs on board No missed doses of eliquis No benadryl use Bmet/mag today shows Kt at 3.9, mag good at 2.0.  Will prescribe 40 meq K+ to  take prior to admission. Bed placement pending early to mid afternoon   Lupita Leash [facility]. [facility] [address] [place], [place] [address] [phone]

## 2017-03-30 ENCOUNTER — Other Ambulatory Visit: Payer: Self-pay

## 2017-03-30 ENCOUNTER — Encounter (HOSPITAL_COMMUNITY): Payer: Self-pay

## 2017-03-30 LAB — BASIC METABOLIC PANEL
ANION GAP: 5 (ref 5–15)
BUN: 14 mg/dL (ref 6–20)
CHLORIDE: 105 mmol/L (ref 101–111)
CO2: 29 mmol/L (ref 22–32)
Calcium: 8.6 mg/dL — ABNORMAL LOW (ref 8.9–10.3)
Creatinine, Ser: 0.96 mg/dL (ref 0.61–1.24)
GFR calc non Af Amer: >60 mL/min (ref >60)
Glucose, Bld: 112 mg/dL — ABNORMAL HIGH (ref 65–99)
Potassium: 3.8 mmol/L (ref 3.5–5.1)
Sodium: 139 mmol/L (ref 135–145)

## 2017-03-30 LAB — MAGNESIUM: MAGNESIUM: 2 mg/dL (ref 1.7–2.4)

## 2017-03-30 LAB — POTASSIUM: POTASSIUM: 4.4 mmol/L (ref 3.5–5.1)

## 2017-03-30 MED ORDER — POTASSIUM CHLORIDE CRYS ER 20 MEQ PO TBCR
30.0000 meq | EXTENDED_RELEASE_TABLET | ORAL | Status: AC
  Administered 2017-03-30 (×2): 30 meq via ORAL
  Filled 2017-03-30 (×2): qty 1

## 2017-03-30 NOTE — Progress Notes (Signed)
Progress Note  Patient Name: Luke SayresWilliam R Grieves III Date of Encounter: 03/30/2017  Primary Cardiologist: Dr. Graciela HusbandsKlein  Subjective   No complaints, feels well.  Inpatient Medications    Scheduled Meds: . acetaminophen  500 mg Oral BID  . allopurinol  300 mg Oral Daily  . apixaban  5 mg Oral BID  . dofetilide  500 mcg Oral BID  . furosemide  20 mg Oral Daily  . gabapentin  300 mg Oral TID  . hydrALAZINE  50 mg Oral TID  . losartan  100 mg Oral BID  . metFORMIN  500 mg Oral Q breakfast  . metoprolol succinate  100 mg Oral Daily  . multivitamin with minerals  1 tablet Oral Daily  . potassium chloride  30 mEq Oral Q4H  . sodium chloride flush  3 mL Intravenous Q12H   Continuous Infusions: . sodium chloride     PRN Meds: sodium chloride, sodium chloride flush   Vital Signs    Vitals:   03/29/17 1800 03/29/17 2154 03/29/17 2219 03/30/17 0554  BP:  (!) 141/82 (!) 141/82 (!) 141/87  Pulse:  75  75  Resp:      Temp:  98.5 F (36.9 C)  98 F (36.7 C)  TempSrc:  Oral  Oral  SpO2:  97%  98%  Weight: (!) 402 lb (182.3 kg)   (!) 398 lb 4.8 oz (180.7 kg)  Height: 6\' 5"  (1.956 m)       Intake/Output Summary (Last 24 hours) at 03/30/2017 1022 Last data filed at 03/29/2017 2006 Gross per 24 hour  Intake 560 ml  Output -  Net 560 ml   Filed Weights   03/29/17 1356 03/29/17 1800 03/30/17 0554  Weight: (!) 403 lb (182.8 kg) (!) 402 lb (182.3 kg) (!) 398 lb 4.8 oz (180.7 kg)    Telemetry    AFib 80's - Personally Reviewed  ECG     AFlutter, 85bpm, QTc 473ms- Personally Reviewed  Physical Exam   GEN: No acute distress.   Neck: difficult to assess 2/2 body habitus Cardiac: iRRR, no murmurs, rubs, or gallops.  Respiratory: CTA b/l. GI: Soft, nontender, obese  MS: trace edema; No deformity. Neuro:  Nonfocal  Psych: Normal affect   Labs    Chemistry Recent Labs  Lab 03/29/17 0926 03/30/17 0603  NA 140 139  K 3.9 3.8  CL 106 105  CO2 27 29  GLUCOSE 105* 112*   BUN 16 14  CREATININE 1.10 0.96  CALCIUM 8.9 8.6*  GFRNONAA >60 >60  GFRAA >60 >60  ANIONGAP 7 5     HematologyNo results for input(s): WBC, RBC, HGB, HCT, MCV, MCH, MCHC, RDW, PLT in the last 168 hours.  Cardiac EnzymesNo results for input(s): TROPONINI in the last 168 hours. No results for input(s): TROPIPOC in the last 168 hours.   BNPNo results for input(s): BNP, PROBNP in the last 168 hours.   DDimer No results for input(s): DDIMER in the last 168 hours.   Radiology    No results found.  Cardiac Studies   Dr. Odessa FlemingKlein's note references: "Echocardiogram obtainedfrom spring summer 2018demonstrated an ejection fraction of 43% Wall thickness normal"    Patient Profile     66 y.o. male with history of persistent AFib,  HTN, OSA, mobid obesity, asthma rarely uses his Advair he says is a PRN use), and CBP with spinal stenosis, admitted for Tikosyn initiation  Assessment & Plan    1. Persistent AFib, here for Tikosyn  initiation     K+ 3.8, replacement ordered, will recheck this afternoon     Mag 2.0     Creat stable at 0.96     QTc is OK  If not in SR, planned for  DCCV tomorrow, reached out to endo yesterday, as of yet no space on the schedule, they will try to accomodate Will hold NPO after midnight, patient is aware, has had previously and agreeable to proceed if needed  2. HTN     Continue home meds  3. OSA on CPAP     He brought his home machine   4. CM     No exam findings of overt fluid OL     No symptoms to suggest OL     Continue home lasix, ARB/BB    For questions or updates, please contact [facility] Please consult [email or web address] for contact info under Cardiology/STEMI.      Signed, Sheilah Pigeon, PA-C  03/30/2017, 10:22 AM     I have seen, examined the patient, and reviewed the above assessment and plan.  Changes to above are made where necessary.  On exam, iRRR. Will continue tikosyn load Make NPO after midnight for possible  cardioversion tomorrow Ultimately, will need to lose weight in order to maintain sinus rhythm Outpatient bariatric referral should be considered.   Co Sign: Hillis Range, MD 03/30/2017 4:34 PM

## 2017-03-30 NOTE — Plan of Care (Signed)
Patient has been educated on 1st time dose of Tykosin.  EKG was done and Qtc was at 473.  The patient is sleeping at this time and is using CPAP at night.

## 2017-03-30 NOTE — Care Management Note (Signed)
Case Management Note  Patient Details  Name: Luke Morgan MRN: [id number] Date of Birth: 03-26-1951  Subjective/Objective:  Pt presented for Tikosyn Load: Benefits Check completed and pt is aware of cost. PTA from home and plan will be to return home. Pt has DME CPAP. No Home needs identified by CM at this time.                  Action/Plan: CM will assist with the Rx-no refills for 7 day supply Tikosyn to be filled via the [facility]. Pt will need additional Rx with refills. Pt uses [facility] in [place] and medication can be ordered. No further needs at this time.     [facility] @ [employer] # [phone]   1. TIKOSYN 125 MCG , 250 MCG AND 500 MCG BID  COVER- - NOT [place] # [phone]    2. DOFETILIDE 125 MCG BID  COVER- YES  CO-PAY- $ 154.15  TIER- 4 DRUG  PRIOR APPROVAL- NO   3. DOFETILIDE 250 MCG BID  COVER- YES  CO-PAY- $ 139.10  TIER- 4 DRUG  PRIOR APPROVAL- NO   4. DOFETILIDE 500 MCG BID  COVER-YES  CO-PAY- $ 159.26  TIER- 4 DRUG  PRIOR APPROVAL- NO   DEDUCTIBLE: NOT MET   PREFERRED PHARMACY - [facility]   Expected Discharge Date:                  Expected Discharge Plan:  Home/Self Care  In-House Referral:  NA  Discharge planning Services  CM Consult, Medication Assistance  Post Acute Care Choice:  NA Choice offered to:  NA  DME Arranged:  N/A DME Agency:  NA  HH Arranged:  NA HH Agency:  NA  Status of Service:  Completed, signed off  If discussed at Long Length of Stay Meetings, dates discussed:    Additional Comments:  Bethena Roys, RN 03/30/2017, 10:45 AM

## 2017-03-31 ENCOUNTER — Inpatient Hospital Stay (HOSPITAL_COMMUNITY): Payer: Medicare Other | Admitting: Anesthesiology

## 2017-03-31 ENCOUNTER — Encounter (HOSPITAL_COMMUNITY): Payer: Self-pay | Admitting: Anesthesiology

## 2017-03-31 ENCOUNTER — Encounter (HOSPITAL_COMMUNITY)
Admission: RE | Disposition: A | Discharge status: Home / Self Care | Payer: Self-pay | Source: Ambulatory Visit | Attending: Internal Medicine

## 2017-03-31 DIAGNOSIS — I110 Hypertensive heart disease with heart failure: Secondary | ICD-10-CM

## 2017-03-31 DIAGNOSIS — Z5181 Encounter for therapeutic drug level monitoring: Secondary | ICD-10-CM

## 2017-03-31 DIAGNOSIS — I4891 Unspecified atrial fibrillation: Secondary | ICD-10-CM

## 2017-03-31 DIAGNOSIS — Z79899 Other long term (current) drug therapy: Secondary | ICD-10-CM

## 2017-03-31 DIAGNOSIS — I428 Other cardiomyopathies: Secondary | ICD-10-CM

## 2017-03-31 DIAGNOSIS — I5030 Unspecified diastolic (congestive) heart failure: Secondary | ICD-10-CM

## 2017-03-31 HISTORY — PX: CARDIOVERSION: SHX1299

## 2017-03-31 LAB — BASIC METABOLIC PANEL
ANION GAP: 5 (ref 5–15)
BUN: 12 mg/dL (ref 6–20)
CALCIUM: 8.6 mg/dL — AB (ref 8.9–10.3)
CO2: 29 mmol/L (ref 22–32)
Chloride: 105 mmol/L (ref 101–111)
Creatinine, Ser: 0.97 mg/dL (ref 0.61–1.24)
Glucose, Bld: 99 mg/dL (ref 65–99)
POTASSIUM: 4.2 mmol/L (ref 3.5–5.1)
SODIUM: 139 mmol/L (ref 135–145)

## 2017-03-31 LAB — CBC
HEMATOCRIT: 43.7 % (ref 39.0–52.0)
HEMOGLOBIN: 14.2 g/dL (ref 13.0–17.0)
MCH: 29.2 pg (ref 26.0–34.0)
MCHC: 32.5 g/dL (ref 30.0–36.0)
MCV: 89.7 fL (ref 78.0–100.0)
Platelets: 234 10*3/uL (ref 150–400)
RBC: 4.87 MIL/uL (ref 4.22–5.81)
RDW: 15.2 % (ref 11.5–15.5)
WBC: 7.7 10*3/uL (ref 4.0–10.5)

## 2017-03-31 LAB — MAGNESIUM: MAGNESIUM: 2.1 mg/dL (ref 1.7–2.4)

## 2017-03-31 SURGERY — CARDIOVERSION
Anesthesia: General

## 2017-03-31 MED ORDER — HYDROCORTISONE 1 % EX CREA
1.0000 "application " | TOPICAL_CREAM | Freq: Three times a day (TID) | CUTANEOUS | Status: DC | PRN
  Filled 2017-03-31: qty 28

## 2017-03-31 MED ORDER — SODIUM CHLORIDE 0.9% FLUSH: 3.0000 mL | INTRAVENOUS | Status: DC | PRN

## 2017-03-31 MED ORDER — POTASSIUM CHLORIDE CRYS ER 20 MEQ PO TBCR
30.0000 meq | EXTENDED_RELEASE_TABLET | Freq: Once | ORAL | Status: AC
  Administered 2017-03-31: 20:00:00 30 meq via ORAL
  Filled 2017-03-31: qty 1

## 2017-03-31 MED ORDER — PROPOFOL 10 MG/ML IV BOLUS
INTRAVENOUS | Status: DC | PRN
  Administered 2017-03-31: 90 mg via INTRAVENOUS

## 2017-03-31 MED ORDER — SODIUM CHLORIDE 0.9 % IV SOLN: INTRAVENOUS | Status: DC

## 2017-03-31 MED ORDER — SODIUM CHLORIDE 0.9% FLUSH
3.0000 mL | Freq: Two times a day (BID) | INTRAVENOUS | Status: DC
  Administered 2017-03-31 – 2017-04-01 (×2): 3 mL via INTRAVENOUS

## 2017-03-31 MED ORDER — LIDOCAINE HCL (CARDIAC) 20 MG/ML IV SOLN
INTRAVENOUS | Status: DC | PRN
  Administered 2017-03-31: 100 mg via INTRAVENOUS

## 2017-03-31 MED ORDER — SODIUM CHLORIDE 0.9 % IV SOLN: 250.0000 mL | INTRAVENOUS | Status: DC

## 2017-03-31 MED ORDER — FUROSEMIDE 10 MG/ML IJ SOLN
80.0000 mg | Freq: Two times a day (BID) | INTRAMUSCULAR | Status: AC
  Administered 2017-03-31 (×2): 80 mg via INTRAVENOUS
  Filled 2017-03-31 (×2): qty 8

## 2017-03-31 NOTE — H&P (View-Only) (Signed)
 Progress Note  Patient Name: Luke Morgan Date of Encounter: 03/31/2017  Primary Cardiologist: Dr. Raeleigh Guinn  Subjective   .without chest pain or shortness of breath   Inpatient Medications    Scheduled Meds: . acetaminophen  500 mg Oral BID  . allopurinol  300 mg Oral Daily  . apixaban  5 mg Oral BID  . dofetilide  500 mcg Oral BID  . furosemide  20 mg Oral Daily  . gabapentin  300 mg Oral TID  . hydrALAZINE  50 mg Oral TID  . losartan  100 mg Oral BID  . metFORMIN  500 mg Oral Q breakfast  . metoprolol succinate  100 mg Oral Daily  . multivitamin with minerals  1 tablet Oral Daily  . sodium chloride flush  3 mL Intravenous Q12H   Continuous Infusions: . sodium chloride     PRN Meds: sodium chloride, sodium chloride flush   Vital Signs    Vitals:   03/30/17 0554 03/30/17 1415 03/30/17 2144 03/31/17 0520  BP: (!) 141/87 132/82 (!) 138/92 137/86  Pulse: 75 86 76 91  Resp:  18    Temp: 98 F (36.7 C) 98.2 F (36.8 C) 98.2 F (36.8 C) 97.7 F (36.5 C)  TempSrc: Oral Oral Oral Oral  SpO2: 98% 97% 97% 96%  Weight: (!) 398 lb 4.8 oz (180.7 kg)     Height:        Intake/Output Summary (Last 24 hours) at 03/31/2017 0724 Last data filed at 03/30/2017 2230 Gross per 24 hour  Intake 840 ml  Output -  Net 840 ml   Filed Weights   03/29/17 1356 03/29/17 1800 03/30/17 0554  Weight: (!) 403 lb (182.8 kg) (!) 402 lb (182.3 kg) (!) 398 lb 4.8 oz (180.7 kg)    Telemetry    AFib Personally reviewed    ECG     Personally reviewed  22:54   QT about 400 mse andQTc about 480  Physical Exam   Well developed and nourished in no acute distress HENT normal Neck supple with JVP-flat Clear Irregularly irregular rate and rhythm, no murmurs or gallops Abd-soft with active BS No Clubbing cyanosis 2+edema Skin-warm and dry A & Oriented  Grossly normal sensory and motor function   Labs    Chemistry Recent Labs  Lab 03/29/17 0926 03/30/17 0603  03/30/17 1426 03/31/17 0302  NA 140 139  --  139  K 3.9 3.8 4.4 4.2  CL 106 105  --  105  CO2 27 29  --  29  GLUCOSE 105* 112*  --  99  BUN 16 14  --  12  CREATININE 1.10 0.96  --  0.97  CALCIUM 8.9 8.6*  --  8.6*  GFRNONAA >60 >60  --  >60  GFRAA >60 >60  --  >60  ANIONGAP 7 5  --  5     Hematology Recent Labs  Lab 03/31/17 0302  WBC 7.7  RBC 4.87  HGB 14.2  HCT 43.7  MCV 89.7  MCH 29.2  MCHC 32.5  RDW 15.2  PLT 234    Cardiac EnzymesNo results for input(s): TROPONINI in the last 168 hours. No results for input(s): TROPIPOC in the last 168 hours.   BNPNo results for input(s): BNP, PROBNP in the last 168 hours.   DDimer No results for input(s): DDIMER in the last 168 hours.   Radiology    No results found.  Cardiac Studies   Dr. Fread Kottke's note references: "  Echocardiogram obtainedfrom spring summer 2018demonstrated an ejection fraction of 43% Wall thickness normal"    Patient Profile     66 y.o. male with history of persistent AFib,  HTN, OSA, mobid obesity, asthma rarely uses his Advair he says is a PRN use), and CBP with spinal stenosis, admitted for Tikosyn initiation  Assessment & Plan    1. Persistent AFib, here for Tikosyn initiation  Atrial fibrillation-persistent  Congestive heart failure presumably HFpEF  Morbid obesity   Hypertension   Cardiomyopathy-nonischemic presumably      Persistent AFib/flutter for DCCV today  Will begin aldactone for BP; cardiomyopathy and BP  Significant volume overload will use IV lasix   Will add aldactone for LV dysfunction and persistent hypertension  Will use IV lasix   Will anticipate repeat echo in about 6-8 weeks post cardioversion

## 2017-03-31 NOTE — Anesthesia Postprocedure Evaluation (Signed)
Anesthesia Post Note  Patient: Luke Morgan  Procedure(s) Performed: CARDIOVERSION (N/A )     Patient location during evaluation: PACU Anesthesia Type: General Level of consciousness: sedated Pain management: pain level controlled Vital Signs Assessment: post-procedure vital signs reviewed and stable Respiratory status: spontaneous breathing and respiratory function stable Cardiovascular status: stable Postop Assessment: no apparent nausea or vomiting Anesthetic complications: no    Last Vitals:  Vitals:   03/31/17 1020 03/31/17 1023  BP: 137/72 124/66  Pulse: 63 62  Resp: 17 16  Temp:    SpO2: 98% 99%    Last Pain:  Vitals:   03/31/17 1004  TempSrc: Oral  PainSc:                  Dharma Pare DANIEL

## 2017-03-31 NOTE — Interval H&P Note (Signed)
History and Physical Interval Note:  03/31/2017 9:51 AM  Luke Morgan  has presented today for surgery, with the diagnosis of a fib  The various methods of treatment have been discussed with the patient and family. After consideration of risks, benefits and other options for treatment, the patient has consented to  Procedure(s): CARDIOVERSION (N/A) as a surgical intervention .  The patient's history has been reviewed, patient examined, no change in status, stable for surgery.  I have reviewed the patient's chart and labs.  Questions were answered to the patient's satisfaction.     [employer]

## 2017-03-31 NOTE — Anesthesia Preprocedure Evaluation (Addendum)
Anesthesia Evaluation  Patient identified by MRN, date of birth, ID band Patient awake    Reviewed: Allergy & Precautions, NPO status , Patient's Chart, lab work & pertinent test results  History of Anesthesia Complications Negative for: history of anesthetic complications  Airway Mallampati: III  TM Distance: >3 FB Neck ROM: Full    Dental no notable dental hx. (+) Dental Advisory Given   Pulmonary sleep apnea and Continuous Positive Airway Pressure Ventilation ,    Pulmonary exam normal        Cardiovascular hypertension, Normal cardiovascular exam+ dysrhythmias Atrial Fibrillation   8/16 ECHO: ejection fraction of 43%, Wall thickness normal    Neuro/Psych negative neurological ROS  negative psych ROS   GI/Hepatic negative GI ROS,   Endo/Other  Morbid obesity  Renal/GU      Musculoskeletal   Abdominal   Peds  Hematology   Anesthesia Other Findings   Reproductive/Obstetrics                           Anesthesia Physical Anesthesia Plan  ASA: III  Anesthesia Plan: General   Post-op Pain Management:    Induction: Intravenous  PONV Risk Score and Plan: 2 and Treatment may vary due to age or medical condition  Airway Management Planned: Mask  Additional Equipment:   Intra-op Plan:   Post-operative Plan:   Informed Consent: I have reviewed the patients History and Physical, chart, labs and discussed the procedure including the risks, benefits and alternatives for the proposed anesthesia with the patient or authorized representative who has indicated his/her understanding and acceptance.   Dental advisory given  Plan Discussed with: CRNA and Anesthesiologist  Anesthesia Plan Comments:         Anesthesia Quick Evaluation

## 2017-03-31 NOTE — Procedures (Signed)
Electrical Cardioversion Procedure Note Luke Morgan [id number] [birth year]  Procedure: Electrical Cardioversion Indications:  Atrial Fibrillation  Procedure Details Consent: Risks of procedure as well as the alternatives and risks of each were explained to the (patient/caregiver).  Consent for procedure obtained. Time Out: Verified patient identification, verified procedure, site/side was marked, verified correct patient position, special equipment/implants available, medications/allergies/relevent history reviewed, required imaging and test results available.  Performed  Patient placed on cardiac monitor, pulse oximetry, supplemental oxygen as necessary.  Sedation given: Propofol per anesthesiology Pacer pads placed anterior and posterior chest.  Cardioverted 1 time(s).  Cardioverted at 200J.  Evaluation Findings: Post procedure EKG shows: NSR Complications: None Patient did tolerate procedure well.   Luke Morgan 03/31/2017, 9:56 AM

## 2017-03-31 NOTE — Progress Notes (Signed)
Progress Note  Patient Name: Luke SayresWilliam R Googe Morgan Date of Encounter: 03/31/2017  Primary Cardiologist: Dr. Graciela HusbandsKlein  Subjective   .without chest pain or shortness of breath   Inpatient Medications    Scheduled Meds: . acetaminophen  500 mg Oral BID  . allopurinol  300 mg Oral Daily  . apixaban  5 mg Oral BID  . dofetilide  500 mcg Oral BID  . furosemide  20 mg Oral Daily  . gabapentin  300 mg Oral TID  . hydrALAZINE  50 mg Oral TID  . losartan  100 mg Oral BID  . metFORMIN  500 mg Oral Q breakfast  . metoprolol succinate  100 mg Oral Daily  . multivitamin with minerals  1 tablet Oral Daily  . sodium chloride flush  3 mL Intravenous Q12H   Continuous Infusions: . sodium chloride     PRN Meds: sodium chloride, sodium chloride flush   Vital Signs    Vitals:   03/30/17 0554 03/30/17 1415 03/30/17 2144 03/31/17 0520  BP: (!) 141/87 132/82 (!) 138/92 137/86  Pulse: 75 86 76 91  Resp:  18    Temp: 98 F (36.7 C) 98.2 F (36.8 C) 98.2 F (36.8 C) 97.7 F (36.5 C)  TempSrc: Oral Oral Oral Oral  SpO2: 98% 97% 97% 96%  Weight: (!) 398 lb 4.8 oz (180.7 kg)     Height:        Intake/Output Summary (Last 24 hours) at 03/31/2017 0724 Last data filed at 03/30/2017 2230 Gross per 24 hour  Intake 840 ml  Output -  Net 840 ml   Filed Weights   03/29/17 1356 03/29/17 1800 03/30/17 0554  Weight: (!) 403 lb (182.8 kg) (!) 402 lb (182.3 kg) (!) 398 lb 4.8 oz (180.7 kg)    Telemetry    AFib Personally reviewed    ECG     Personally reviewed  22:54   QT about 400 mse andQTc about 480  Physical Exam   Well developed and nourished in no acute distress HENT normal Neck supple with JVP-flat Clear Irregularly irregular rate and rhythm, no murmurs or gallops Abd-soft with active BS No Clubbing cyanosis 2+edema Skin-warm and dry A & Oriented  Grossly normal sensory and motor function   Labs    Chemistry Recent Labs  Lab 03/29/17 0926 03/30/17 0603  03/30/17 1426 03/31/17 0302  NA 140 139  --  139  K 3.9 3.8 4.4 4.2  CL 106 105  --  105  CO2 27 29  --  29  GLUCOSE 105* 112*  --  99  BUN 16 14  --  12  CREATININE 1.10 0.96  --  0.97  CALCIUM 8.9 8.6*  --  8.6*  GFRNONAA >60 >60  --  >60  GFRAA >60 >60  --  >60  ANIONGAP 7 5  --  5     Hematology Recent Labs  Lab 03/31/17 0302  WBC 7.7  RBC 4.87  HGB 14.2  HCT 43.7  MCV 89.7  MCH 29.2  MCHC 32.5  RDW 15.2  PLT 234    Cardiac EnzymesNo results for input(s): TROPONINI in the last 168 hours. No results for input(s): TROPIPOC in the last 168 hours.   BNPNo results for input(s): BNP, PROBNP in the last 168 hours.   DDimer No results for input(s): DDIMER in the last 168 hours.   Radiology    No results found.  Cardiac Studies   Dr. Odessa FlemingKlein's note references: "  Echocardiogram obtainedfrom spring summer 2018demonstrated an ejection fraction of 43% Wall thickness normal"    Patient Profile     66 y.o. male with history of persistent AFib,  HTN, OSA, mobid obesity, asthma rarely uses his Advair he says is a PRN use), and CBP with spinal stenosis, admitted for Tikosyn initiation  Assessment & Plan    1. Persistent AFib, here for Tikosyn initiation  Atrial fibrillation-persistent  Congestive heart failure presumably HFpEF  Morbid obesity   Hypertension   Cardiomyopathy-nonischemic presumably      Persistent AFib/flutter for DCCV today  Will begin aldactone for BP; cardiomyopathy and BP  Significant volume overload will use IV lasix   Will add aldactone for LV dysfunction and persistent hypertension  Will use IV lasix   Will anticipate repeat echo in about 6-8 weeks post cardioversion

## 2017-03-31 NOTE — Progress Notes (Signed)
Patient will go for a cardioversion today, has been NPO since last night, still in A-fib after 3rd does given of Tykosin.

## 2017-03-31 NOTE — Discharge Summary (Signed)
ELECTROPHYSIOLOGY PROCEDURE DISCHARGE SUMMARY    Patient ID: Luke Morgan,  MRN: [id number], DOB/AGE: 65-12-1950 65 y.o.  Admit date: 03/29/2017 Discharge date:    Primary Care Physician: Barbette Reichmann, MD Primary Cardiologist/Electrophysiologist: Dr. Graciela Husbands  Primary Discharge Diagnosis:  1.  persistent atrial fibrillation status post Tikosyn loading this admission  Secondary Discharge Diagnosis:  1. HTN 2. OSA 3. Super Morbid obesity 4. CBP  Allergies  Allergen Reactions  . Penicillins Other (See Comments)    Has patient had a PCN reaction causing immediate rash, facial/tongue/throat swelling, SOB or lightheadedness with hypotension: Yes Has patient had a PCN reaction causing severe rash involving mucus membranes or skin necrosis: No Has patient had a PCN reaction that required hospitalization No Has patient had a PCN reaction occurring within the last 10 years: No If all of the above answers are "NO", then may proceed with Cephalosporin use.     Procedures This Admission:  1.  Tikosyn loading 2.  Direct current cardioversion on 03/31/17 by Dr Shirlee Latch which successfully restored SR.  There were no early apparent complications.   Brief HPI: Luke Morgan is a 67 y.o. male with a past medical history as noted above. He was referred to  EP in the outpatient setting for treatment options of atrial fibrillation.  Risks, benefits, and alternatives to Tikosyn were reviewed with the patient who wished to proceed.    Hospital Course:  The patient was admitted and Tikosyn was initiated.  Renal function and electrolytes were followed during the hospitalization.  He required diuresis and potassium replacement, we have adjusted his home regime.   His QTc remained stable.  On 03/31/17 the patient underwent direct current cardioversion which restored sinus rhythm.  He was monitored until discharge on telemetry which demonstrated sinus.  On the day of discharge, the  patient was examined by Dr  Graciela Husbands who considered him stable for discharge to home.  Follow-up has been arranged with the AFib clinic in 1 week and with Dr  Graciela Husbands in 4 weeks.   Physical Exam: Vitals:   03/31/17 2018 03/31/17 2309 04/01/17 0500 04/01/17 0822  BP: 126/70 135/87 125/71 (!) 129/94  Pulse: 63  64 77  Resp: 18  18   Temp: 97.9 F (36.6 C)  98 F (36.7 C)   TempSrc: Oral  Oral   SpO2: 98%  97%   Weight:   (!) 395 lb 6.4 oz (179.4 kg)   Height:        GEN- The patient is well appearing, alert and oriented x 3 today.  Morbidly obese  HEENT: normocephalic, atraumatic; sclera clear, conjunctiva pink; hearing intact; oropharynx clear; neck supple, no JVP Lymph- no cervical lymphadenopathy Lungs- CTA b/l, normal work of breathing.  No wheezes, rales, rhonchi Heart-  RRR, no murmurs, rubs or gallops, PMI not laterally displaced GI- soft, non-tender, non-distended, obese Extremities- no clubbing, cyanosis, trace  edema MS- no significant deformity or atrophy Skin- warm and dry, no rash or lesion Psych- euthymic mood, full affect Neuro- strength and sensation are intact   Labs:   Lab Results  Component Value Date   WBC 7.7 03/31/2017   HGB 14.2 03/31/2017   HCT 43.7 03/31/2017   MCV 89.7 03/31/2017   PLT 234 03/31/2017    Recent Labs  Lab 04/01/17 0346  NA 138  K 3.5  CL 100*  CO2 29  BUN 15  CREATININE 1.02  CALCIUM 8.8*  GLUCOSE 95  Discharge Medications:  Allergies as of 04/01/2017      Reactions   Penicillins Other (See Comments)   Has patient had a PCN reaction causing immediate rash, facial/tongue/throat swelling, SOB or lightheadedness with hypotension: Yes Has patient had a PCN reaction causing severe rash involving mucus membranes or skin necrosis: No Has patient had a PCN reaction that required hospitalization No Has patient had a PCN reaction occurring within the last 10 years: No If all of the above answers are "NO", then may proceed with  Cephalosporin use.      Medication List    TAKE these medications   acetaminophen 500 MG tablet Commonly known as:  TYLENOL Take 500 mg by mouth 2 (two) times daily.   allopurinol 300 MG tablet Commonly known as:  ZYLOPRIM Take 300 mg by mouth daily.   dofetilide 500 MCG capsule Commonly known as:  TIKOSYN Take 1 capsule (500 mcg total) 2 (two) times daily by mouth.   ELIQUIS 5 MG Tabs tablet Generic drug:  apixaban Take 5 mg by mouth 2 (two) times daily.   Fluticasone-Salmeterol 100-50 MCG/DOSE Aepb Commonly known as:  ADVAIR Inhale 1 puff into the lungs 2 (two) times daily as needed.   furosemide 40 MG tablet Commonly known as:  LASIX Take 1 tablet (40 mg total) every other day by mouth. What changed:    medication strength  how much to take  how to take this  when to take this  additional instructions   gabapentin 300 MG capsule Commonly known as:  NEURONTIN Take 300 mg by mouth 3 (three) times daily.   hydrALAZINE 50 MG tablet Commonly known as:  APRESOLINE Take 50 mg by mouth 3 (three) times daily.   losartan 100 MG tablet Commonly known as:  COZAAR Take 1 tablet (100 mg total) by mouth 2 (two) times daily.   metFORMIN 500 MG tablet Commonly known as:  GLUCOPHAGE Take 500 mg daily with breakfast by mouth.   metoprolol succinate 100 MG 24 hr tablet Commonly known as:  TOPROL-XL Take 100 mg by mouth daily. Take with or immediately following a meal.   multivitamin with minerals tablet Take 1 tablet by mouth daily.   potassium chloride 10 MEQ tablet Commonly known as:  K-DUR Take 1 tablet (10 mEq total) every other day by mouth. On days you take furosemide   spironolactone 25 MG tablet Commonly known as:  ALDACTONE Take 1 tablet (25 mg total) daily by mouth.     Afib  In sinus HFPEF Hypokalemia Hypertension  Much better following diuresis and aldactone   Will give one more dose of IV lasix prior to discharge Discharge on 40  qod Aldactone 25 daily AFib clinic appt per protocol Recommended obesity reduction surgery consideration    Disposition: Home Discharge Instructions    Diet - low sodium heart healthy   Complete by:  As directed    Increase activity slowly   Complete by:  As directed      Follow-up Information    [facility] Follow up on 04/08/2017.   Specialty:  Cardiology Why:  9:30AM Contact information: [address] [id number] mc [place] [address] [phone]       Duke Salvia, MD Follow up on 05/05/2017.   Specialty:  Cardiology Why:  9:15AM Contact information: [address] [place] [address] [phone]           Duration of Discharge Encounter: Greater than 30 minutes  including physician time.  Norma FredricksonSigned, Mekiah Wahler, PA-C 04/01/2017 1:11 PM

## 2017-03-31 NOTE — Progress Notes (Signed)
Patient back from cardioversion, is in NSR, A&OX4, VSS, patient up independent, denies any SOB, tolerating well. All patients morning medications given at this time. Pt is resting comfortably at this time.

## 2017-03-31 NOTE — Transfer of Care (Signed)
Immediate Anesthesia Transfer of Care Note  Patient: Luke Morgan  Procedure(s) Performed: CARDIOVERSION (N/A )  Patient Location: Endoscopy Unit  Anesthesia Type:General  Level of Consciousness: awake, patient cooperative and responds to stimulation  Airway & Oxygen Therapy: Patient Spontanous Breathing and Patient connected to face mask oxygen  Post-op Assessment: Report given to RN, Post -op Vital signs reviewed and stable and Patient moving all extremities X 4  Post vital signs: Reviewed and stable  Last Vitals:  Vitals:   03/31/17 0520 03/31/17 0827  BP: 137/86 (!) 150/120  Pulse: 91   Resp:  17  Temp: 36.5 C 36.6 C  SpO2: 96% 98%    Last Pain:  Vitals:   03/31/17 0827  TempSrc: Oral  PainSc:       Patients Stated Pain Goal: 0 (03/30/17 0800)  Complications: No apparent anesthesia complications

## 2017-04-01 ENCOUNTER — Encounter (HOSPITAL_COMMUNITY): Payer: Self-pay | Admitting: Cardiology

## 2017-04-01 LAB — MAGNESIUM: MAGNESIUM: 2.2 mg/dL (ref 1.7–2.4)

## 2017-04-01 LAB — BASIC METABOLIC PANEL
ANION GAP: 9 (ref 5–15)
BUN: 15 mg/dL (ref 6–20)
CHLORIDE: 100 mmol/L — AB (ref 101–111)
CO2: 29 mmol/L (ref 22–32)
Calcium: 8.8 mg/dL — ABNORMAL LOW (ref 8.9–10.3)
Creatinine, Ser: 1.02 mg/dL (ref 0.61–1.24)
GFR calc Af Amer: >60 mL/min (ref >60)
GLUCOSE: 95 mg/dL (ref 65–99)
POTASSIUM: 3.5 mmol/L (ref 3.5–5.1)
Sodium: 138 mmol/L (ref 135–145)

## 2017-04-01 MED ORDER — FUROSEMIDE 10 MG/ML IJ SOLN
80.0000 mg | Freq: Once | INTRAMUSCULAR | Status: AC
  Administered 2017-04-01: 80 mg via INTRAVENOUS
  Filled 2017-04-01: qty 8

## 2017-04-01 MED ORDER — POTASSIUM CHLORIDE ER 10 MEQ PO TBCR: 10.0000 meq | EXTENDED_RELEASE_TABLET | ORAL | 6 refills | Status: DC

## 2017-04-01 MED ORDER — POTASSIUM CHLORIDE CRYS ER 20 MEQ PO TBCR
40.0000 meq | EXTENDED_RELEASE_TABLET | Freq: Once | ORAL | Status: AC
  Administered 2017-04-01: 40 meq via ORAL
  Filled 2017-04-01: qty 2

## 2017-04-01 MED ORDER — DOFETILIDE 500 MCG PO CAPS: 500.0000 ug | ORAL_CAPSULE | Freq: Two times a day (BID) | ORAL | 6 refills | Status: DC

## 2017-04-01 MED ORDER — SPIRONOLACTONE 25 MG PO TABS: 25.0000 mg | ORAL_TABLET | Freq: Every day | ORAL | 11 refills | Status: AC

## 2017-04-01 MED ORDER — FUROSEMIDE 40 MG PO TABS: 40.0000 mg | ORAL_TABLET | ORAL | 6 refills | Status: DC

## 2017-04-01 NOTE — Discharge Instructions (Signed)

## 2017-04-01 NOTE — Plan of Care (Signed)
Patient remains in NSR with Qtc WNL

## 2017-04-01 NOTE — Progress Notes (Signed)
Luke Morgan to be D/C'd home per MD order.  Discussed with the patient and all questions fully answered. Seven day supply of Tikosyn provided prior to patient leaving.  VSS, Skin clean, dry and intact without evidence of skin break down, no evidence of skin tears noted. IV catheter discontinued intact. Site without signs and symptoms of complications. Dressing and pressure applied.  An After Visit Summary was printed and given to the patient. Patient received prescription.  D/c education completed with patient/family including follow up instructions, medication list, d/c activities limitations if indicated, with other d/c instructions as indicated by MD - patient able to verbalize understanding, all questions fully answered.   Patient instructed to return to ED, call 911, or call MD for any changes in condition.   Patient escorted via WC, and D/C home via private auto.  [name] 04/01/2017 3:05 PM

## 2017-04-08 ENCOUNTER — Ambulatory Visit (HOSPITAL_COMMUNITY)
Admit: 2017-04-08 | Discharge: 2017-04-08 | Disposition: A | Discharge status: Home / Self Care | Payer: Medicare Other | Source: Ambulatory Visit | Attending: Nurse Practitioner | Admitting: Nurse Practitioner

## 2017-04-08 ENCOUNTER — Encounter (HOSPITAL_COMMUNITY): Payer: Self-pay | Admitting: Nurse Practitioner

## 2017-04-08 VITALS — BP 136/84 | HR 63 | Ht 77.0 in | Wt 398.0 lb

## 2017-04-08 DIAGNOSIS — G4730 Sleep apnea, unspecified: Secondary | ICD-10-CM | POA: Diagnosis not present

## 2017-04-08 DIAGNOSIS — Z820 Family history of epilepsy and other diseases of the nervous system: Secondary | ICD-10-CM | POA: Diagnosis not present

## 2017-04-08 DIAGNOSIS — Z8249 Family history of ischemic heart disease and other diseases of the circulatory system: Secondary | ICD-10-CM | POA: Diagnosis not present

## 2017-04-08 DIAGNOSIS — Z7984 Long term (current) use of oral hypoglycemic drugs: Secondary | ICD-10-CM | POA: Diagnosis not present

## 2017-04-08 DIAGNOSIS — I4819 Other persistent atrial fibrillation: Secondary | ICD-10-CM

## 2017-04-08 DIAGNOSIS — Z87442 Personal history of urinary calculi: Secondary | ICD-10-CM | POA: Diagnosis not present

## 2017-04-08 DIAGNOSIS — J45909 Unspecified asthma, uncomplicated: Secondary | ICD-10-CM | POA: Diagnosis not present

## 2017-04-08 DIAGNOSIS — Z803 Family history of malignant neoplasm of breast: Secondary | ICD-10-CM | POA: Insufficient documentation

## 2017-04-08 DIAGNOSIS — Z7901 Long term (current) use of anticoagulants: Secondary | ICD-10-CM | POA: Insufficient documentation

## 2017-04-08 DIAGNOSIS — M1990 Unspecified osteoarthritis, unspecified site: Secondary | ICD-10-CM | POA: Insufficient documentation

## 2017-04-08 DIAGNOSIS — I481 Persistent atrial fibrillation: Secondary | ICD-10-CM | POA: Diagnosis present

## 2017-04-08 DIAGNOSIS — I10 Essential (primary) hypertension: Secondary | ICD-10-CM | POA: Diagnosis not present

## 2017-04-08 DIAGNOSIS — Z880 Allergy status to penicillin: Secondary | ICD-10-CM | POA: Diagnosis not present

## 2017-04-08 DIAGNOSIS — Z79899 Other long term (current) drug therapy: Secondary | ICD-10-CM | POA: Insufficient documentation

## 2017-04-08 DIAGNOSIS — E669 Obesity, unspecified: Secondary | ICD-10-CM | POA: Diagnosis not present

## 2017-04-08 LAB — MAGNESIUM: Magnesium: 2.2 mg/dL (ref 1.7–2.4)

## 2017-04-08 NOTE — Progress Notes (Signed)
Primary Care Physician: Barbette ReichmannHande, Vishwanath, MD Referring Physician: Dr. Adalberto IllKlein   Triton R Ivery III is a 66 y.o. male with a h/o persistent afib that previously failed cardioversion, seen over a week ago  in the afib clinic for Tikosyn admit per Dr.Klein. His meds were reviewed by PharmD, and per recommendations, he is now off HCTZ and Rythmol for many days. No missed eliquis doses. No use of benadryl.  F/u in afib clinic, 11/16 for f/u, he appears to be staying in SR since d/c from hospital. Taking Tikosyn as prescribed. He feels about the same in SR. He is wanting a cortisone shot in his knee but reminded that he can not stop DOAC for 30 days follow cardioversion.  Today, he denies symptoms of palpitations, chest pain, shortness of breath, orthopnea, PND, lower extremity edema, dizziness, presyncope, syncope, or neurologic sequela. The patient is tolerating medications without difficulties and is otherwise without complaint today.   Past Medical History:  Diagnosis Date  . Asthma   . Atrial fibrillation (HCC)   . Hypertension   . Kidney stones   . Obesity   . Osteoarthritis   . Sleep apnea    Past Surgical History:  Procedure Laterality Date  . CARDIOVERSION N/A 03/31/2017   Performed by Laurey MoraleMcLean, Ferner S, MD [facility]  . CARDIOVERSION N/A 01/04/2017   Performed by Duke SalviaKlein, Steven C, [facility]  . CARDIOVERSION N/A 12/24/2016   Performed by Antonieta IbaGollan, Timothy J, [facility]    Current Outpatient Medications  Medication Sig Dispense Refill  . acetaminophen (TYLENOL) 500 MG tablet Take 500 mg by mouth 2 (two) times daily.     Marland Kitchen. allopurinol (ZYLOPRIM) 300 MG tablet Take 300 mg by mouth daily.    Marland Kitchen. apixaban (ELIQUIS) 5 MG TABS tablet Take 5 mg by mouth 2 (two) times daily.    Marland Kitchen. dofetilide (TIKOSYN) 500 MCG capsule Take 1 capsule (500 mcg total) 2 (two) times daily by mouth. 60 capsule 6  . Fluticasone-Salmeterol (ADVAIR) 100-50 MCG/DOSE AEPB Inhale 1 puff into the lungs 2  (two) times daily as needed.     . furosemide (LASIX) 40 MG tablet Take 1 tablet (40 mg total) every other day by mouth. 15 tablet 6  . gabapentin (NEURONTIN) 300 MG capsule Take 300 mg by mouth 3 (three) times daily.    . hydrALAZINE (APRESOLINE) 50 MG tablet Take 50 mg by mouth 3 (three) times daily.    Marland Kitchen. losartan (COZAAR) 100 MG tablet Take 1 tablet (100 mg total) by mouth 2 (two) times daily. 60 tablet 3  . metFORMIN (GLUCOPHAGE) 500 MG tablet Take 500 mg daily with breakfast by mouth.     . metoprolol succinate (TOPROL-XL) 100 MG 24 hr tablet Take 100 mg by mouth daily. Take with or immediately following a meal.    . Multiple Vitamins-Minerals (MULTIVITAMIN WITH MINERALS) tablet Take 1 tablet by mouth daily.    . potassium chloride (K-DUR) 10 MEQ tablet Take 1 tablet (10 mEq total) every other day by mouth. On days you take furosemide 15 tablet 6  . spironolactone (ALDACTONE) 25 MG tablet Take 1 tablet (25 mg total) daily by mouth. 30 tablet 11   No current facility-administered medications for this encounter.     Allergies  Allergen Reactions  . Penicillins Other (See Comments)    Has patient had a PCN reaction causing immediate rash, facial/tongue/throat swelling, SOB or lightheadedness with hypotension: Yes Has patient had a PCN reaction  causing severe rash involving mucus membranes or skin necrosis: No Has patient had a PCN reaction that required hospitalization No Has patient had a PCN reaction occurring within the last 10 years: No If all of the above answers are "NO", then may proceed with Cephalosporin use.    Social History   Socioeconomic History  . Marital status: Married    Spouse name: Not on file  . Number of children: Not on file  . Years of education: Not on file  . Highest education level: Not on file  Social Needs  . Financial resource strain: Not on file  . Food insecurity - worry: Not on file  . Food insecurity - inability: Not on file  . Transportation  needs - medical: Not on file  . Transportation needs - non-medical: Not on file  Occupational History  . Not on file  Tobacco Use  . Smoking status: Never Smoker  . Smokeless tobacco: Never Used  Substance and Sexual Activity  . Alcohol use: Yes    Comment: meals  . Drug use: No  . Sexual activity: Not on file  Other Topics Concern  . Not on file  Social History Narrative  . Not on file    Family History  Problem Relation Age of Onset  . Parkinson's disease Mother   . Cancer Mother        breast  . Heart disease Father   . Cancer Paternal Grandfather        colon    ROS- All systems are reviewed and negative except as per the HPI above  Physical Exam: Vitals:   04/08/17 0945  BP: 136/84  Pulse: 63  Weight: (!) 398 lb (180.5 kg)  Height: 6\' 5"  (1.956 m)   Wt Readings from Last 3 Encounters:  04/08/17 (!) 398 lb (180.5 kg)  04/01/17 (!) 395 lb 6.4 oz (179.4 kg)  03/29/17 (!) 403 lb 6.4 oz (183 kg)    Labs: Lab Results  Component Value Date   NA 138 04/01/2017   K 3.5 04/01/2017   CL 100 (L) 04/01/2017   CO2 29 04/01/2017   GLUCOSE 95 04/01/2017   BUN 15 04/01/2017   CREATININE 1.02 04/01/2017   CALCIUM 8.8 (L) 04/01/2017   MG 2.2 04/01/2017   No results found for: INR No results found for: CHOL, HDL, LDLCALC, TRIG   GEN- The patient is well appearing, alert and oriented x 3 today.   Head- normocephalic, atraumatic Eyes-  Sclera clear, conjunctiva pink Ears- hearing intact Oropharynx- clear Neck- supple, no JVP Lymph- no cervical lymphadenopathy Lungs- Clear to ausculation bilaterally, normal work of breathing Heart-  regular rate and rhythm, no murmurs, rubs or gallops, PMI not laterally displaced GI- soft, NT, ND, + BS Extremities- no clubbing, cyanosis, or edema MS- no significant deformity or atrophy Skin- no rash or lesion Psych- euthymic mood, full affect Neuro- strength and sensation are intact  EKG- NSR at 63 bpm, pr int 200 ms, qrs  int 84 ms, qtc 464 ms Epic records  Reviewed   Assessment and Plan: 1. Persistent afib General review of Tikosyn guidelines  Appears to be staying in SR with stable qtc Continue eliquis for chadsvasc score of at least 2, reminded not to miss any anticoagulation for at least 30 days following cardioversion No benadryl use Bmet/mag today   F/u with Dr. Graciela Husbands 12/13   Lupita Leash C. [facility] [address] [place], [place] [address] [phone]

## 2017-04-13 ENCOUNTER — Encounter: Payer: Self-pay | Admitting: Internal Medicine

## 2017-05-04 ENCOUNTER — Ambulatory Visit: Payer: Medicare Other | Attending: Internal Medicine

## 2017-05-04 DIAGNOSIS — G4733 Obstructive sleep apnea (adult) (pediatric): Secondary | ICD-10-CM | POA: Diagnosis not present

## 2017-05-04 DIAGNOSIS — G4761 Periodic limb movement disorder: Secondary | ICD-10-CM | POA: Insufficient documentation

## 2017-05-04 DIAGNOSIS — G4700 Insomnia, unspecified: Secondary | ICD-10-CM | POA: Diagnosis present

## 2017-05-05 ENCOUNTER — Ambulatory Visit (INDEPENDENT_AMBULATORY_CARE_PROVIDER_SITE_OTHER): Payer: Medicare Other | Admitting: Internal Medicine

## 2017-05-05 ENCOUNTER — Encounter: Payer: Self-pay | Admitting: Internal Medicine

## 2017-05-05 VITALS — BP 100/60 | HR 100 | Ht 77.0 in | Wt 394.5 lb

## 2017-05-05 DIAGNOSIS — I481 Persistent atrial fibrillation: Secondary | ICD-10-CM | POA: Diagnosis not present

## 2017-05-05 DIAGNOSIS — Z79899 Other long term (current) drug therapy: Secondary | ICD-10-CM

## 2017-05-05 DIAGNOSIS — I4819 Other persistent atrial fibrillation: Secondary | ICD-10-CM

## 2017-05-05 MED ORDER — LOSARTAN POTASSIUM 100 MG PO TABS: 100.0000 mg | ORAL_TABLET | Freq: Every day | ORAL | Status: DC

## 2017-05-05 NOTE — Progress Notes (Signed)
Patient Care Team: Barbette Reichmann, MD as PCP - General (Internal Medicine)   HPI  Luke Morgan is a 66 y.o. male Seen with a chief complaint of atrial fibrillation with initial failure to cardiovert.  Now he is treated with dofetilide  We undertook chest compression facilitated DCCV 8/18 with propafenone with successful restoration of sinus;  He reverted to afib within few weeks  No appreciable change in symptoms  He underwent cardioversion again in November.  Again no appreciable improvement in symptoms nor any worsening with reversion recently to atrial fibrillation.  ECG was reviewed from Dr. Renie Ora office where he was in sinus rhythm at the end of November     Records and Results Reviewed  Echocardiogram obtained from spring summer 2018 demonstrated an ejection fraction of 43% Wall thickness normal   Date Cr K Hgb  7/18  0.88 4.0 15.5  11/18  1.02 3.5 14.2    Thromboembolic risk factors ( age -39 , HTN-1,) for a CHADSVASc Score of  2 Past Medical History:  Diagnosis Date  . Asthma   . Atrial fibrillation (HCC)   . Hypertension   . Kidney stones   . Obesity   . Osteoarthritis   . Sleep apnea     Past Surgical History:  Procedure Laterality Date  . CARDIOVERSION N/A 12/24/2016   Procedure: CARDIOVERSION;  Surgeon: Antonieta Iba, MD;  Location: [facility] ORS;  Service: Cardiovascular;  Laterality: N/A;  . CARDIOVERSION N/A 01/04/2017   Procedure: CARDIOVERSION;  Surgeon: Duke Salvia, MD;  Location: [facility] ENDOSCOPY;  Service: Cardiovascular;  Laterality: N/A;  . CARDIOVERSION N/A 03/31/2017   Procedure: CARDIOVERSION;  Surgeon: Laurey Morale, MD;  Location: [facility] ENDOSCOPY;  Service: Cardiovascular;  Laterality: N/A;    Current Outpatient Medications  Medication Sig Dispense Refill  . acetaminophen (TYLENOL) 500 MG tablet Take 500 mg by mouth 2 (two) times daily.     Marland Kitchen allopurinol (ZYLOPRIM) 300 MG tablet Take 300 mg by mouth daily.    Marland Kitchen apixaban  (ELIQUIS) 5 MG TABS tablet Take 5 mg by mouth 2 (two) times daily.    Marland Kitchen dofetilide (TIKOSYN) 500 MCG capsule Take 1 capsule (500 mcg total) 2 (two) times daily by mouth. 60 capsule 6  . Fluticasone-Salmeterol (ADVAIR) 100-50 MCG/DOSE AEPB Inhale 1 puff into the lungs 2 (two) times daily as needed.     . furosemide (LASIX) 40 MG tablet Take 1 tablet (40 mg total) every other day by mouth. 15 tablet 6  . gabapentin (NEURONTIN) 300 MG capsule Take 300 mg by mouth 3 (three) times daily.    . hydrALAZINE (APRESOLINE) 50 MG tablet Take 50 mg by mouth 3 (three) times daily.    Marland Kitchen losartan (COZAAR) 100 MG tablet Take 1 tablet (100 mg total) by mouth 2 (two) times daily. 60 tablet 3  . metFORMIN (GLUCOPHAGE) 500 MG tablet Take 500 mg daily with breakfast by mouth.     . metoprolol succinate (TOPROL-XL) 100 MG 24 hr tablet Take 100 mg by mouth daily. Take with or immediately following a meal.    . Multiple Vitamins-Minerals (MULTIVITAMIN WITH MINERALS) tablet Take 1 tablet by mouth daily.    . potassium chloride (K-DUR) 10 MEQ tablet Take 1 tablet (10 mEq total) every other day by mouth. On days you take furosemide 15 tablet 6  . spironolactone (ALDACTONE) 25 MG tablet Take 1 tablet (25 mg total) daily by mouth. 30 tablet 11   No current  facility-administered medications for this visit.     Allergies  Allergen Reactions  . Penicillins Other (See Comments)    Has patient had a PCN reaction causing immediate rash, facial/tongue/throat swelling, SOB or lightheadedness with hypotension: Yes Has patient had a PCN reaction causing severe rash involving mucus membranes or skin necrosis: No Has patient had a PCN reaction that required hospitalization No Has patient had a PCN reaction occurring within the last 10 years: No If all of the above answers are "NO", then may proceed with Cephalosporin use.      Review of Systems negative except from HPI and PMH  Physical Exam BP 100/60 (BP Location: Left Arm,  Patient Position: Sitting, Cuff Size: Normal)   Pulse 100   Ht 6\' 5"  (1.956 m)   Wt (!) 394 lb 8 oz (178.9 kg)   BMI 46.78 kg/m  Well developed and Morbidly obese  in no acute distress HENT normal Neck supple with JVP-flat Carotids brisk and full without bruits Clear Irregular rate and rhythm, no murmurs or gallops Abd-soft with active BS without hepatomegaly No Clubbing cyanosis 2+edema Skin-warm and dry A & Oriented  Grossly normal sensory and motor function   ECG: To assess rhythm and rate  AF  @ 100 Assessment and  Plan  Atrial fibrillation-persistent  Congestive heart failure presumably HFpEF  Morbid obesity   Hypertension   Cardiomyopathy-nonischemic presumably    The patient has recurrent persistent atrial fibrillation without improvement in symptoms with intervals of sinus rhythm.  Hence, we will discontinue his dofetilide.  We will need to work on rate control.  We have reviewed the use of a pulse oximeter.  Sinus rhythm for him is in the 60s; atrial fibrillation rates have been in the 90-100s.  He will keep a log.  We have the option of increasing his metoprolol.  He has had some lightheadedness of late and lower blood pressures so we will decrease his losartan from 100 twice daily--daily with the option of up titrating his metoprolol if rates or blood pressure require.  No bleeding issues.  Euvolemic.  Continue current medications.  We will also need to keep track of his potassium on his Aldactone.  We will check it today.

## 2017-05-05 NOTE — Patient Instructions (Addendum)
Medication Instructions: - Your physician has recommended you make the following change in your medication:  1) STOP Tikosyn 2) DECREASE cozaar (losartan) 100 mg- take 1 tablet by mouth ONCE daily  Labwork: - Your physician recommends that you have lab work today: BMP  Procedures/Testing: - none ordered  Follow-Up: - Your physician recommends that you schedule a follow-up appointment in: 2 months with Dr. Graciela Husbands.   Any Additional Special Instructions Will Be Listed Below (If Applicable). - Please obtain a pulse oximeter to track your heart rate readings, keep a log of this periodically, and bring them with you when you come back to see Dr. Graciela Husbands in 2 months.  - if your heart rate is above consistently running above 100, then call the office to let Dr. Graciela Husbands know.     If you need a refill on your cardiac medications before your next appointment, please call your pharmacy.

## 2017-05-06 LAB — BASIC METABOLIC PANEL
BUN / CREAT RATIO: 23 (ref 10–24)
BUN: 24 mg/dL (ref 8–27)
CO2: 24 mmol/L (ref 20–29)
CREATININE: 1.04 mg/dL (ref 0.76–1.27)
Calcium: 9.1 mg/dL (ref 8.6–10.2)
Chloride: 105 mmol/L (ref 96–106)
GFR calc Af Amer: 87 mL/min/{1.73_m2} (ref >59)
GFR, EST NON AFRICAN AMERICAN: 75 mL/min/{1.73_m2} (ref >59)
Glucose: 131 mg/dL — ABNORMAL HIGH (ref 65–99)
POTASSIUM: 4.4 mmol/L (ref 3.5–5.2)
SODIUM: 141 mmol/L (ref 134–144)

## 2017-05-16 ENCOUNTER — Other Ambulatory Visit: Payer: Self-pay | Admitting: Neurosurgery

## 2017-05-16 DIAGNOSIS — M4300 Spondylolysis, site unspecified: Secondary | ICD-10-CM

## 2017-05-20 ENCOUNTER — Ambulatory Visit
Admission: RE | Admit: 2017-05-20 | Discharge: 2017-05-20 | Disposition: A | Discharge status: Home / Self Care | Payer: Medicare Other | Source: Ambulatory Visit | Attending: Neurosurgery | Admitting: Neurosurgery

## 2017-05-20 DIAGNOSIS — M4300 Spondylolysis, site unspecified: Secondary | ICD-10-CM

## 2017-05-20 DIAGNOSIS — M5137 Other intervertebral disc degeneration, lumbosacral region: Secondary | ICD-10-CM | POA: Diagnosis not present

## 2017-05-20 DIAGNOSIS — M4316 Spondylolisthesis, lumbar region: Secondary | ICD-10-CM | POA: Diagnosis present

## 2017-05-20 DIAGNOSIS — M4807 Spinal stenosis, lumbosacral region: Secondary | ICD-10-CM | POA: Diagnosis not present

## 2017-05-20 DIAGNOSIS — M48061 Spinal stenosis, lumbar region without neurogenic claudication: Secondary | ICD-10-CM | POA: Diagnosis not present

## 2017-05-26 ENCOUNTER — Encounter: Payer: Self-pay | Admitting: Internal Medicine

## 2017-05-31 ENCOUNTER — Telehealth: Payer: Self-pay | Admitting: Internal Medicine

## 2017-05-31 ENCOUNTER — Telehealth: Payer: Self-pay

## 2017-05-31 ENCOUNTER — Other Ambulatory Visit: Payer: Self-pay | Admitting: Neurosurgery

## 2017-05-31 NOTE — Telephone Encounter (Signed)
Error

## 2017-05-31 NOTE — Telephone Encounter (Signed)
Request for surgical clearance:  1. What type of surgery is being performed?  Posterior Lumbar Fusion   2. When is this surgery scheduled? 06/22/17   3. Are there any medications that need to be held prior to surgery and how long?  Eliquis 5 days prior to procedure  4. Name of physician performing surgery?  Dr. Tressie Stalker with [facility]  5. What is your office phone and fax number?  Phone [phone]  Fax [phone] Alison Stalling

## 2017-06-01 NOTE — Telephone Encounter (Signed)
  Pre-operative Risk Assessment - Provider Statement    Patient ID:  Luke Morgan, DOB: 1951-03-17, MRN: [id number]   Mr. Compean's chart has been reviewed for pre-operative risk assessment.  The patient was recently seen by Dr. Graciela Husbands 05/05/17.  I reviewed his case with Dr. Graciela Husbands today.  Based upon the patient's past medical history and time since last visit, and according to ACC/AHA guidelines, Luke Morgan would be at acceptable risk for the planned procedure without further cardiovascular testing.  I will forward note to our anticoagulation clinic to address recommendations regarding his Apixaban.  Signed,  Tereso Newcomer, PA-C  06/01/2017 5:12 PM

## 2017-06-03 NOTE — Telephone Encounter (Signed)
Patient with diagnosis of AFib on Eliquis for anticoagulation.    Procedure: Lumbar Fusion Date of procedure: Jan/30/2019  CHADS2-VASc score of  3 ( HTN, AGE, HF)   CrCl = 89 ml/min (with IBW)  Per office protocol, patient can hold Eliquis for 3days prior to procedure.  Patient will not need bridging with Lovenox (enoxaparin) around procedure.  Patient should restart day after procedure , at discretion of procedure MD.  Pearletha Furl PharmD, BCPS, CPP [facility] [address] [place] [address] 06/03/2017 2:01 PM

## 2017-06-06 NOTE — Telephone Encounter (Signed)
Please notify pt

## 2017-06-07 NOTE — Telephone Encounter (Signed)
D/w Raquel she states that only 3 days is acceptable for this pt, she states that if surgeon wants to stop for longer he will have to ok that, against our direction.

## 2017-06-07 NOTE — Telephone Encounter (Signed)
Faxed to Dr. Tressie Stalker with [facility] via Epic

## 2017-06-07 NOTE — Telephone Encounter (Signed)
Called pt to notify and he is asking why he is not stopping for 5 days before procedure.

## 2017-06-07 NOTE — Telephone Encounter (Signed)
Pt.notified

## 2017-06-14 NOTE — Pre-Procedure Instructions (Signed)
Luke Morgan  06/14/2017      [name] - [place], [place] - [name] [address] Phone: [phone] Fax: [phone]  [facility] - [place], [place] - [address] [name] [address] Phone: [phone] Fax: [phone]    Your procedure is scheduled on January 30  Report to [facility] Admitting at 0530 A.M.  Call this number if you have problems the morning of surgery:  [phone]   Remember:  Do not eat food or drink liquids after midnight.  Continue all medications as directed by your physician except follow these medication instructions before surgery below   Take these medicines the morning of surgery with A SIP OF WATER  acetaminophen (TYLENOL)  Fluticasone-Salmeterol (ADVAIR) gabapentin (NEURONTIN)  hydrALAZINE (APRESOLINE) metoprolol succinate (TOPROL-XL)   7 days prior to surgery STOP taking any Aspirin(unless otherwise instructed by your surgeon), Aleve, Naproxen, Ibuprofen, Motrin, Advil, Goody's, BC's, all herbal medications, fish oil, and all vitamins  FOLLOW PHYSICIANS INSTRUCTIONS ABOUT apixaban (ELIQUIS   WHAT DO I DO ABOUT MY DIABETES MEDICATION?   Marland Kitchen Do not take oral diabetes medicines (pills) the morning of surgery. metFORMIN (GLUCOPHAGE)    How to Manage Your Diabetes Before and After Surgery  Why is it important to control my blood sugar before and after surgery? . Improving blood sugar levels before and after surgery helps healing and can limit problems. . A way of improving blood sugar control is eating a healthy diet by: o  Eating less sugar and carbohydrates o  Increasing activity/exercise o  Talking with your doctor about reaching your blood sugar goals . High blood sugars (greater than 180 mg/dL) can raise your risk of infections and slow your recovery, so you will need to focus on controlling your diabetes  during the weeks before surgery. . Make sure that the doctor who takes care of your diabetes knows about your planned surgery including the date and location.  How do I manage my blood sugar before surgery? . Check your blood sugar at least 4 times a day, starting 2 days before surgery, to make sure that the level is not too high or low. o Check your blood sugar the morning of your surgery when you wake up and every 2 hours until you get to the Short Stay unit. . If your blood sugar is less than 70 mg/dL, you will need to treat for low blood sugar: o Do not take insulin. o Treat a low blood sugar (less than 70 mg/dL) with  cup of clear juice (cranberry or apple), 4 glucose tablets, OR glucose gel. o Recheck blood sugar in 15 minutes after treatment (to make sure it is greater than 70 mg/dL). If your blood sugar is not greater than 70 mg/dL on recheck, call [phone] for further instructions. . Report your blood sugar to the short stay nurse when you get to Short Stay.  . If you are admitted to the hospital after surgery: o Your blood sugar will be checked by the staff and you will probably be given insulin after surgery (instead of oral diabetes medicines) to make sure you have good blood sugar levels. o The goal for blood sugar control after surgery is 80-180 mg/dL.    Do not wear jewelry  Do not wear lotions, powders, or cologne, or deodorant.  Men may shave face and neck.  Do not bring valuables to the hospital.  [facility] is not responsible for any belongings or valuables.  Contacts, dentures or bridgework may not be worn into surgery.  Leave your suitcase in the car.  After surgery it may be brought to your room.  For patients admitted to the hospital, discharge time will be determined by your treatment team.  Patients discharged the day of surgery will not be allowed to drive home.    Special instructions:   [name]- Preparing For Surgery  Before surgery, you can play  an important role. Because skin is not sterile, your skin needs to be as free of germs as possible. You can reduce the number of germs on your skin by washing with CHG (chlorahexidine gluconate) Soap before surgery.  CHG is an antiseptic cleaner which kills germs and bonds with the skin to continue killing germs even after washing.  Please do not use if you have an allergy to CHG or antibacterial soaps. If your skin becomes reddened/irritated stop using the CHG.  Do not shave (including legs and underarms) for at least 48 hours prior to first CHG shower. It is OK to shave your face.  Please follow these instructions carefully.   1. Shower the NIGHT BEFORE SURGERY and the MORNING OF SURGERY with CHG.   2. If you chose to wash your hair, wash your hair first as usual with your normal shampoo.  3. After you shampoo, rinse your hair and body thoroughly to remove the shampoo.  4. Use CHG as you would any other liquid soap. You can apply CHG directly to the skin and wash gently with a scrungie or a clean washcloth.   5. Apply the CHG Soap to your body ONLY FROM THE NECK DOWN.  Do not use on open wounds or open sores. Avoid contact with your eyes, ears, mouth and genitals (private parts). Wash Face and genitals (private parts)  with your normal soap.  6. Wash thoroughly, paying special attention to the area where your surgery will be performed.  7. Thoroughly rinse your body with warm water from the neck down.  8. DO NOT shower/wash with your normal soap after using and rinsing off the CHG Soap.  9. Pat yourself dry with a CLEAN TOWEL.  10. Wear CLEAN PAJAMAS to bed the night before surgery, wear comfortable clothes the morning of surgery  11. Place CLEAN SHEETS on your bed the night of your first shower and DO NOT SLEEP WITH PETS.    Day of Surgery: Do not apply any deodorants/lotions. Please wear clean clothes to the hospital/surgery center.      Please read over the following fact  sheets that you were given.

## 2017-06-15 ENCOUNTER — Encounter (HOSPITAL_COMMUNITY): Payer: Self-pay

## 2017-06-15 ENCOUNTER — Encounter (HOSPITAL_COMMUNITY)
Admission: RE | Admit: 2017-06-15 | Discharge: 2017-06-15 | Disposition: A | Discharge status: Home / Self Care | Payer: Medicare Other | Source: Ambulatory Visit | Attending: Neurosurgery | Admitting: Neurosurgery

## 2017-06-15 ENCOUNTER — Other Ambulatory Visit: Payer: Self-pay

## 2017-06-15 DIAGNOSIS — Z01812 Encounter for preprocedural laboratory examination: Secondary | ICD-10-CM | POA: Insufficient documentation

## 2017-06-15 HISTORY — DX: Prediabetes: R73.03

## 2017-06-15 HISTORY — DX: Personal history of urinary calculi: Z87.442

## 2017-06-15 LAB — BASIC METABOLIC PANEL
ANION GAP: 12 (ref 5–15)
BUN: 20 mg/dL (ref 6–20)
CALCIUM: 9.3 mg/dL (ref 8.9–10.3)
CHLORIDE: 103 mmol/L (ref 101–111)
CO2: 25 mmol/L (ref 22–32)
CREATININE: 1.14 mg/dL (ref 0.61–1.24)
GFR calc Af Amer: >60 mL/min (ref >60)
GFR calc non Af Amer: >60 mL/min (ref >60)
GLUCOSE: 132 mg/dL — AB (ref 65–99)
Potassium: 4.1 mmol/L (ref 3.5–5.1)
Sodium: 140 mmol/L (ref 135–145)

## 2017-06-15 LAB — ABO/RH: ABO/RH(D): O POS

## 2017-06-15 LAB — CBC
HCT: 48.6 % (ref 39.0–52.0)
Hemoglobin: 15.8 g/dL (ref 13.0–17.0)
MCH: 28.9 pg (ref 26.0–34.0)
MCHC: 32.5 g/dL (ref 30.0–36.0)
MCV: 89 fL (ref 78.0–100.0)
PLATELETS: 239 10*3/uL (ref 150–400)
RBC: 5.46 MIL/uL (ref 4.22–5.81)
RDW: 15.2 % (ref 11.5–15.5)
WBC: 7.8 10*3/uL (ref 4.0–10.5)

## 2017-06-15 LAB — SURGICAL PCR SCREEN
MRSA, PCR: NEGATIVE
Staphylococcus aureus: NEGATIVE

## 2017-06-15 LAB — HEMOGLOBIN A1C
HEMOGLOBIN A1C: 5.7 % — AB (ref 4.8–5.6)
Mean Plasma Glucose: 116.89 mg/dL

## 2017-06-15 LAB — TYPE AND SCREEN
ABO/RH(D): O POS
Antibody Screen: NEGATIVE

## 2017-06-15 NOTE — Progress Notes (Signed)
PCP - Barbette Reichmann Cardiologist - Klein  Chest x-ray - not needed EKG - 05/05/17 Stress Test - denies ECHO - deneis Cardiac Cath - denies  Sleep Study - 2018 CPAP - wears at night - instructed to bring make and tubing the morning of srugery  pateint is considered pre-diabetic and is not currently checking sugars at home  Blood Thinner Instructions: instructed to stop 1/26 Aspirin Instructions:  Anesthesia review:  YES  Patient denies shortness of breath, fever, cough and chest pain at PAT appointment   Patient verbalized understanding of instructions that were given to them at the PAT appointment. Patient was also instructed that they will need to review over the PAT instructions again at home before surgery.

## 2017-06-16 NOTE — Progress Notes (Signed)
Anesthesia Chart Review: Patient is a 67 year old male scheduled for PLIF L4-5 on 06/22/17 by Dr.Jeffrey Jenkins.  History includes afib (persistent; s/p DCCV 12/24/16, 01/04/17, 04/10/17), HTN, asthma, osteoarthritis, OSA (CPAP), DM2, nephrolithiasis. Dr. Odessa Fleming note also lists presumably non-ischemic cardiomyopathy (EF 43% 06/2016, in setting of afib). BMI is consistent with morbid obesity.   - PCP is Dr. Barbette Reichmann. - Primary cardiologist is Dr. Corky Downs [phone]). - EP Cardiologist is Dr. Sherryl Manges. Last visit 05/05/17. Also seen in the [facility] by Rudi Coco, NP on 04/08/17. Per 05/31/17 telephone encounter by Tereso Newcomer, PA-C, "The patient was recently seen by Dr. Graciela Husbands 05/05/17.  I reviewed his case with Dr. Graciela Husbands today. Based upon the patient's past medical history and time since last visit, and according to ACC/AHA guidelines, SAFAL KASTER III would be at acceptable risk for the planned procedure without further cardiovascular testing. I will forward note to our anticoagulation clinic to address recommendations regarding his Apixaban." Per pharmacy, he was instructed to hold Eliquis for three days prior to surgery (without Lovenox bridge).   Meds include allopurinol, Eliquis (last dose 06/18/17), Advair, Lasix, Neurontin, hydralazine, losartan, metformin, Toprol XL, KCl, Aldactone.   BP 139/74   Pulse 88   Temp 36.7 C   Resp 20   Ht 6\' 5"  (1.956 m)   Wt (!) 392 lb 11.2 oz (178.1 kg)   SpO2 97%   BMI 46.57 kg/m   EKG 05/05/17: Afib at 100 bpm, low voltage QRS, prolonged QT.   Echo 06/29/16 (Dr. Juel Burrow; scanned under Media tab, Correspondence 06/29/16):  Normal left and right ventricular dimensions and wall thickness.  Global left and right ventricular function within normal limits.  LVEF estimated at 43%.  Right and left atria normal.  No evidence of pericardial effusion.  Valves are normal for age.  Conclusions: No significant valvular disease seen.  Decreased  LV function, no blood clots seen in LV or LAD.  Cardiac dysrhythmias.  Dr. Fredna Dow staff confirmed that there is not a nuclear stress test on this patient.   Preoperative labs noted. Glucose 132. A1c 5.7. Cr 1.14. CBC WNL.   Cardiology has cleared patient for surgery without additional preoperative testing. Eliquis to be held for three days prior to surgery. HR was 88 at PAT. He denied SOB, chest pain, cough, fever at PAT. He was instructed to bring CPAP mask on the day of surgery. If no acute changes then I would anticipate that he can proceed as planned.  Velna Ochs [facility]/Anesthesiology Phone [phone] 06/16/2017 1:43 PM

## 2017-06-21 MED ORDER — VANCOMYCIN HCL 10 G IV SOLR
1500.0000 mg | INTRAVENOUS | Status: AC
  Administered 2017-06-22: 1500 mg via INTRAVENOUS
  Filled 2017-06-21: qty 1500

## 2017-06-21 NOTE — Anesthesia Preprocedure Evaluation (Addendum)
Anesthesia Evaluation  Patient identified by MRN, date of birth, ID band Patient awake    Reviewed: Allergy & Precautions, NPO status , Patient's Chart, lab work & pertinent test results  History of Anesthesia Complications Negative for: history of anesthetic complications  Airway Mallampati: III  TM Distance: >3 FB Neck ROM: Full    Dental no notable dental hx. (+) Dental Advisory Given   Pulmonary asthma , sleep apnea and Continuous Positive Airway Pressure Ventilation ,    Pulmonary exam normal        Cardiovascular hypertension, + dysrhythmias Atrial Fibrillation  Rhythm:Irregular Rate:Tachycardia  8/16 ECHO: ejection fraction of 43%, Wall thickness normal    Neuro/Psych negative neurological ROS  negative psych ROS   GI/Hepatic negative GI ROS,   Endo/Other  Morbid obesity  Renal/GU      Musculoskeletal   Abdominal   Peds  Hematology   Anesthesia Other Findings   Reproductive/Obstetrics                           Anesthesia Physical  Anesthesia Plan  ASA: III  Anesthesia Plan: General   Post-op Pain Management:    Induction: Intravenous  PONV Risk Score and Plan: 2 and 3 and Ondansetron, Dexamethasone and Scopolamine patch - Pre-op  Airway Management Planned: Oral ETT  Additional Equipment:   Intra-op Plan:   Post-operative Plan: Extubation in OR  Informed Consent: I have reviewed the patients History and Physical, chart, labs and discussed the procedure including the risks, benefits and alternatives for the proposed anesthesia with the patient or authorized representative who has indicated his/her understanding and acceptance.   Dental advisory given  Plan Discussed with: CRNA and Anesthesiologist  Anesthesia Plan Comments:        Anesthesia Quick Evaluation

## 2017-06-22 ENCOUNTER — Inpatient Hospital Stay (HOSPITAL_COMMUNITY): Payer: Medicare Other

## 2017-06-22 ENCOUNTER — Inpatient Hospital Stay (HOSPITAL_COMMUNITY): Payer: Medicare Other | Admitting: Anesthesiology

## 2017-06-22 ENCOUNTER — Other Ambulatory Visit: Payer: Self-pay

## 2017-06-22 ENCOUNTER — Inpatient Hospital Stay (HOSPITAL_COMMUNITY): Payer: Medicare Other | Admitting: Vascular Surgery

## 2017-06-22 ENCOUNTER — Encounter (HOSPITAL_COMMUNITY)
Admission: RE | Disposition: A | Discharge status: Home / Self Care | Payer: Self-pay | Source: Ambulatory Visit | Attending: Neurosurgery

## 2017-06-22 ENCOUNTER — Encounter: Payer: Self-pay | Admitting: Internal Medicine

## 2017-06-22 ENCOUNTER — Encounter (HOSPITAL_COMMUNITY): Payer: Self-pay

## 2017-06-22 ENCOUNTER — Inpatient Hospital Stay (HOSPITAL_COMMUNITY)
Admission: RE | Admit: 2017-06-22 | Discharge: 2017-06-23 | DRG: 454 | Disposition: A | Discharge status: Home / Self Care | Payer: Medicare Other | Source: Ambulatory Visit | Attending: Neurosurgery | Admitting: Neurosurgery

## 2017-06-22 DIAGNOSIS — Z7951 Long term (current) use of inhaled steroids: Secondary | ICD-10-CM

## 2017-06-22 DIAGNOSIS — M5416 Radiculopathy, lumbar region: Secondary | ICD-10-CM | POA: Diagnosis present

## 2017-06-22 DIAGNOSIS — I10 Essential (primary) hypertension: Secondary | ICD-10-CM | POA: Diagnosis present

## 2017-06-22 DIAGNOSIS — G4730 Sleep apnea, unspecified: Secondary | ICD-10-CM | POA: Diagnosis present

## 2017-06-22 DIAGNOSIS — Z6841 Body Mass Index (BMI) 40.0 and over, adult: Secondary | ICD-10-CM | POA: Diagnosis not present

## 2017-06-22 DIAGNOSIS — Z7984 Long term (current) use of oral hypoglycemic drugs: Secondary | ICD-10-CM | POA: Diagnosis not present

## 2017-06-22 DIAGNOSIS — I4891 Unspecified atrial fibrillation: Secondary | ICD-10-CM | POA: Diagnosis present

## 2017-06-22 DIAGNOSIS — Z79899 Other long term (current) drug therapy: Secondary | ICD-10-CM

## 2017-06-22 DIAGNOSIS — J45909 Unspecified asthma, uncomplicated: Secondary | ICD-10-CM | POA: Diagnosis present

## 2017-06-22 DIAGNOSIS — M48062 Spinal stenosis, lumbar region with neurogenic claudication: Secondary | ICD-10-CM | POA: Diagnosis present

## 2017-06-22 DIAGNOSIS — Z419 Encounter for procedure for purposes other than remedying health state, unspecified: Secondary | ICD-10-CM

## 2017-06-22 DIAGNOSIS — Z880 Allergy status to penicillin: Secondary | ICD-10-CM | POA: Diagnosis not present

## 2017-06-22 DIAGNOSIS — M4316 Spondylolisthesis, lumbar region: Secondary | ICD-10-CM | POA: Diagnosis present

## 2017-06-22 DIAGNOSIS — Z7901 Long term (current) use of anticoagulants: Secondary | ICD-10-CM

## 2017-06-22 HISTORY — DX: Spondylolisthesis, lumbar region: M43.16

## 2017-06-22 LAB — PROTIME-INR
INR: 1
PROTHROMBIN TIME: 13.1 s (ref 11.4–15.2)

## 2017-06-22 LAB — GLUCOSE, CAPILLARY
GLUCOSE-CAPILLARY: 112 mg/dL — AB (ref 65–99)
GLUCOSE-CAPILLARY: 133 mg/dL — AB (ref 65–99)
GLUCOSE-CAPILLARY: 169 mg/dL — AB (ref 65–99)
Glucose-Capillary: 99 mg/dL (ref 65–99)

## 2017-06-22 SURGERY — POSTERIOR LUMBAR FUSION 1 LEVEL
Anesthesia: General | Site: Back

## 2017-06-22 MED ORDER — SCOPOLAMINE 1 MG/3DAYS TD PT72
1.0000 | MEDICATED_PATCH | TRANSDERMAL | Status: DC
  Administered 2017-06-22: 1.5 mg via TRANSDERMAL
  Filled 2017-06-22: qty 1

## 2017-06-22 MED ORDER — BACITRACIN ZINC 500 UNIT/GM EX OINT
TOPICAL_OINTMENT | CUTANEOUS | Status: DC | PRN
  Administered 2017-06-22: 1 via TOPICAL

## 2017-06-22 MED ORDER — BACITRACIN ZINC 500 UNIT/GM EX OINT
TOPICAL_OINTMENT | CUTANEOUS | Status: AC
  Filled 2017-06-22: qty 28.35

## 2017-06-22 MED ORDER — THROMBIN 5000 UNITS EX SOLR
CUTANEOUS | Status: AC
  Filled 2017-06-22: qty 10000

## 2017-06-22 MED ORDER — VANCOMYCIN HCL 1000 MG IV SOLR
INTRAVENOUS | Status: AC
  Filled 2017-06-22: qty 1000

## 2017-06-22 MED ORDER — DOCUSATE SODIUM 100 MG PO CAPS
100.0000 mg | ORAL_CAPSULE | Freq: Two times a day (BID) | ORAL | Status: DC
  Administered 2017-06-22 – 2017-06-23 (×3): 100 mg via ORAL
  Filled 2017-06-22 (×3): qty 1

## 2017-06-22 MED ORDER — MIDAZOLAM HCL 2 MG/2ML IJ SOLN
INTRAMUSCULAR | Status: AC
  Filled 2017-06-22: qty 2

## 2017-06-22 MED ORDER — ONDANSETRON HCL 4 MG/2ML IJ SOLN: 4.0000 mg | Freq: Four times a day (QID) | INTRAMUSCULAR | Status: DC | PRN

## 2017-06-22 MED ORDER — PHENYLEPHRINE HCL 10 MG/ML IJ SOLN
INTRAMUSCULAR | Status: DC | PRN
  Administered 2017-06-22 (×5): 120 ug via INTRAVENOUS

## 2017-06-22 MED ORDER — GELATIN ABSORBABLE MT POWD
OROMUCOSAL | Status: DC | PRN
  Administered 2017-06-22: 09:00:00 via TOPICAL

## 2017-06-22 MED ORDER — HYDROMORPHONE HCL 1 MG/ML IJ SOLN: 0.2500 mg | INTRAMUSCULAR | Status: DC | PRN

## 2017-06-22 MED ORDER — FENTANYL CITRATE (PF) 250 MCG/5ML IJ SOLN
INTRAMUSCULAR | Status: AC
  Filled 2017-06-22: qty 5

## 2017-06-22 MED ORDER — INSULIN ASPART 100 UNIT/ML ~~LOC~~ SOLN
0.0000 [IU] | Freq: Three times a day (TID) | SUBCUTANEOUS | Status: DC
  Administered 2017-06-22: 4 [IU] via SUBCUTANEOUS

## 2017-06-22 MED ORDER — METFORMIN HCL 500 MG PO TABS
500.0000 mg | ORAL_TABLET | Freq: Every day | ORAL | Status: DC
  Administered 2017-06-23: 500 mg via ORAL
  Filled 2017-06-22: qty 1

## 2017-06-22 MED ORDER — FUROSEMIDE 40 MG PO TABS
40.0000 mg | ORAL_TABLET | ORAL | Status: DC
  Administered 2017-06-23: 40 mg via ORAL
  Filled 2017-06-22: qty 1

## 2017-06-22 MED ORDER — ALBUMIN HUMAN 5 % IV SOLN
INTRAVENOUS | Status: DC | PRN
  Administered 2017-06-22: 10:00:00 via INTRAVENOUS

## 2017-06-22 MED ORDER — ACETAMINOPHEN 10 MG/ML IV SOLN
INTRAVENOUS | Status: AC
  Filled 2017-06-22: qty 100

## 2017-06-22 MED ORDER — CHLORHEXIDINE GLUCONATE CLOTH 2 % EX PADS: 6.0000 | MEDICATED_PAD | Freq: Once | CUTANEOUS | Status: DC

## 2017-06-22 MED ORDER — BISACODYL 10 MG RE SUPP: 10.0000 mg | Freq: Every day | RECTAL | Status: DC | PRN

## 2017-06-22 MED ORDER — PROPOFOL 10 MG/ML IV BOLUS
INTRAVENOUS | Status: AC
  Filled 2017-06-22: qty 20

## 2017-06-22 MED ORDER — OXYCODONE HCL 5 MG PO TABS
10.0000 mg | ORAL_TABLET | ORAL | Status: DC | PRN
  Administered 2017-06-22 – 2017-06-23 (×5): 10 mg via ORAL
  Filled 2017-06-22 (×5): qty 2

## 2017-06-22 MED ORDER — SODIUM CHLORIDE 0.9% FLUSH
3.0000 mL | Freq: Two times a day (BID) | INTRAVENOUS | Status: DC
  Administered 2017-06-22: 3 mL via INTRAVENOUS

## 2017-06-22 MED ORDER — GABAPENTIN 300 MG PO CAPS
300.0000 mg | ORAL_CAPSULE | Freq: Three times a day (TID) | ORAL | Status: DC
  Administered 2017-06-22 – 2017-06-23 (×3): 300 mg via ORAL
  Filled 2017-06-22 (×3): qty 1

## 2017-06-22 MED ORDER — SODIUM CHLORIDE 0.9% FLUSH: 3.0000 mL | INTRAVENOUS | Status: DC | PRN

## 2017-06-22 MED ORDER — ALLOPURINOL 300 MG PO TABS
300.0000 mg | ORAL_TABLET | Freq: Every evening | ORAL | Status: DC
  Administered 2017-06-22: 300 mg via ORAL
  Filled 2017-06-22 (×2): qty 1

## 2017-06-22 MED ORDER — PHENYLEPHRINE HCL 10 MG/ML IJ SOLN
INTRAVENOUS | Status: DC | PRN
  Administered 2017-06-22: 50 ug/min via INTRAVENOUS
  Administered 2017-06-22: 11:00:00 via INTRAVENOUS

## 2017-06-22 MED ORDER — FENTANYL CITRATE (PF) 100 MCG/2ML IJ SOLN
INTRAMUSCULAR | Status: DC | PRN
  Administered 2017-06-22 (×5): 50 ug via INTRAVENOUS

## 2017-06-22 MED ORDER — LIDOCAINE HCL (CARDIAC) 20 MG/ML IV SOLN
INTRAVENOUS | Status: DC | PRN
  Administered 2017-06-22: 100 mg via INTRAVENOUS

## 2017-06-22 MED ORDER — INSULIN ASPART 100 UNIT/ML ~~LOC~~ SOLN
0.0000 [IU] | SUBCUTANEOUS | Status: DC
  Administered 2017-06-22: 3 [IU] via SUBCUTANEOUS

## 2017-06-22 MED ORDER — METOPROLOL SUCCINATE ER 100 MG PO TB24
100.0000 mg | ORAL_TABLET | Freq: Every day | ORAL | Status: DC
  Administered 2017-06-23: 100 mg via ORAL
  Filled 2017-06-22: qty 1

## 2017-06-22 MED ORDER — 0.9 % SODIUM CHLORIDE (POUR BTL) OPTIME
TOPICAL | Status: DC | PRN
  Administered 2017-06-22: 1000 mL

## 2017-06-22 MED ORDER — POTASSIUM CHLORIDE ER 10 MEQ PO TBCR
10.0000 meq | EXTENDED_RELEASE_TABLET | ORAL | Status: DC
  Administered 2017-06-23: 10 meq via ORAL
  Filled 2017-06-22: qty 1

## 2017-06-22 MED ORDER — ACETAMINOPHEN 500 MG PO TABS
1000.0000 mg | ORAL_TABLET | Freq: Four times a day (QID) | ORAL | Status: AC
  Administered 2017-06-22 – 2017-06-23 (×3): 1000 mg via ORAL
  Filled 2017-06-22 (×3): qty 2

## 2017-06-22 MED ORDER — SODIUM CHLORIDE 0.9 % IV SOLN: 250.0000 mL | INTRAVENOUS | Status: DC

## 2017-06-22 MED ORDER — LACTATED RINGERS IV SOLN
INTRAVENOUS | Status: DC | PRN
  Administered 2017-06-22 (×2): via INTRAVENOUS

## 2017-06-22 MED ORDER — PHENYLEPHRINE 40 MCG/ML (10ML) SYRINGE FOR IV PUSH (FOR BLOOD PRESSURE SUPPORT)
PREFILLED_SYRINGE | INTRAVENOUS | Status: AC
  Filled 2017-06-22: qty 10

## 2017-06-22 MED ORDER — SODIUM CHLORIDE 0.9 % IR SOLN
Status: DC | PRN
  Administered 2017-06-22: 09:00:00

## 2017-06-22 MED ORDER — VANCOMYCIN HCL 10 G IV SOLR
1500.0000 mg | Freq: Once | INTRAVENOUS | Status: AC
  Administered 2017-06-22: 1500 mg via INTRAVENOUS
  Filled 2017-06-22: qty 1500

## 2017-06-22 MED ORDER — MENTHOL 3 MG MT LOZG: 1.0000 | LOZENGE | OROMUCOSAL | Status: DC | PRN

## 2017-06-22 MED ORDER — PHENOL 1.4 % MT LIQD: 1.0000 | OROMUCOSAL | Status: DC | PRN

## 2017-06-22 MED ORDER — ONDANSETRON HCL 4 MG/2ML IJ SOLN
INTRAMUSCULAR | Status: DC | PRN
  Administered 2017-06-22: 4 mg via INTRAVENOUS

## 2017-06-22 MED ORDER — BUPIVACAINE-EPINEPHRINE (PF) 0.5% -1:200000 IJ SOLN
INTRAMUSCULAR | Status: AC
  Filled 2017-06-22: qty 30

## 2017-06-22 MED ORDER — ONDANSETRON HCL 4 MG PO TABS: 4.0000 mg | ORAL_TABLET | Freq: Four times a day (QID) | ORAL | Status: DC | PRN

## 2017-06-22 MED ORDER — SUCCINYLCHOLINE CHLORIDE 20 MG/ML IJ SOLN
INTRAMUSCULAR | Status: DC | PRN
  Administered 2017-06-22: 130 mg via INTRAVENOUS

## 2017-06-22 MED ORDER — THROMBIN 20000 UNITS EX SOLR
CUTANEOUS | Status: AC
  Filled 2017-06-22: qty 20000

## 2017-06-22 MED ORDER — OXYCODONE HCL 5 MG PO TABS
5.0000 mg | ORAL_TABLET | ORAL | Status: DC | PRN
  Administered 2017-06-23: 5 mg via ORAL
  Filled 2017-06-22: qty 1

## 2017-06-22 MED ORDER — DEXAMETHASONE SODIUM PHOSPHATE 10 MG/ML IJ SOLN
INTRAMUSCULAR | Status: AC
  Filled 2017-06-22: qty 1

## 2017-06-22 MED ORDER — DEXAMETHASONE SODIUM PHOSPHATE 10 MG/ML IJ SOLN
INTRAMUSCULAR | Status: DC | PRN
  Administered 2017-06-22: 5 mg via INTRAVENOUS

## 2017-06-22 MED ORDER — PROPOFOL 10 MG/ML IV BOLUS
INTRAVENOUS | Status: DC | PRN
  Administered 2017-06-22: 200 mg via INTRAVENOUS

## 2017-06-22 MED ORDER — SPIRONOLACTONE 25 MG PO TABS
25.0000 mg | ORAL_TABLET | Freq: Every day | ORAL | Status: DC
  Administered 2017-06-22 – 2017-06-23 (×2): 25 mg via ORAL
  Filled 2017-06-22 (×2): qty 1

## 2017-06-22 MED ORDER — ROCURONIUM BROMIDE 10 MG/ML (PF) SYRINGE
PREFILLED_SYRINGE | INTRAVENOUS | Status: AC
  Filled 2017-06-22: qty 5

## 2017-06-22 MED ORDER — BUPIVACAINE LIPOSOME 1.3 % IJ SUSP
20.0000 mL | INTRAMUSCULAR | Status: DC
  Filled 2017-06-22: qty 20

## 2017-06-22 MED ORDER — ACETAMINOPHEN 10 MG/ML IV SOLN
INTRAVENOUS | Status: DC | PRN
  Administered 2017-06-22: 1000 mg via INTRAVENOUS

## 2017-06-22 MED ORDER — BUPIVACAINE LIPOSOME 1.3 % IJ SUSP
INTRAMUSCULAR | Status: DC | PRN
  Administered 2017-06-22: 20 mL

## 2017-06-22 MED ORDER — ONDANSETRON HCL 4 MG/2ML IJ SOLN
INTRAMUSCULAR | Status: AC
  Filled 2017-06-22: qty 2

## 2017-06-22 MED ORDER — CYCLOBENZAPRINE HCL 10 MG PO TABS
10.0000 mg | ORAL_TABLET | Freq: Three times a day (TID) | ORAL | Status: DC | PRN
  Administered 2017-06-22 – 2017-06-23 (×2): 10 mg via ORAL
  Filled 2017-06-22 (×2): qty 1

## 2017-06-22 MED ORDER — LOSARTAN POTASSIUM 50 MG PO TABS
100.0000 mg | ORAL_TABLET | Freq: Every day | ORAL | Status: DC
  Administered 2017-06-22 – 2017-06-23 (×2): 100 mg via ORAL
  Filled 2017-06-22 (×2): qty 2

## 2017-06-22 MED ORDER — SUGAMMADEX SODIUM 200 MG/2ML IV SOLN
INTRAVENOUS | Status: DC | PRN
  Administered 2017-06-22: 300 mg via INTRAVENOUS

## 2017-06-22 MED ORDER — HYDRALAZINE HCL 10 MG PO TABS
50.0000 mg | ORAL_TABLET | Freq: Three times a day (TID) | ORAL | Status: DC
  Administered 2017-06-22 – 2017-06-23 (×2): 50 mg via ORAL
  Filled 2017-06-22 (×3): qty 5

## 2017-06-22 MED ORDER — MIDAZOLAM HCL 5 MG/5ML IJ SOLN
INTRAMUSCULAR | Status: DC | PRN
  Administered 2017-06-22: 2 mg via INTRAVENOUS

## 2017-06-22 MED ORDER — BUPIVACAINE-EPINEPHRINE (PF) 0.5% -1:200000 IJ SOLN
INTRAMUSCULAR | Status: DC | PRN
  Administered 2017-06-22: 10 mL

## 2017-06-22 MED ORDER — VANCOMYCIN HCL 1000 MG IV SOLR
INTRAVENOUS | Status: DC | PRN
  Administered 2017-06-22: 1000 mg via TOPICAL

## 2017-06-22 MED ORDER — MOMETASONE FURO-FORMOTEROL FUM 100-5 MCG/ACT IN AERO
2.0000 | INHALATION_SPRAY | Freq: Two times a day (BID) | RESPIRATORY_TRACT | Status: DC
Start: 2017-06-22 — End: 2017-06-23
  Administered 2017-06-22 – 2017-06-23 (×2): 2 via RESPIRATORY_TRACT
  Filled 2017-06-22: qty 8.8

## 2017-06-22 MED ORDER — MORPHINE SULFATE (PF) 4 MG/ML IV SOLN: 4.0000 mg | INTRAVENOUS | Status: DC | PRN

## 2017-06-22 MED ORDER — PROMETHAZINE HCL 25 MG/ML IJ SOLN: 6.2500 mg | INTRAMUSCULAR | Status: DC | PRN

## 2017-06-22 MED ORDER — ROCURONIUM BROMIDE 100 MG/10ML IV SOLN
INTRAVENOUS | Status: DC | PRN
  Administered 2017-06-22: 40 mg via INTRAVENOUS
  Administered 2017-06-22: 60 mg via INTRAVENOUS
  Administered 2017-06-22: 20 mg via INTRAVENOUS
  Administered 2017-06-22: 25 mg via INTRAVENOUS

## 2017-06-22 SURGICAL SUPPLY — 70 items
BAG DECANTER FOR FLEXI CONT (MISCELLANEOUS) ×3 IMPLANT
BASKET BONE COLLECTION (BASKET) ×3 IMPLANT
BENZOIN TINCTURE PRP APPL 2/3 (GAUZE/BANDAGES/DRESSINGS) ×3 IMPLANT
BLADE CLIPPER SURG (BLADE) ×3 IMPLANT
BUR MATCHSTICK NEURO 3.0 LAGG (BURR) ×3 IMPLANT
BUR PRECISION FLUTE 6.0 (BURR) ×3 IMPLANT
CANISTER SUCT 3000ML PPV (MISCELLANEOUS) ×3 IMPLANT
CAP REVERE LOCKING (Cap) ×12 IMPLANT
CARTRIDGE OIL MAESTRO DRILL (MISCELLANEOUS) ×1 IMPLANT
CLOSURE WOUND 1/2 X4 (GAUZE/BANDAGES/DRESSINGS) ×1
CONT SPEC 4OZ CLIKSEAL STRL BL (MISCELLANEOUS) ×6 IMPLANT
COVER BACK TABLE 60X90IN (DRAPES) ×3 IMPLANT
DECANTER SPIKE VIAL GLASS SM (MISCELLANEOUS) ×3 IMPLANT
DIFFUSER DRILL AIR PNEUMATIC (MISCELLANEOUS) ×3 IMPLANT
DRAPE C-ARM 42X72 X-RAY (DRAPES) ×6 IMPLANT
DRAPE HALF SHEET 40X57 (DRAPES) ×3 IMPLANT
DRAPE LAPAROTOMY 100X72X124 (DRAPES) ×3 IMPLANT
DRAPE SURG 17X23 STRL (DRAPES) ×12 IMPLANT
ELECT BLADE 4.0 EZ CLEAN MEGAD (MISCELLANEOUS) ×3
ELECT REM PT RETURN 9FT ADLT (ELECTROSURGICAL) ×3
ELECTRODE BLDE 4.0 EZ CLN MEGD (MISCELLANEOUS) ×1 IMPLANT
ELECTRODE REM PT RTRN 9FT ADLT (ELECTROSURGICAL) ×1 IMPLANT
EVACUATOR 1/8 PVC DRAIN (DRAIN) IMPLANT
GAUZE SPONGE 4X4 12PLY STRL (GAUZE/BANDAGES/DRESSINGS) ×3 IMPLANT
GAUZE SPONGE 4X4 16PLY XRAY LF (GAUZE/BANDAGES/DRESSINGS) ×3 IMPLANT
GLOVE BIO SURGEON STRL SZ8 (GLOVE) ×9 IMPLANT
GLOVE BIO SURGEON STRL SZ8.5 (GLOVE) ×6 IMPLANT
GLOVE BIOGEL PI IND STRL 6.5 (GLOVE) ×3 IMPLANT
GLOVE BIOGEL PI IND STRL 7.5 (GLOVE) ×2 IMPLANT
GLOVE BIOGEL PI INDICATOR 6.5 (GLOVE) ×6
GLOVE BIOGEL PI INDICATOR 7.5 (GLOVE) ×4
GLOVE ECLIPSE 7.5 STRL STRAW (GLOVE) ×3 IMPLANT
GLOVE EXAM NITRILE LRG STRL (GLOVE) IMPLANT
GLOVE EXAM NITRILE XL STR (GLOVE) IMPLANT
GLOVE EXAM NITRILE XS STR PU (GLOVE) IMPLANT
GLOVE SURG SS PI 6.5 STRL IVOR (GLOVE) ×15 IMPLANT
GOWN STRL REUS W/ TWL LRG LVL3 (GOWN DISPOSABLE) ×2 IMPLANT
GOWN STRL REUS W/ TWL XL LVL3 (GOWN DISPOSABLE) ×4 IMPLANT
GOWN STRL REUS W/TWL 2XL LVL3 (GOWN DISPOSABLE) IMPLANT
GOWN STRL REUS W/TWL LRG LVL3 (GOWN DISPOSABLE) ×4
GOWN STRL REUS W/TWL XL LVL3 (GOWN DISPOSABLE) ×8
HEMOSTAT POWDER KIT SURGIFOAM (HEMOSTASIS) ×3 IMPLANT
KIT BASIN OR (CUSTOM PROCEDURE TRAY) ×3 IMPLANT
KIT ROOM TURNOVER OR (KITS) ×3 IMPLANT
MILL MEDIUM DISP (BLADE) IMPLANT
NEEDLE HYPO 21X1.5 SAFETY (NEEDLE) ×3 IMPLANT
NEEDLE HYPO 22GX1.5 SAFETY (NEEDLE) ×3 IMPLANT
NS IRRIG 1000ML POUR BTL (IV SOLUTION) ×3 IMPLANT
OIL CARTRIDGE MAESTRO DRILL (MISCELLANEOUS) ×3
PACK LAMINECTOMY NEURO (CUSTOM PROCEDURE TRAY) ×3 IMPLANT
PAD ARMBOARD 7.5X6 YLW CONV (MISCELLANEOUS) ×18 IMPLANT
PATTIES SURGICAL .5 X1 (DISPOSABLE) IMPLANT
PATTIES SURGICAL 1X1 (DISPOSABLE) ×3 IMPLANT
ROD CURVED REVERE 6.35X50MM (Rod) ×6 IMPLANT
SCREW REVERE 6.35 7.5X60MM (Screw) ×12 IMPLANT
SPACER ALTERA 10X31-15 (Spacer) ×3 IMPLANT
SPONGE LAP 4X18 X RAY DECT (DISPOSABLE) IMPLANT
SPONGE NEURO XRAY DETECT 1X3 (DISPOSABLE) IMPLANT
SPONGE SURGIFOAM ABS GEL 100 (HEMOSTASIS) IMPLANT
STRIP BIOACTIVE 20CC 25X100X8 (Miscellaneous) ×3 IMPLANT
STRIP CLOSURE SKIN 1/2X4 (GAUZE/BANDAGES/DRESSINGS) ×2 IMPLANT
SUT VIC AB 1 CT1 18XBRD ANBCTR (SUTURE) ×2 IMPLANT
SUT VIC AB 1 CT1 8-18 (SUTURE) ×4
SUT VIC AB 2-0 CP2 18 (SUTURE) ×6 IMPLANT
SYR 20CC LL (SYRINGE) ×3 IMPLANT
TAPE CLOTH SURG 4X10 WHT LF (GAUZE/BANDAGES/DRESSINGS) ×3 IMPLANT
TOWEL GREEN STERILE (TOWEL DISPOSABLE) ×3 IMPLANT
TOWEL GREEN STERILE FF (TOWEL DISPOSABLE) ×3 IMPLANT
TRAY FOLEY W/METER SILVER 16FR (SET/KITS/TRAYS/PACK) ×3 IMPLANT
WATER STERILE IRR 1000ML POUR (IV SOLUTION) ×3 IMPLANT

## 2017-06-22 NOTE — Anesthesia Procedure Notes (Signed)
Procedure Name: Intubation Date/Time: 06/22/2017 7:40 AM Performed by: Inda Coke, CRNA Pre-anesthesia Checklist: Patient identified, Emergency Drugs available, Suction available and Patient being monitored Patient Re-evaluated:Patient Re-evaluated prior to induction Oxygen Delivery Method: Circle System Utilized Preoxygenation: Pre-oxygenation with 100% oxygen Induction Type: IV induction Ventilation: Oral airway inserted - appropriate to patient size and Two handed mask ventilation required Laryngoscope Size: Mac and 4 Grade View: Grade I Tube type: Oral Tube size: 7.5 mm Number of attempts: 1 Airway Equipment and Method: Stylet and Oral airway Placement Confirmation: ETT inserted through vocal cords under direct vision,  positive ETCO2 and breath sounds checked- equal and bilateral Secured at: 23 cm Tube secured with: Tape Dental Injury: Teeth and Oropharynx as per pre-operative assessment  Future Recommendations: Recommend- induction with short-acting agent, and alternative techniques readily available

## 2017-06-22 NOTE — Progress Notes (Signed)
Patient ID: Luke Morgan, male   DOB: [birth year], 67 y.o.   MRN: [id number] Subjective: The patient is alert and pleasant.  He is in no apparent distress.  Objective: Vital signs in last 24 hours: Temp:  [97.5 F (36.4 C)-98.9 F (37.2 C)] 98.3 F (36.8 C) (01/30 1622) Pulse Rate:  [83-105] 95 (01/30 1622) Resp:  [11-20] 18 (01/30 1622) BP: (94-119)/(57-77) 100/57 (01/30 1622) SpO2:  [92 %-96 %] 93 % (01/30 1622) Weight:  [177.8 kg (392 lb)] 177.8 kg (392 lb) (01/30 0620) Estimated body mass index is 46.48 kg/m as calculated from the following:   Height as of this encounter: 6\' 5"  (1.956 m).   Weight as of this encounter: 177.8 kg (392 lb).   Intake/Output from previous day: No intake/output data recorded. Intake/Output this shift: Total I/O In: 2860 [P.O.:510; I.V.:2100; IV Piggyback:250] Out: 435 [Urine:235; Blood:200]  Physical exam the patient is alert and pleasant.  He is moving his lower extremities well.  Lab Results: No results for input(s): WBC, HGB, HCT, PLT in the last 72 hours. BMET No results for input(s): NA, K, CL, CO2, GLUCOSE, BUN, CREATININE, CALCIUM in the last 72 hours.  Studies/Results: Dg Lumbar Spine 2-3 Views  Result Date: 06/22/2017 CLINICAL DATA:  Surgical posterior fusion of L4-5. EXAM: DG C-ARM 61-120 MIN; LUMBAR SPINE - 2-3 VIEW FLUOROSCOPY TIME:  58 seconds. COMPARISON:  Radiograph of same day. FINDINGS: Status post surgical posterior fusion of L4-5 with bilateral intrapedicular screw placement and interbody fusion. Good alignment of vertebral bodies is noted. IMPRESSION: Status post surgical posterior fusion of L4-5. Electronically Signed   By: Lupita Raider, M.D.   On: 06/22/2017 12:40   Dg Lumbar Spine 1 View  Result Date: 06/22/2017 CLINICAL DATA:  L4-5 fixation. EXAM: LUMBAR SPINE - 1 VIEW COMPARISON:  05/20/2017 MRI. FINDINGS: Single lateral view labeled 0813 hours. This demonstrates a surgical device projecting posterior to the  L3-4 interspace. Loss of intervertebral disc height at L4-5 and L5-S1. IMPRESSION: Intraoperative localization of L3-4. Electronically Signed   By: Jeronimo Greaves M.D.   On: 06/22/2017 10:26   Dg C-arm 1-60 Min  Result Date: 06/22/2017 CLINICAL DATA:  Surgical posterior fusion of L4-5. EXAM: DG C-ARM 61-120 MIN; LUMBAR SPINE - 2-3 VIEW FLUOROSCOPY TIME:  58 seconds. COMPARISON:  Radiograph of same day. FINDINGS: Status post surgical posterior fusion of L4-5 with bilateral intrapedicular screw placement and interbody fusion. Good alignment of vertebral bodies is noted. IMPRESSION: Status post surgical posterior fusion of L4-5. Electronically Signed   By: Lupita Raider, M.D.   On: 06/22/2017 12:40    Assessment/Plan: The patient is doing well.  I spoke with his wife.  LOS: 0 days     Cristi Loron 06/22/2017, 5:03 PM

## 2017-06-22 NOTE — Progress Notes (Signed)
Orthopedic Tech Progress Note Patient Details:  [name] 02-08-1951 [id number]  Patient ID: [name], male   DOB: Oct 22, 1950, 67 y.o.   MRN: [id number]   [name] 06/22/2017, 2:06 PM Called in bio-tech brace order; spoke with Cordelia Pen

## 2017-06-22 NOTE — Evaluation (Signed)
Physical Therapy Evaluation Patient Details Name: Luke Morgan MRN: [id number] DOB: Jul 07, 1950 Today's Date: 06/22/2017   History of Present Illness  Pt is a 67 y/o male s/p L4-5 PLIF. PMH includes a fib, HTN, pre diabetes, OSA on CPAP, and s/p cardioversion.   Clinical Impression  Patient is s/p above surgery resulting in the deficits listed below (see PT Problem List). Pt unsteady requiring min A throughout gait this session. Otherwise tolerated ambulation well. Educated about back precautions and mobility at home. Patient will benefit from skilled PT to increase their independence and safety with mobility (while adhering to their precautions) to allow discharge to the venue listed below.     Follow Up Recommendations No PT follow up;Supervision for mobility/OOB    Equipment Recommendations  Rolling walker with 5" wheels(bariatric )    Recommendations for Other Services OT consult     Precautions / Restrictions Precautions Precautions: Back Precaution Booklet Issued: Yes (comment) Precaution Comments: REviewed back precautions with pt.  Required Braces or Orthoses: Spinal Brace Spinal Brace: Lumbar corset;Applied in sitting position Restrictions Weight Bearing Restrictions: No      Mobility  Bed Mobility Overal bed mobility: Needs Assistance Bed Mobility: Sit to Sidelying         Sit to sidelying: Min assist General bed mobility comments: Min A for LE lift assist for return to sidelying. Educated about use of log roll technique at home.   Transfers Overall transfer level: Needs assistance Equipment used: Straight cane Transfers: Sit to/from Stand Sit to Stand: Min assist         General transfer comment: Min A for lift assist and steadying.   Ambulation/Gait Ambulation/Gait assistance: Min assist Ambulation Distance (Feet): 200 Feet Assistive device: 1 person hand held assist;Straight cane Gait Pattern/deviations: Step-through pattern;Decreased  stride length Gait velocity: Decreased Gait velocity interpretation: Below normal speed for age/gender General Gait Details: Slow unsteady gait, requiring gross min A throughout for steadying. Pt holding onto PT in addition to using cane for stability. Educated about use of RW at home to increase stability and pt agreeable. Educated about generalized walking program to perform at home.   Stairs            Wheelchair Mobility    Modified Rankin (Stroke Patients Only)       Balance Overall balance assessment: Needs assistance Sitting-balance support: No upper extremity supported;Feet supported Sitting balance-Leahy Scale: Good     Standing balance support: Bilateral upper extremity supported;During functional activity Standing balance-Leahy Scale: Poor Standing balance comment: Reliant on BUE support.                              Pertinent Vitals/Pain Pain Assessment: 0-10 Pain Score: 2  Pain Location: back  Pain Descriptors / Indicators: Sore Pain Intervention(s): Limited activity within patient's tolerance;Monitored during session;Repositioned    Home Living Family/patient expects to be discharged to:: Private residence Living Arrangements: Spouse/significant other Available Help at Discharge: Family;Available 24 hours/day Type of Home: House Home Access: Stairs to enter Entrance Stairs-Rails: Doctor, general practice of Steps: 7 Home Layout: Two level;Able to live on main level with bedroom/bathroom Home Equipment: Cane - single point;Grab bars - tub/shower      Prior Function Level of Independence: Independent with assistive device(s)         Comments: Used cane for ambulation      Hand Dominance        Extremity/Trunk Assessment  Upper Extremity Assessment Upper Extremity Assessment: Defer to OT evaluation    Lower Extremity Assessment Lower Extremity Assessment: Generalized weakness(reports improvement in numbness in BLE  )    Cervical / Trunk Assessment Cervical / Trunk Assessment: Other exceptions Cervical / Trunk Exceptions: s/p PLIF   Communication   Communication: No difficulties  Cognition Arousal/Alertness: Awake/alert Behavior During Therapy: WFL for tasks assessed/performed Overall Cognitive Status: Within Functional Limits for tasks assessed                                        General Comments      Exercises     Assessment/Plan    PT Assessment Patient needs continued PT services  PT Problem List Decreased strength;Decreased balance;Decreased mobility;Decreased knowledge of use of DME;Decreased knowledge of precautions;Pain       PT Treatment Interventions DME instruction;Gait training;Stair training;Functional mobility training;Therapeutic activities;Therapeutic exercise;Neuromuscular re-education;Balance training;Patient/family education    PT Goals (Current goals can be found in the Care Plan section)  Acute Rehab PT Goals Patient Stated Goal: to go home  PT Goal Formulation: With patient Time For Goal Achievement: 07/06/17 Potential to Achieve Goals: Good    Frequency Min 5X/week   Barriers to discharge        Co-evaluation               AM-PAC PT "6 Clicks" Daily Activity  Outcome Measure Difficulty turning over in bed (including adjusting bedclothes, sheets and blankets)?: A Little Difficulty moving from lying on back to sitting on the side of the bed? : Unable Difficulty sitting down on and standing up from a chair with arms (e.g., wheelchair, bedside commode, etc,.)?: Unable Help needed moving to and from a bed to chair (including a wheelchair)?: A Little Help needed walking in hospital room?: A Little Help needed climbing 3-5 steps with a railing? : A Lot 6 Click Score: 13    End of Session Equipment Utilized During Treatment: Gait belt Activity Tolerance: Patient tolerated treatment well Patient left: in bed;with call bell/phone  within reach Nurse Communication: Mobility status PT Visit Diagnosis: Unsteadiness on feet (R26.81);Other abnormalities of gait and mobility (R26.89);Muscle weakness (generalized) (M62.81)    Time: 1610-9604 PT Time Calculation (min) (ACUTE ONLY): 17 min   Charges:   PT Evaluation $PT Eval Low Complexity: 1 Low     PT G Codes:        Gladys Damme, PT, DPT  Acute Rehabilitation Services  Pager: [phone]   Lehman Prom 06/22/2017, 4:51 PM

## 2017-06-22 NOTE — Transfer of Care (Signed)
Immediate Anesthesia Transfer of Care Note  Patient: Luke Morgan  Procedure(s) Performed: POSTERIOR LUMBAR FUSION, INTERBODY PROSTHESIS, POSTERIOR INSTRUMENTATION LUMBAR FOUR- LUMBAR FIVE (N/A Back)  Patient Location: PACU  Anesthesia Type:General  Level of Consciousness: awake and alert   Airway & Oxygen Therapy: Patient Spontanous Breathing and Patient connected to nasal cannula oxygen  Post-op Assessment: Report given to RN, Post -op Vital signs reviewed and stable and Patient moving all extremities X 4  Post vital signs: Reviewed and stable  Last Vitals:  Vitals:   06/22/17 0606  BP: 119/65  Pulse: 95  Resp: 20  Temp: 37.2 C  SpO2: 93%    Last Pain:  Vitals:   06/22/17 0620  TempSrc:   PainSc: 0-No pain      Patients Stated Pain Goal: 2 (06/22/17 0045)  Complications: No apparent anesthesia complications

## 2017-06-22 NOTE — Progress Notes (Signed)
Pt placed on cpap via large full face mask with 14cmh20. Pt tolerating well. Pt has own mask and machine at bedside, patient mask not used d/t not compatible with our machine. RT will monitor as needed.

## 2017-06-22 NOTE — H&P (Signed)
Subjective: The patient is a 67 year old morbidly complained of back, buttock and leg pain consistent with neurogenic claudication.  He has failed medical management and was worked up with lumbar x-rays and a lumbar MRI.  This demonstrated an L4-5 spondylolisthesis, facet arthropathy, spinal stenosis, etc.  I discussed the various treatment options with the patient.  He has decided to proceed with surgery.  Past Medical History:  Diagnosis Date  . Asthma   . Atrial fibrillation (HCC)   . History of kidney stones   . Hypertension   . Kidney stones   . Obesity   . Osteoarthritis   . Pre-diabetes    patient is taking metformin  . Sleep apnea   . Spondylolisthesis of lumbar region     Past Surgical History:  Procedure Laterality Date  . CARDIOVERSION N/A 12/24/2016   Procedure: CARDIOVERSION;  Surgeon: Antonieta Iba, MD;  Location: [facility] ORS;  Service: Cardiovascular;  Laterality: N/A;  . CARDIOVERSION N/A 01/04/2017   Procedure: CARDIOVERSION;  Surgeon: Duke Salvia, MD;  Location: [facility] ENDOSCOPY;  Service: Cardiovascular;  Laterality: N/A;  . CARDIOVERSION N/A 03/31/2017   Procedure: CARDIOVERSION;  Surgeon: Laurey Morale, MD;  Location: [facility] ENDOSCOPY;  Service: Cardiovascular;  Laterality: N/A;  . COLONOSCOPY    . TOOTH EXTRACTION      Allergies  Allergen Reactions  . Penicillins     UNSPECIFIED REACTION   Has patient had a PCN reaction causing immediate rash, facial/tongue/throat swelling, SOB or lightheadedness with hypotension: Unknown Has patient had a PCN reaction causing severe rash involving mucus membranes or skin necrosis: Unknown Has patient had a PCN reaction that required hospitalization No Has patient had a PCN reaction occurring within the last 10 years: No If all of the above answers are "NO", then may proceed with Cephalosporin use.    Social History   Tobacco Use  . Smoking status: Never Smoker  . Smokeless tobacco: Never Used  Substance Use Topics  .  Alcohol use: Yes    Comment: meals    Family History  Problem Relation Age of Onset  . Parkinson's disease Mother   . Cancer Mother        breast  . Heart disease Father   . Cancer Paternal Grandfather        colon   Prior to Admission medications   Medication Sig Start Date End Date Taking? Authorizing Provider  acetaminophen (TYLENOL) 500 MG tablet Take 500 mg by mouth 3 (three) times daily.    Yes [provider]  allopurinol (ZYLOPRIM) 300 MG tablet Take 300 mg by mouth every evening.    Yes [provider]  apixaban (ELIQUIS) 5 MG TABS tablet Take 5 mg by mouth 2 (two) times daily.   Yes [provider]  Fluticasone-Salmeterol (ADVAIR) 100-50 MCG/DOSE AEPB Inhale 1 puff into the lungs 2 (two) times daily as needed (shortness of breath).    Yes [provider]  furosemide (LASIX) 40 MG tablet Take 1 tablet (40 mg total) every other day by mouth. 04/01/17 10/28/17 Yes Sheilah Pigeon, PA-C  gabapentin (NEURONTIN) 300 MG capsule Take 300 mg by mouth 3 (three) times daily.   Yes [provider]  hydrALAZINE (APRESOLINE) 50 MG tablet Take 50 mg by mouth 3 (three) times daily.   Yes [provider]  losartan (COZAAR) 100 MG tablet Take 1 tablet (100 mg total) by mouth daily. 05/05/17  Yes Duke Salvia, MD  metFORMIN (GLUCOPHAGE) 500 MG tablet Take 500  mg daily with breakfast by mouth.    Yes [provider]  metoprolol succinate (TOPROL-XL) 100 MG 24 hr tablet Take 100 mg by mouth daily. Take with or immediately following a meal.   Yes [provider]  Multiple Vitamins-Minerals (MULTIVITAMIN WITH MINERALS) tablet Take 1 tablet by mouth daily.   Yes [provider]  potassium chloride (K-DUR) 10 MEQ tablet Take 1 tablet (10 mEq total) every other day by mouth. On days you take furosemide 04/01/17  Yes Sheilah Pigeon, PA-C  spironolactone (ALDACTONE) 25 MG tablet Take 1 tablet (25 mg total) daily by mouth.  04/01/17 04/01/18 Yes Sheilah Pigeon, PA-C     Review of Systems  Positive ROS: As above  All other systems have been reviewed and were otherwise negative with the exception of those mentioned in the HPI and as above.  Objective: Vital signs in last 24 hours: Temp:  [98.9 F (37.2 C)] 98.9 F (37.2 C) (01/30 0606) Pulse Rate:  [95] 95 (01/30 0606) Resp:  [20] 20 (01/30 0606) BP: (119)/(65) 119/65 (01/30 0606) SpO2:  [93 %] 93 % (01/30 0606) Weight:  [177.8 kg (392 lb)] 177.8 kg (392 lb) (01/30 0620) Estimated body mass index is 46.48 kg/m as calculated from the following:   Height as of this encounter: 6\' 5"  (1.956 m).   Weight as of this encounter: 177.8 kg (392 lb).   General Appearance: Alert, morbidly obese Head: Normocephalic, without obvious abnormality, atraumatic Eyes: PERRL, conjunctiva/corneas clear, EOM's intact,    Ears: Normal  Throat: Normal  Neck: Supple, Back: unremarkable Lungs: Clear to auscultation bilaterally, respirations unlabored Heart: Regular rate and rhythm, no murmur, rub or gallop Abdomen: Soft, non-tender Extremities: Extremities normal, atraumatic, no cyanosis or edema Skin: unremarkable  NEUROLOGIC:   Mental status: alert and oriented,Motor Exam - grossly normal Sensory Exam - grossly normal Reflexes:  Coordination - grossly normal Gait - grossly normal Balance - grossly normal Cranial Nerves: I: smell Not tested  II: visual acuity  OS: Normal  OD: Normal   II: visual fields Full to confrontation  II: pupils Equal, round, reactive to light  III,VII: ptosis None  III,IV,VI: extraocular muscles  Full ROM  V: mastication Normal  V: facial light touch sensation  Normal  V,VII: corneal reflex  Present  VII: facial muscle function - upper  Normal  VII: facial muscle function - lower Normal  VIII: hearing Not tested  IX: soft palate elevation  Normal  IX,X: gag reflex Present  XI: trapezius strength  5/5  XI: sternocleidomastoid  strength 5/5  XI: neck flexion strength  5/5  XII: tongue strength  Normal    Data Review Lab Results  Component Value Date   WBC 7.8 06/15/2017   HGB 15.8 06/15/2017   HCT 48.6 06/15/2017   MCV 89.0 06/15/2017   PLT 239 06/15/2017   Lab Results  Component Value Date   NA 140 06/15/2017   K 4.1 06/15/2017   CL 103 06/15/2017   CO2 25 06/15/2017   BUN 20 06/15/2017   CREATININE 1.14 06/15/2017   GLUCOSE 132 (H) 06/15/2017   No results found for: INR, PROTIME  Assessment/Plan: L4-5 spondylolisthesis, spinal stenosis, facet arthropathy, lumbago, lumbar radiculopathy, neurogenic claudication: I have discussed the situation with the patient.  I have reviewed his imaging studies with him and pointed out the abnormalities.  We have discussed the various treatment options including surgery.  I have described the surgical treatment option of an L4-5 decompression, instrumentation  and fusion.  I have shown him surgical models.  I have given him a surgical pamphlet.  We have discussed the risks, benefits, alternatives, expected postoperative course, and likelihood of achieving our goals with surgery.  I have answered all the patient's, and his family's, questions.  He has decided to proceed with surgery.   Cristi Loron 06/22/2017 7:22 AM

## 2017-06-22 NOTE — Anesthesia Postprocedure Evaluation (Signed)
Anesthesia Post Note  Patient: Luke Morgan  Procedure(s) Performed: POSTERIOR LUMBAR FUSION, INTERBODY PROSTHESIS, POSTERIOR INSTRUMENTATION LUMBAR FOUR- LUMBAR FIVE (N/A Back)     Patient location during evaluation: PACU Anesthesia Type: General Level of consciousness: sedated Pain management: pain level controlled Vital Signs Assessment: post-procedure vital signs reviewed and stable Respiratory status: spontaneous breathing and respiratory function stable Cardiovascular status: stable Postop Assessment: no apparent nausea or vomiting Anesthetic complications: no    Last Vitals:  Vitals:   06/22/17 1300 06/22/17 1413  BP: 115/77 98/67  Pulse: 90 93  Resp: 12 18  Temp:  36.7 C  SpO2: 92% 94%    Last Pain:  Vitals:   06/22/17 1154  TempSrc:   PainSc: 0-No pain                 Harsh Trulock DANIEL

## 2017-06-22 NOTE — Op Note (Signed)
Brief history: The patient is a 67 year old morbidly obese white male who has complained of back and leg pain and numbness consistent with neurogenic claudication.  He has failed medical management and was worked up with a lumbar MRI and x-rays.  This demonstrated an L4-5 spondylolisthesis with spinal stenosis.  I discussed the various treatment option with the patient including surgery.  He has weighed the risks, benefits, and alternatives of surgery and decided proceed with an L4-5 decompression, instrumentation, and fusion.  Preoperative diagnosis: L4-5 spondylolisthesis, facet arthropathy, degenerative disc disease, spinal stenosis compressing both the L4 and the L5 nerve roots; lumbago; lumbar radiculopathy  Postoperative diagnosis: The same  Procedure: Bilateral L4-5 laminotomy/foraminotomies to decompress the bilateral L4 and L5 nerve roots(the work required to do this was in addition to the work required to do the posterior lumbar interbody fusion because of the patient's spinal stenosis, facet arthropathy. Etc. requiring a wide decompression of the nerve roots.);  L4-5 transforaminal lumbar interbody fusion with local morselized autograft bone and Kinnex graft extender; insertion of interbody prosthesis at L4-5 (globus peek expandable interbody prosthesis); posterior nonsegmental instrumentation from L4 to L5 with globus titanium pedicle screws and rods; posterior lateral arthrodesis at L4-5 with local morselized autograft bone and Kinnex bone graft extender.  Surgeon: Dr. Delma Officer  Asst.: Dr. Marikay Alar  Anesthesia: Gen. endotracheal  Estimated blood loss: 300 cc  Drains: None  Complications: None  Description of procedure: The patient was brought to the operating room by the anesthesia team. General endotracheal anesthesia was induced. The patient was turned to the prone position on the Wilson frame. The patient's lumbosacral region was then prepared with Betadine scrub and  Betadine solution. Sterile drapes were applied.  I then injected the area to be incised with Marcaine with epinephrine solution. I then used the scalpel to make a linear midline incision over the L4-5 interspace. I then used electrocautery to perform a bilateral subperiosteal dissection exposing the spinous process and lamina of L4 and L5. We then obtained intraoperative radiograph to confirm our location. We then inserted the Verstrac retractor to provide exposure.  I began the decompression by using the high speed drill to perform laminotomies at L4-5 bilaterally. We then used the Kerrison punches to widen the laminotomy and removed the ligamentum flavum at L4-5 bilaterally. We used the Kerrison punches to remove the medial facets at L4-5 bilaterally. We performed wide foraminotomies about the bilateral L4 and L5 nerve roots completing the decompression.  We now turned our attention to the posterior lumbar interbody fusion. I used a scalpel to incise the intervertebral disc at L4-5 bilaterally. I then performed a partial intervertebral discectomy at L4-5 bilaterally using the pituitary forceps. We prepared the vertebral endplates at L4-5 bilaterally for the fusion by removing the soft tissues with the curettes. We then used the trial spacers to pick the appropriate sized interbody prosthesis. We prefilled his prosthesis with a combination of local morselized autograft bone that we obtained during the decompression as well as Kinnex bone graft extender. We inserted the prefilled prosthesis into the interspace at L4-5, we then expanded the prosthesis. There was a good snug fit of the prosthesis in the interspace. We then filled and the remainder of the intervertebral disc space with local morselized autograft bone and Kinnex. This completed the posterior lumbar interbody arthrodesis.  We now turned attention to the instrumentation. Under fluoroscopic guidance we cannulated the bilateral L4 and L5 pedicles  with the bone probe. We then removed the bone  probe. We then tapped the pedicle with a 6.5 millimeter tap. We then removed the tap. We probed inside the tapped pedicle with a ball probe to rule out cortical breaches. We then inserted a 7.5 x 60 millimeter pedicle screw into the 4 and L5 pedicles bilaterally under fluoroscopic guidance. We then palpated along the medial aspect of the pedicles to rule out cortical breaches. There were none. The nerve roots were not injured. We then connected the unilateral pedicle screws with a lordotic rod. We compressed the construct and secured the rod in place with the caps. We then tightened the caps appropriately. This completed the instrumentation from L4-5 bilaterally.  We now turned our attention to the posterior lateral arthrodesis at L4-5 bilaterally. We used the high-speed drill to decorticate the remainder of the facets, pars, transverse process at L4-5 bilaterally. We then applied a combination of local morselized autograft bone and Kinnex bone graft extender over these decorticated posterior lateral structures. This completed the posterior lateral arthrodesis.  We then obtained hemostasis using bipolar electrocautery. We irrigated the wound out with bacitracin solution. We inspected the thecal sac and nerve roots and noted they were well decompressed. We then removed the retractor. We placed vancomycin powder in the wound. We reapproximated patient's thoracolumbar fascia with interrupted #1 Vicryl suture. We reapproximated patient's subcutaneous tissue with interrupted 2-0 Vicryl suture. The reapproximated patient's skin with Steri-Strips and benzoin. The wound was then coated with bacitracin ointment. A sterile dressing was applied. The drapes were removed. The patient was subsequently returned to the supine position where they were extubated by the anesthesia team. He was then transported to the post anesthesia care unit in stable condition. All sponge instrument  and needle counts were reportedly correct at the end of this case.

## 2017-06-22 NOTE — Progress Notes (Signed)
Orthopedic Tech Progress Note Patient Details:  Luke Morgan 30-Dec-1950 [id number] Brace completed by bio-tech. Patient ID: Luke Morgan, male   DOB: 1951/05/11, 67 y.o.   MRN: [id number]   [name] 06/22/2017, 3:00 PM

## 2017-06-23 ENCOUNTER — Other Ambulatory Visit: Payer: Self-pay

## 2017-06-23 LAB — GLUCOSE, CAPILLARY
GLUCOSE-CAPILLARY: 71 mg/dL (ref 65–99)
Glucose-Capillary: 108 mg/dL — ABNORMAL HIGH (ref 65–99)

## 2017-06-23 LAB — CBC
HEMATOCRIT: 39.3 % (ref 39.0–52.0)
HEMOGLOBIN: 12.5 g/dL — AB (ref 13.0–17.0)
MCH: 28.7 pg (ref 26.0–34.0)
MCHC: 31.8 g/dL (ref 30.0–36.0)
MCV: 90.1 fL (ref 78.0–100.0)
Platelets: 182 10*3/uL (ref 150–400)
RBC: 4.36 MIL/uL (ref 4.22–5.81)
RDW: 15.4 % (ref 11.5–15.5)
WBC: 7.9 10*3/uL (ref 4.0–10.5)

## 2017-06-23 LAB — BASIC METABOLIC PANEL
Anion gap: 9 (ref 5–15)
BUN: 19 mg/dL (ref 6–20)
CHLORIDE: 106 mmol/L (ref 101–111)
CO2: 23 mmol/L (ref 22–32)
CREATININE: 1.1 mg/dL (ref 0.61–1.24)
Calcium: 8.3 mg/dL — ABNORMAL LOW (ref 8.9–10.3)
GFR calc Af Amer: >60 mL/min (ref >60)
GFR calc non Af Amer: >60 mL/min (ref >60)
Glucose, Bld: 109 mg/dL — ABNORMAL HIGH (ref 65–99)
POTASSIUM: 4.5 mmol/L (ref 3.5–5.1)
Sodium: 138 mmol/L (ref 135–145)

## 2017-06-23 MED ORDER — CYCLOBENZAPRINE HCL 10 MG PO TABS: 10.0000 mg | ORAL_TABLET | Freq: Three times a day (TID) | ORAL | 1 refills | Status: DC | PRN

## 2017-06-23 MED ORDER — DOCUSATE SODIUM 100 MG PO CAPS: 100.0000 mg | ORAL_CAPSULE | Freq: Two times a day (BID) | ORAL | 0 refills | Status: DC

## 2017-06-23 MED ORDER — OXYCODONE HCL 10 MG PO TABS: 10.0000 mg | ORAL_TABLET | ORAL | 0 refills | Status: DC | PRN

## 2017-06-23 MED FILL — Heparin Sodium (Porcine) Inj 1000 Unit/ML: INTRAMUSCULAR | Qty: 30 | Status: AC

## 2017-06-23 MED FILL — Sodium Chloride IV Soln 0.9%: INTRAVENOUS | Qty: 1000 | Status: AC

## 2017-06-23 MED FILL — Thrombin For Soln 5000 Unit: CUTANEOUS | Qty: 5000 | Status: AC

## 2017-06-23 NOTE — Progress Notes (Signed)
Physical Therapy Treatment Patient Details Name: Luke Morgan MRN: [id number] DOB: 07/17/1950 Today's Date: 06/23/2017    History of Present Illness Pt is a 67 y/o male s/p L4-5 PLIF. PMH includes a fib, HTN, pre diabetes, OSA on CPAP, and s/p cardioversion.     PT Comments    Pt progressing towards physical therapy goals. Was able to perform transfers and ambulation with gross min guard assist for balance support and safety. Pt was able to ambulate fairly well with RW. States he has a RW at home, however feel he would benefit from a bariatric walker that is a tall size as pt is 6'5". Doubt the walker pt has at home is this type however pt does not want another. RN updated if pt changes his mind and wants the tall bariatric walker.    Follow Up Recommendations  No PT follow up;Supervision for mobility/OOB     Equipment Recommendations  Rolling walker with 5" wheels(bariatric; tall)    Recommendations for Other Services OT consult     Precautions / Restrictions Precautions Precautions: Back Precaution Booklet Issued: No Precaution Comments: Reviewed precautions verbally and pt was cued for precautions during functional mobility.  Required Braces or Orthoses: Spinal Brace Spinal Brace: Lumbar corset;Applied in sitting position Restrictions Weight Bearing Restrictions: No    Mobility  Bed Mobility Overal bed mobility: Needs Assistance Bed Mobility: Sidelying to Sit   Sidelying to sit: Supervision     Sit to sidelying: Min guard General bed mobility comments: HOB flat with use of bed rails. Close supervision for safety as pt transitioned to full sitting position.   Transfers Overall transfer level: Needs assistance Equipment used: Rolling walker (2 wheeled) Transfers: Sit to/from Stand Sit to Stand: Min guard         General transfer comment: Close min guard for safety, pt with unsteadiness in standing   Ambulation/Gait Ambulation/Gait assistance: Min  guard Ambulation Distance (Feet): 300 Feet Assistive device: Rolling walker (2 wheeled) Gait Pattern/deviations: Step-through pattern;Decreased stride length;Trunk flexed Gait velocity: Decreased Gait velocity interpretation: Below normal speed for age/gender General Gait Details: Pt prefers to ambulate with arms draped over therapist's shoulders. Explained this was not the safest method and PT would prefer pt use RW. Overall pt appeared steady with bilateral UE support.    Stairs            Wheelchair Mobility    Modified Rankin (Stroke Patients Only)       Balance Overall balance assessment: Needs assistance Sitting-balance support: No upper extremity supported;Feet supported Sitting balance-Leahy Scale: Good     Standing balance support: Single extremity supported;During functional activity Standing balance-Leahy Scale: Fair Standing balance comment: Reliant on BUE support.                             Cognition Arousal/Alertness: Awake/alert Behavior During Therapy: WFL for tasks assessed/performed Overall Cognitive Status: Within Functional Limits for tasks assessed                                        Exercises      General Comments        Pertinent Vitals/Pain Pain Assessment: Faces Faces Pain Scale: Hurts little more Pain Location: back Pain Descriptors / Indicators: Aching;Sore Pain Intervention(s): Limited activity within patient's tolerance;Monitored during session;Repositioned    Home Living Family/patient expects  to be discharged to:: Private residence Living Arrangements: Spouse/significant other Available Help at Discharge: Family;Available 24 hours/day Type of Home: House Home Access: Stairs to enter Entrance Stairs-Rails: Right;Left Home Layout: Two level;Able to live on main level with bedroom/bathroom Home Equipment: Cane - single point;Grab bars - tub/shower      Prior Function Level of Independence:  Independent with assistive device(s)      Comments: Used cane for ambulation    PT Goals (current goals can now be found in the care plan section) Acute Rehab PT Goals Patient Stated Goal: to go home  PT Goal Formulation: With patient Time For Goal Achievement: 07/06/17 Potential to Achieve Goals: Good Progress towards PT goals: Progressing toward goals    Frequency    Min 5X/week      PT Plan Current plan remains appropriate    Co-evaluation              AM-PAC PT "6 Clicks" Daily Activity  Outcome Measure  Difficulty turning over in bed (including adjusting bedclothes, sheets and blankets)?: A Little Difficulty moving from lying on back to sitting on the side of the bed? : Unable Difficulty sitting down on and standing up from a chair with arms (e.g., wheelchair, bedside commode, etc,.)?: Unable Help needed moving to and from a bed to chair (including a wheelchair)?: A Little Help needed walking in hospital room?: A Little Help needed climbing 3-5 steps with a railing? : A Little 6 Click Score: 14    End of Session Equipment Utilized During Treatment: Gait belt Activity Tolerance: Patient tolerated treatment well Patient left: in bed;with call bell/phone within reach Nurse Communication: Mobility status PT Visit Diagnosis: Unsteadiness on feet (R26.81);Other abnormalities of gait and mobility (R26.89);Muscle weakness (generalized) (M62.81)     Time: 7035-0093 PT Time Calculation (min) (ACUTE ONLY): 23 min  Charges:  $Gait Training: 23-37 mins                    G Codes:       Luke Morgan, PT, DPT Acute Rehabilitation Services Pager: [phone]    Marylynn Pearson 06/23/2017, 10:59 AM

## 2017-06-23 NOTE — Evaluation (Signed)
Occupational Therapy Evaluation Patient Details Name: Luke Morgan MRN: [id number] DOB: [birth year] Today's Date: 06/23/2017    History of Present Illness Pt is a 67 y/o male s/p L4-5 PLIF. PMH includes a fib, HTN, pre diabetes, OSA on CPAP, and s/p cardioversion.    Clinical Impression   Pt reports he was independent with ADL PTA. Currently pt overall min guard assist for functional mobility and min assist for ADL. Began back, safety, and ADL education with pt. Pt planning to d/c home with supervision from family. Pt would benefit from continued skilled OT to address established goals.    Follow Up Recommendations  No OT follow up;Supervision/Assistance - 24 hour    Equipment Recommendations  None recommended by OT    Recommendations for Other Services       Precautions / Restrictions Precautions Precautions: Back Precaution Booklet Issued: No Precaution Comments: Reviewed all back precautions with pt Required Braces or Orthoses: Spinal Brace Spinal Brace: Lumbar corset;Applied in sitting position Restrictions Weight Bearing Restrictions: No      Mobility Bed Mobility Overal bed mobility: Needs Assistance Bed Mobility: Sit to Sidelying         Sit to sidelying: Min guard General bed mobility comments: HOB flat with use of bed rails  Transfers Overall transfer level: Needs assistance Equipment used: Straight cane Transfers: Sit to/from Stand Sit to Stand: Min guard         General transfer comment: Close min guard for safety, pt with unsteadiness in standing     Balance Overall balance assessment: Needs assistance Sitting-balance support: No upper extremity supported;Feet supported Sitting balance-Leahy Scale: Good     Standing balance support: Single extremity supported;During functional activity Standing balance-Leahy Scale: Fair                             ADL either performed or assessed with clinical judgement   ADL Overall  ADL's : Needs assistance/impaired Eating/Feeding: Set up;Sitting   Grooming: Set up;Supervision/safety;Sitting Grooming Details (indicate cue type and reason): Educated on use of 2 cups for oral care Upper Body Bathing: Minimal assistance;Sitting   Lower Body Bathing: Minimal assistance;Sit to/from stand   Upper Body Dressing : Sitting;Moderate assistance Upper Body Dressing Details (indicate cue type and reason): Educated pt on brace management and wear schedule Lower Body Dressing: Minimal assistance;Sit to/from stand Lower Body Dressing Details (indicate cue type and reason): Pt almost able to cross foot over opposite knee, reports wife can assist as needed. May benefit from AE education Toilet Transfer: Min guard;Ambulation(cane) Toilet Transfer Details (indicate cue type and reason): Simulated   Toileting - Clothing Manipulation Details (indicate cue type and reason): Educated on proper technqiue for peri care without twisting Tub/ Shower Transfer: Min guard;Tub transfer;Ambulation;Grab bars Tub/Shower Transfer Details (indicate cue type and reason): Simulated in room Functional mobility during ADLs: Min guard;Cane General ADL Comments: Educated pt on maintaining back precuations during functional activities, keeping frequently used items at counter top height, frequent mobility thorughout the day, log roll for bed mobility.     Vision         Perception     Praxis      Pertinent Vitals/Pain Pain Assessment: Faces Faces Pain Scale: Hurts even more Pain Location: back Pain Descriptors / Indicators: Aching;Sore Pain Intervention(s): Monitored during session;Repositioned     Hand Dominance     Extremity/Trunk Assessment Upper Extremity Assessment Upper Extremity Assessment: Overall WFL for tasks assessed  Lower Extremity Assessment Lower Extremity Assessment: Defer to PT evaluation   Cervical / Trunk Assessment Cervical / Trunk Assessment: Other  exceptions Cervical / Trunk Exceptions: s/p PLIF    Communication Communication Communication: No difficulties   Cognition Arousal/Alertness: Awake/alert Behavior During Therapy: WFL for tasks assessed/performed Overall Cognitive Status: Within Functional Limits for tasks assessed                                     General Comments       Exercises     Shoulder Instructions      Home Living Family/patient expects to be discharged to:: Private residence Living Arrangements: Spouse/significant other Available Help at Discharge: Family;Available 24 hours/day Type of Home: House Home Access: Stairs to enter [employer] of Steps: 7 Entrance Stairs-Rails: Right;Left Home Layout: Two level;Able to live on main level with bedroom/bathroom     Bathroom Shower/Tub: Tub/shower unit;Curtain   Bathroom Toilet: Handicapped height     Home Equipment: Cane - single point;Grab bars - tub/shower          Prior Functioning/Environment Level of Independence: Independent with assistive device(s)        Comments: Used cane for ambulation         OT Problem List: Decreased strength;Decreased activity tolerance;Impaired balance (sitting and/or standing);Decreased knowledge of use of DME or AE;Decreased knowledge of precautions;Obesity;Pain      OT Treatment/Interventions: Self-care/ADL training;DME and/or AE instruction;Therapeutic activities;Patient/family education;Balance training    OT Goals(Current goals can be found in the care plan section) Acute Rehab OT Goals Patient Stated Goal: to go home  OT Goal Formulation: With patient Time For Goal Achievement: 07/07/17 Potential to Achieve Goals: Good ADL Goals Pt Will Perform Lower Body Bathing: with supervision;with adaptive equipment;sit to/from stand Pt Will Perform Lower Body Dressing: with supervision;sit to/from stand;with adaptive equipment Additional ADL Goal #1: Pt will independently verbally  recall 3/3 back precautions and maintain throughout ADL. Additional ADL Goal #2: Pt will don/doff back brace with set up as precursor to mobility.  OT Frequency: Min 2X/week   Barriers to D/C:            Co-evaluation              AM-PAC PT "6 Clicks" Daily Activity     Outcome Measure Help from another person eating meals?: None Help from another person taking care of personal grooming?: A Little Help from another person toileting, which includes using toliet, bedpan, or urinal?: A Little Help from another person bathing (including washing, rinsing, drying)?: A Little Help from another person to put on and taking off regular upper body clothing?: A Little Help from another person to put on and taking off regular lower body clothing?: A Little 6 Click Score: 19   End of Session Equipment Utilized During Treatment: Rolling walker;Back brace Nurse Communication: Mobility status;Other (comment)(no equipment or f/u needs)  Activity Tolerance: Patient tolerated treatment well Patient left: in bed;with call bell/phone within reach  OT Visit Diagnosis: Unsteadiness on feet (R26.81);Pain Pain - part of body: (back)                Time: 9147-8295 OT Time Calculation (min): 14 min Charges:  OT General Charges $OT Visit: 1 Visit OT Evaluation $OT Eval Moderate Complexity: 1 Mod G-Codes:     Karrigan Messamore A. Brett Albino, M.S., OTR/L Pager: [phone]  Gaye Alken 06/23/2017, 10:03 AM

## 2017-06-23 NOTE — Discharge Summary (Signed)
Physician Discharge Summary  Patient ID: Luke Morgan MRN: [id number] DOB/AGE: [birth year] 67 y.o.  Admit date: 06/22/2017 Discharge date: 06/23/2017  Admission Diagnoses:L4-5 spondylolisthesis, spinal stenosis, lumbago,radiculopathy, neurogenic claudication  Discharge Diagnoses: the same Active Problems:   Spondylolisthesis of lumbar region   Discharged Condition: good  Hospital Course: I performed an L4-5 decompression, instrumentation and fusion on the patient on 06/22/2017. The surgery went well.  On postoperative day #1 the patient requested discharge to home. He was given written and oral discharge instructions. He was instructed to resume his eliquis on 06/26/2017. All his questions were answered.  Consults:physical therapy Significant Diagnostic Studies:none Treatments:L4-5 decompression, instrumentation, and fusion. Discharge Exam: Blood pressure 104/72, pulse 100, temperature 98.6 F (37 C), resp. rate 18, height 6\' 5"  (1.956 m), weight (!) 177.8 kg (392 lb), SpO2 95 %. The patient is alert and pleasant. He looks well. His strength is grossly normal in his lower extremities.  Disposition: home  Discharge Instructions    Call MD for:  difficulty breathing, headache or visual disturbances   Complete by:  As directed    Call MD for:  extreme fatigue   Complete by:  As directed    Call MD for:  hives   Complete by:  As directed    Call MD for:  persistant dizziness or light-headedness   Complete by:  As directed    Call MD for:  persistant nausea and vomiting   Complete by:  As directed    Call MD for:  redness, tenderness, or signs of infection (pain, swelling, redness, odor or green/yellow discharge around incision site)   Complete by:  As directed    Call MD for:  severe uncontrolled pain   Complete by:  As directed    Call MD for:  temperature >100.4   Complete by:  As directed    Diet - low sodium heart healthy   Complete by:  As directed    Discharge  instructions   Complete by:  As directed    Call [phone] for a followup appointment. Take a stool softener while you are using pain medications.   Driving Restrictions   Complete by:  As directed    Do not drive for 2 weeks.   Increase activity slowly   Complete by:  As directed    Lifting restrictions   Complete by:  As directed    Do not lift more than 5 pounds. No excessive bending or twisting.   May shower / Bathe   Complete by:  As directed    He may shower after the pain she is removed 3 days after surgery. Leave the incision alone.   Remove dressing in 48 hours   Complete by:  As directed    Your stitches are under the scan and will dissolve by themselves. The Steri-Strips will fall off after you take a few showers. Do not rub back or pick at the wound, Leave the wound alone.     Allergies as of 06/23/2017      Reactions   Penicillins    UNSPECIFIED REACTION  Has patient had a PCN reaction causing immediate rash, facial/tongue/throat swelling, SOB or lightheadedness with hypotension: Unknown Has patient had a PCN reaction causing severe rash involving mucus membranes or skin necrosis: Unknown Has patient had a PCN reaction that required hospitalization No Has patient had a PCN reaction occurring within the last 10 years: No If all of the above answers are "NO", then may proceed with  Cephalosporin use.      Medication List    STOP taking these medications   acetaminophen 500 MG tablet Commonly known as:  TYLENOL   allopurinol 300 MG tablet Commonly known as:  ZYLOPRIM     TAKE these medications   cyclobenzaprine 10 MG tablet Commonly known as:  FLEXERIL Take 1 tablet (10 mg total) by mouth 3 (three) times daily as needed for muscle spasms.   docusate sodium 100 MG capsule Commonly known as:  COLACE Take 1 capsule (100 mg total) by mouth 2 (two) times daily.   ELIQUIS 5 MG Tabs tablet Generic drug:  apixaban Take 5 mg by mouth 2 (two) times daily.    Fluticasone-Salmeterol 100-50 MCG/DOSE Aepb Commonly known as:  ADVAIR Inhale 1 puff into the lungs 2 (two) times daily as needed (shortness of breath).   furosemide 40 MG tablet Commonly known as:  LASIX Take 1 tablet (40 mg total) every other day by mouth.   gabapentin 300 MG capsule Commonly known as:  NEURONTIN Take 300 mg by mouth 3 (three) times daily.   hydrALAZINE 50 MG tablet Commonly known as:  APRESOLINE Take 50 mg by mouth 3 (three) times daily.   losartan 100 MG tablet Commonly known as:  COZAAR Take 1 tablet (100 mg total) by mouth daily.   metFORMIN 500 MG tablet Commonly known as:  GLUCOPHAGE Take 500 mg daily with breakfast by mouth.   metoprolol succinate 100 MG 24 hr tablet Commonly known as:  TOPROL-XL Take 100 mg by mouth daily. Take with or immediately following a meal.   multivitamin with minerals tablet Take 1 tablet by mouth daily.   Oxycodone HCl 10 MG Tabs Take 1 tablet (10 mg total) by mouth every 4 (four) hours as needed for severe pain ((score 7 to 10)).   potassium chloride 10 MEQ tablet Commonly known as:  K-DUR Take 1 tablet (10 mEq total) every other day by mouth. On days you take furosemide   spironolactone 25 MG tablet Commonly known as:  ALDACTONE Take 1 tablet (25 mg total) daily by mouth.        Signed: Cristi Loron 06/23/2017, 10:38 AM

## 2017-06-23 NOTE — Discharge Summary (Signed)
Physician Discharge Summary  Patient ID: Luke Morgan MRN: [id number] DOB/AGE: [birth year] 66 y.o.  Admit date: 06/22/2017 Discharge date: 06/23/2017  Admission Diagnoses: L4-5 spondylolisthesis, facet arthropathy, spinal stenosis, lumbago, lumbar radiculopathy, neurogenic claudication  Discharge Diagnoses: The same Active Problems:   Spondylolisthesis of lumbar region   Discharged Condition: good  Hospital Course: I performed an L4-5 decompression, instrumentation and fusion on the patient on 06/22/2017.  The surgery went well.  The patient's postoperative course was unremarkable.  On postoperative #1 he requested discharge home.  The patient was given oral and written discharge instructions.  All his questions were answered.  Consults: Physical therapy Significant Diagnostic Studies: None Treatments: L4-5 decompression, instrumentation and fusion. Discharge Exam: Blood pressure 104/72, pulse 100, temperature 98.6 F (37 C), resp. rate 18, height 6\' 5"  (1.956 m), weight (!) 177.8 kg (392 lb), SpO2 95 %. The patient is alert and pleasant.  He looks well.  His strength is grossly normal in his lower extremities.  Disposition: Home  Discharge Instructions    Call MD for:  difficulty breathing, headache or visual disturbances   Complete by:  As directed    Call MD for:  extreme fatigue   Complete by:  As directed    Call MD for:  hives   Complete by:  As directed    Call MD for:  persistant dizziness or light-headedness   Complete by:  As directed    Call MD for:  persistant nausea and vomiting   Complete by:  As directed    Call MD for:  redness, tenderness, or signs of infection (pain, swelling, redness, odor or green/yellow discharge around incision site)   Complete by:  As directed    Call MD for:  severe uncontrolled pain   Complete by:  As directed    Call MD for:  temperature >100.4   Complete by:  As directed    Diet - low sodium heart healthy   Complete by:   As directed    Discharge instructions   Complete by:  As directed    Call [phone] for a followup appointment. Take a stool softener while you are using pain medications.   Driving Restrictions   Complete by:  As directed    Do not drive for 2 weeks.   Increase activity slowly   Complete by:  As directed    Lifting restrictions   Complete by:  As directed    Do not lift more than 5 pounds. No excessive bending or twisting.   May shower / Bathe   Complete by:  As directed    He may shower after the pain she is removed 3 days after surgery. Leave the incision alone.   Remove dressing in 48 hours   Complete by:  As directed    Your stitches are under the scan and will dissolve by themselves. The Steri-Strips will fall off after you take a few showers. Do not rub back or pick at the wound, Leave the wound alone.        Signed: Cristi Loron 06/23/2017, 9:52 AM

## 2017-06-23 NOTE — Progress Notes (Signed)
Patient is discharged from room 3C03 at this time. Alert and in stable condition. IV site d/c'[d and instructions read to patient and spouse with understanding verbalized. Left unit via wheelchair with all belongings at side. 

## 2017-06-30 ENCOUNTER — Telehealth: Payer: Self-pay | Admitting: Internal Medicine

## 2017-06-30 NOTE — Telephone Encounter (Signed)
New Message   Luke Morgan is calling from Dr. Dorena Cookey office. Dr. Dorena Cookey who is requesting to speak with Dr. Graciela Husbands. Did not supply any additional information.

## 2017-07-01 NOTE — Telephone Encounter (Signed)
Dr. Graciela Husbands,   Did you try to call Dr. Marcello Fennel?

## 2017-07-01 NOTE — Telephone Encounter (Signed)
Per Dr. Graciela Husbands:  "I called handes number but it was a voicemail to his nurse like gave them my number"

## 2017-07-07 ENCOUNTER — Ambulatory Visit (INDEPENDENT_AMBULATORY_CARE_PROVIDER_SITE_OTHER): Payer: Medicare Other | Admitting: Internal Medicine

## 2017-07-07 ENCOUNTER — Encounter: Payer: Self-pay | Admitting: Internal Medicine

## 2017-07-07 VITALS — BP 126/86 | HR 92 | Ht 77.0 in | Wt 383.8 lb

## 2017-07-07 DIAGNOSIS — I481 Persistent atrial fibrillation: Secondary | ICD-10-CM

## 2017-07-07 DIAGNOSIS — I4819 Other persistent atrial fibrillation: Secondary | ICD-10-CM

## 2017-07-07 DIAGNOSIS — I428 Other cardiomyopathies: Secondary | ICD-10-CM | POA: Diagnosis not present

## 2017-07-07 NOTE — Patient Instructions (Signed)

## 2017-07-07 NOTE — Progress Notes (Signed)
Patient Care Team: Barbette Reichmann, MD as PCP - General (Internal Medicine)   HPI  Luke Morgan is a 67 y.o. male Seen with a chief complaint of atrial fibrillation with initial failure to cardiovert.  He was treated withpropafenone  and dofetilide.  No changes were noted with sinus rhythm because of recurrent failure of antiarrhythmic therapy was abandoned with a strategy of rate control.   DATE TEST EF   Sum 2018 Echo   43 %         He had spinal fusion a couple of weeks ago.  He was seen by his PCP thereafter with complaints of more shortness of breath.  He was noted to be in atrial fibrillation at 101.  Efforts for the 2 of [place] to talk but resulted in voicemail to be left.  He was advised to follow-up.    There were concerns about low blood pressure and rate control.  At that time he was on hydralazine, Cozaar, metoprolol, and Aldactone.  He is using his phone for sporadic heart rate measurements.  These numbers are mostly in the 80s and 90s although there was 128.  He has lost a great deal of weight now about 50 pounds.  He is continuing to work on this.  His knees are his major limitation at this point.  Although her shortness of breath it is gradually less over time; he has chronic lower extremity edema which is treated with every other day diuretics so as not to interfere with "all his life "    Date Cr K Hgb  7/18  0.88 4.0 15.5  11/18  1.02 3.5 14.2  2/19 0.9 3.9 13.5    Thromboembolic risk factors ( age -4 , HTN-1,) for a CHADSVASc Score of  2 Past Medical History:  Diagnosis Date  . Asthma   . Atrial fibrillation (HCC)   . History of kidney stones   . Hypertension   . Kidney stones   . Obesity   . Osteoarthritis   . Pre-diabetes    patient is taking metformin  . Sleep apnea   . Spondylolisthesis of lumbar region     Past Surgical History:  Procedure Laterality Date  . CARDIOVERSION N/A 12/24/2016   Procedure: CARDIOVERSION;  Surgeon:  Antonieta Iba, MD;  Location: [facility] ORS;  Service: Cardiovascular;  Laterality: N/A;  . CARDIOVERSION N/A 01/04/2017   Procedure: CARDIOVERSION;  Surgeon: Duke Salvia, MD;  Location: [facility] ENDOSCOPY;  Service: Cardiovascular;  Laterality: N/A;  . CARDIOVERSION N/A 03/31/2017   Procedure: CARDIOVERSION;  Surgeon: Laurey Morale, MD;  Location: [facility] ENDOSCOPY;  Service: Cardiovascular;  Laterality: N/A;  . COLONOSCOPY    . LUMBAR FUSION    . TOOTH EXTRACTION      Current Outpatient Medications  Medication Sig Dispense Refill  . apixaban (ELIQUIS) 5 MG TABS tablet Take 5 mg by mouth 2 (two) times daily.    . Fluticasone-Salmeterol (ADVAIR) 100-50 MCG/DOSE AEPB Inhale 1 puff into the lungs 2 (two) times daily as needed (shortness of breath).     . furosemide (LASIX) 40 MG tablet Take 1 tablet (40 mg total) every other day by mouth. 15 tablet 6  . gabapentin (NEURONTIN) 300 MG capsule Take 300 mg by mouth 3 (three) times daily.    . hydrALAZINE (APRESOLINE) 50 MG tablet Take 50 mg by mouth 3 (three) times daily.    Marland Kitchen losartan (COZAAR) 100 MG tablet Take 1 tablet (100 mg  total) by mouth daily.    . metFORMIN (GLUCOPHAGE) 500 MG tablet Take 500 mg daily with breakfast by mouth.     . metoprolol succinate (TOPROL-XL) 100 MG 24 hr tablet Take 100 mg by mouth daily. Take with or immediately following a meal.    . Multiple Vitamins-Minerals (MULTIVITAMIN WITH MINERALS) tablet Take 1 tablet by mouth daily.    . potassium chloride (K-DUR) 10 MEQ tablet Take 1 tablet (10 mEq total) every other day by mouth. On days you take furosemide 15 tablet 6  . spironolactone (ALDACTONE) 25 MG tablet Take 1 tablet (25 mg total) daily by mouth. 30 tablet 11   No current facility-administered medications for this visit.     Allergies  Allergen Reactions  . Penicillins     UNSPECIFIED REACTION   Has patient had a PCN reaction causing immediate rash, facial/tongue/throat swelling, SOB or lightheadedness with  hypotension: Unknown Has patient had a PCN reaction causing severe rash involving mucus membranes or skin necrosis: Unknown Has patient had a PCN reaction that required hospitalization No Has patient had a PCN reaction occurring within the last 10 years: No If all of the above answers are "NO", then may proceed with Cephalosporin use.      Review of Systems negative except from HPI and PMH  Physical Exam BP 126/86 (BP Location: Left Arm, Patient Position: Sitting, Cuff Size: Large)   Pulse 92   Ht 6\' 5"  (1.956 m)   Wt (!) 383 lb 12 oz (174.1 kg)   BMI 45.51 kg/m  Well developed and Morbidly obese  in no acute distress HENT normal Neck supple with JVP-flat Carotids brisk and full without bruits Clear Irregularly irregular rate and rhythm, no murmurs or gallops Abd-soft with active BS without hepatomegaly No Clubbing cyanosis 1+ edema Skin-warm and dry A & Oriented  Grossly normal sensory and motor function Gait is a little wide-based  ECG: Atrial fibrillation at 92 Interval-/0 8/36   Assessment and  Plan  Atrial fibrillation-persistent  Congestive heart failure   HFpEF  Morbid obesity   Hypertension   Cardiomyopathy-nonischemic presumably   High Risk Medication Surveillance   Atrial fibrillation seems to be mostly now.  Heart rate with excursion will be important to measure.  I have encouraged him to use a pulse oximeter for that purpose.  We may be able to avoid holter   BP is well controlled   If rates are fast would change his BP meds to accomplish greater rate control.    Will need to reassess LV function   On Anticoagulation;  No bleeding issues   K ok on aldactone  We spent more than 50% of our >25 min visit in face to face counseling regarding the above

## 2017-07-19 ENCOUNTER — Encounter: Payer: Self-pay | Admitting: Physical Therapy

## 2017-07-19 ENCOUNTER — Ambulatory Visit: Payer: Medicare Other | Attending: Internal Medicine | Admitting: Physical Therapy

## 2017-07-19 DIAGNOSIS — M6281 Muscle weakness (generalized): Secondary | ICD-10-CM

## 2017-07-19 DIAGNOSIS — R262 Difficulty in walking, not elsewhere classified: Secondary | ICD-10-CM

## 2017-07-19 NOTE — Therapy (Signed)
[facility] [address] [place], [address] Phone: [phone]   Fax:  [phone]  Physical Therapy Evaluation  Patient Details  Name: Luke Morgan MRN: [id number] Date of Birth: Feb 14, 1951 Referring Provider: Lovell Sheehan   Encounter Date: 07/19/2017  PT End of Session - 07/19/17 1505    Visit Number  1    Number of Visits  17    Date for PT Re-Evaluation  09/13/17    PT Start Time  0155    PT Stop Time  0256    PT Time Calculation (min)  61 min    Activity Tolerance  Patient tolerated treatment well    Behavior During Therapy  [facility] for tasks assessed/performed       Past Medical History:  Diagnosis Date  . Asthma   . Atrial fibrillation (HCC)   . History of kidney stones   . Hypertension   . Kidney stones   . Obesity   . Osteoarthritis   . Pre-diabetes    patient is taking metformin  . Sleep apnea   . Spondylolisthesis of lumbar region     Past Surgical History:  Procedure Laterality Date  . CARDIOVERSION N/A 12/24/2016   Procedure: CARDIOVERSION;  Surgeon: Antonieta Iba, MD;  Location: [facility] ORS;  Service: Cardiovascular;  Laterality: N/A;  . CARDIOVERSION N/A 01/04/2017   Procedure: CARDIOVERSION;  Surgeon: Duke Salvia, MD;  Location: [facility] ENDOSCOPY;  Service: Cardiovascular;  Laterality: N/A;  . CARDIOVERSION N/A 03/31/2017   Procedure: CARDIOVERSION;  Surgeon: Laurey Morale, MD;  Location: [facility] ENDOSCOPY;  Service: Cardiovascular;  Laterality: N/A;  . COLONOSCOPY    . LUMBAR FUSION    . TOOTH EXTRACTION      There were no vitals filed for this visit.   Subjective Assessment - 07/19/17 1411    Subjective  s/p 1/30 posterior lumbar fusion (L4-5) following longstanding spondylolysis and stenosis    Pertinent History  Patient is a pleasant 67 y/o male presenting s/p 1/30 posterior lumbar fusion (L4-5) following longstanding spondylolysis and stenosis. Patient ambulates w/ SPC and  lumbar brace in the community, reports he does not use them at home. Patient reports that prior to sx he had numbness and tingling down both LE for years, that have now subsided post surgically. Patient reports no pain in the low back today and 0/10 pain throughout the past week. Patient is only taking tylenol at this time. Patient is wearing a lumbar brace that he reports wearing when he goes out in the community. Patient reports he is currently on lifting restrictions >5lbs and excessive ROM for unspecified time (patient is under the impression this is open to PT). Pt reports post surgically his knees have been increasingly bothering him after longstanding chronic pain, and that with prolonged sitting he feels as though he has to have his cane in order to stand up and walk safely. Patient reports that he feels unsteady walking, but that has become better since his sensation has increased post surgicall, but he feels as though his LEs will "give out".     How long can you sit comfortably?  No limitations    How long can you stand comfortably?  10 minutes    Diagnostic tests  See MRI report, L4-L5 fusion    Patient Stated Goals  Be able to stand and walk for longer durations of time- would like to be able to do so w/o AD; would like to  be able to perform in play this Summer    Currently in Pain?  No/denies         [facility] PT Assessment - 07/19/17 0001      Assessment   Medical Diagnosis  S/p lumbar fusion L4/5    Referring Provider  Jenkins    Onset Date/Surgical Date  06/22/17    Hand Dominance  Right    Next MD Visit  -- end of March    Prior Therapy  -- Last year prior to surgery      Precautions   Precautions  -- lifting restrictions; ROM extremes      Restrictions   Weight Bearing Restrictions  No      Balance Screen   Has the patient fallen in the past 6 months  No    Has the patient had a decrease in activity level because of a fear of falling?   Yes    Is the patient reluctant to  leave their home because of a fear of falling?   Yes      Home Environment   Living Environment  Private residence same      Prior Function   Level of Independence  Independent    Vocation  Part time employment    Vocation Requirements  standing, walking    Leisure  Play with gradchildren      Sensation   Light Touch  -- intact    Additional Comments  -- Pt reports no sensation deficits       6 Minute Walk- Baseline   6 Minute Walk- Baseline  -- 982ft           ROM All lumbar motions wnl w/ pain w/ L lateral bending on L LB  Hip motions wnl except IR limited bilat  Strength Ankle DF 5/5 bilat Ankle PF bilat heel raise 75% of full ROM  Knee ext 5/5 bilat Knee flex 5/5 bilat Hip flex: 4/5 bilat Hip abd L:4/5 w/ pain R 4-/5 Hip Ext in prone: 3+/5 Hip IR: 3+/5 bilat Hip ER: 3+/5     Special Tests/ Other Reflexes 1+ bilat patellar / diminished bilat Achi lNortheast Alabama Eye [id number] [facility] [place] HKind<M AAustin Gi Surgicenter LLC<MEASURE ERichmond U iLindsbo gUniver  GSouthcoast Hospitals Gr u[facility] Of [facility] MemoriaD6250.9uRetta [facility] Mt e[place] HeaD6267.9uRetta [facility] lAc[facility] AD6242.9uRetta [facility] eDelta MemoriaD6241. uThe [facility] D6249.9uRetta [facility] pBetsy JohnsoD6221.9uR tThe HMetro Health MediD6242.9uRetta [facility] Hamotesite geologistnce NortheD6273.9uRetta [facility] Lung Centeresite geologistEndoscopy And Surgical Institute Of Ocala LLCesite geologistioral Health <MEASURE<MEASOlga Millersrtheastern Nevada RegionaD6283.9uRetta [facility] [phone] brace to stabilize spine       Objective measurements completed on examination: See above findings.      PT Education - 07/19/17 1502    Education Details  Patient was educated on diagnosis, anatomy and pathology involved, prognosis, role of PT, and was given an HEP, demonstrating exercise with proper form following verbal and tactile cues, and was given a paper hand out to continue exercise at home. Pt was educated on and agreed to plan of care.    Person(s)  Educated  Patient    Methods  Explanation;Demonstration;Tactile cues;Verbal cues;Handout    Comprehension  Verbalized understanding;Tactile cues required;Returned demonstration;Verbal cues required       PT Short Term Goals - 07/19/17 1524      PT SHORT TERM GOAL #  1   Title  Pt will be independent with HEP in order to improve strength and balance in order to decrease fall risk and improve function at home and work.    Time  2    Period  Weeks    Status  New        PT Long Term Goals - 07/19/17 1525      PT LONG TERM GOAL #1   Title  Pt will be able to comfortably stand unsupported without N/T for 20 minutes to complete ADLs    Baseline  2/26: Pt able to stand for 5-10 min before experiencing N/T or unsteadiness    Time  8    Period  Weeks    Status  New      PT LONG TERM GOAL #2   Title  Pt will decrease 5TSTS by at least 3 seconds in order to demonstrate clinically significant improvement in LE strength    Baseline  2/26: 27sec w/ UE support on bilat thighs    Time  8    Period  Weeks    Status  New      PT LONG TERM GOAL #3   Title  Pt will increase by at least 3m (168ft) in order to demonstrate clinically significant improvement in cardiopulmonary endurance and community ambulation    Baseline  2/26: 97ft w/o AD    Time  9    Period  Weeks    Status  New      PT LONG TERM GOAL #4   Title  Patient will increase gross hip strength to 4/5 bilat in order to complete safe squatting and stooping    Baseline  2/26: hip flexion: 4/5 bilat; hip ext: 3+5 bilat; Hip IR 3+/5 bilat; Hip ER 3+/5; Hip abd L4/5 R4-/5    Time  8    Period  Weeks    Status  New             Plan - 07/19/17 1510    Clinical Impression Statement  Pt is a 67 year old male s/p lumbar fusion L4-5 06/22/17 following longstanding spondylolysis and stenosis w/ LE numbness. Current activity limitations in bending, prolonged standing and walking, and squatting/kneeling at work. Impairments  including core and glute weakness, decreased walking endurance, sensation of knee buckling d/t weakness/decreased endurance, and limited trunk ROM. 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 Pt will benefit from skilled PT intervention to address the aforementioned impairments and activity limitations for best return to PLOF.    History and Personal Factors relevant to plan of care:  2 personal factors/comorbidities, 3 body systems/activity limitations/participation restrictions     Clinical Presentation  Evolving    Clinical Presentation due to:  Objective tests and measures    Rehab Potential  Fair    Clinical Impairments Affecting Rehab Potential  Negative- medical comorbidities Positives- motivated     PT Frequency  2x / week    PT Duration  8 weeks    PT Treatment/Interventions  Aquatic Therapy;Gait training;Stair training;Therapeutic activities;Therapeutic exercise;Balance training;Manual techniques;Energy conservation;Functional mobility training;Moist Heat;Cryotherapy;ADLs/Self Care Home Management;Neuromuscular re-education;Passive range of motion;Taping;Patient/family education    PT Next Visit Plan  Balance assessment; advanced medical directive; review HEP and goals    PT Home Exercise Plan  Walking at a time, posterior pelvic tilts, STS    Consulted and Agree with Plan of Care  Patient  Patient will benefit from skilled therapeutic intervention in order to improve the following deficits and impairments:  Abnormal gait, Decreased balance, Decreased activity tolerance, Decreased endurance, Difficulty walking, Impaired sensation, Improper body mechanics, Decreased mobility, Decreased strength, Impaired flexibility  Visit Diagnosis: Muscle weakness (generalized)  Difficulty in walking, not elsewhere classified     Problem List Patient Active Problem List    Diagnosis Date Noted  . Spondylolisthesis of lumbar region 06/22/2017  . Visit for monitoring Tikosyn therapy 03/29/2017  . Hip pain 05/24/2015   Staci Acosta PT, DPT Staci Acosta 07/19/2017, 3:39 PM  [place] [facility] SPORTS MEDICINE [address], [place], [address] Phone: [phone]   Fax:  [phone]  Name: Luke Morgan MRN: [id number] Date of Birth: [birth year]

## 2017-07-20 ENCOUNTER — Ambulatory Visit: Payer: Medicare Other | Admitting: Physical Therapy

## 2017-07-20 NOTE — Therapy (Deleted)
[place] [facility] [address] [place], [address] Phone: [phone]   Fax:  [phone]  Physical Therapy Evaluation  Patient Details  Name: Luke Morgan MRN: [id number] Date of Birth: December 05, 1950 Referring Provider: Lovell Sheehan   Encounter Date: 07/19/2017  PT End of Session - 07/19/17 1505    Visit Number  1    Number of Visits  17    Date for PT Re-Evaluation  09/13/17    PT Start Time  0155    PT Stop Time  0256    PT Time Calculation (min)  61 min    Activity Tolerance  Patient tolerated treatment well    Behavior During Therapy  [facility] for tasks assessed/performed       Past Medical History:  Diagnosis Date  . Asthma   . Atrial fibrillation (HCC)   . History of kidney stones   . Hypertension   . Kidney stones   . Obesity   . Osteoarthritis   . Pre-diabetes    patient is taking metformin  . Sleep apnea   . Spondylolisthesis of lumbar region     Past Surgical History:  Procedure Laterality Date  . CARDIOVERSION N/A 12/24/2016   Procedure: CARDIOVERSION;  Surgeon: Antonieta Iba, MD;  Location: [facility] ORS;  Service: Cardiovascular;  Laterality: N/A;  . CARDIOVERSION N/A 01/04/2017   Procedure: CARDIOVERSION;  Surgeon: Duke Salvia, MD;  Location: [facility] ENDOSCOPY;  Service: Cardiovascular;  Laterality: N/A;  . CARDIOVERSION N/A 03/31/2017   Procedure: CARDIOVERSION;  Surgeon: Laurey Morale, MD;  Location: [facility] ENDOSCOPY;  Service: Cardiovascular;  Laterality: N/A;  . COLONOSCOPY    . LUMBAR FUSION    . TOOTH EXTRACTION      There were no vitals filed for this visit.   Subjective Assessment - 07/19/17 1411    Subjective  s/p 1/30 posterior lumbar fusion (L4-5) following longstanding spondylolysis and stenosis    Pertinent History  Patient is a pleasant 67 y/o male presenting s/p 1/30 posterior lumbar fusion (L4-5) following longstanding spondylolysis and stenosis. Patient ambulates w/ SPC and  lumbar brace in the community, reports he does not use them at home. Patient reports that prior to sx he had numbness and tingling down both LE for years, that have now subsided post surgically. Patient reports no pain in the low back today and 0/10 pain throughout the past week. Patient is only taking tylenol at this time. Patient is wearing a lumbar brace that he reports wearing when he goes out in the community. Patient reports he is currently on lifting restrictions >5lbs and excessive ROM for unspecified time (patient is under the impression this is open to PT). Pt reports post surgically his knees have been increasingly bothering him after longstanding chronic pain, and that with prolonged sitting he feels as though he has to have his cane in order to stand up and walk safely. Patient reports that he feels unsteady walking, but that has become better since his sensation has increased post surgicall, but he feels as though his LEs will "give out".     How long can you sit comfortably?  No limitations    How long can you stand comfortably?  10 minutes    Diagnostic tests  See MRI report, L4-L5 fusion    Patient Stated Goals  Be able to stand and walk for longer durations of time- would like to be able to do so w/o AD; would like to  be able to perform in play this Summer    Currently in Pain?  No/denies                 Objective measurements completed on examination: See above findings.              PT Education - 07/19/17 1502    Education provided  Yes    Education Details  Patient was educated on diagnosis, anatomy and pathology involved, prognosis, role of PT, and was given an HEP, demonstrating exercise with proper form following verbal and tactile cues, and was given a paper hand out to continue exercise at home. Pt was educated on and agreed to plan of care.    Person(s) Educated  Patient    Methods  Explanation;Demonstration;Tactile cues;Verbal cues;Handout     Comprehension  Verbalized understanding;Tactile cues required;Returned demonstration;Verbal cues required       PT Short Term Goals - 07/19/17 1524      PT SHORT TERM GOAL #1   Title  Pt will be independent with HEP in order to improve strength and balance in order to decrease fall risk and improve function at home and work.    Time  2    Period  Weeks    Status  New        PT Long Term Goals - 07/19/17 1525      PT LONG TERM GOAL #1   Title  Pt will be able to comfortably stand unsupported without N/T for 20 minutes to complete ADLs    Baseline  2/26: Pt able to stand for 5-10 min before experiencing N/T or unsteadiness    Time  8    Period  Weeks    Status  New      PT LONG TERM GOAL #2   Title  Pt will decrease 5TSTS by at least 3 seconds in order to demonstrate clinically significant improvement in LE strength    Baseline  2/26: 27sec w/ UE support on bilat thighs    Time  8    Period  Weeks    Status  New      PT LONG TERM GOAL #3   Title  Pt will increase by at least 17m (123ft) in order to demonstrate clinically significant improvement in cardiopulmonary endurance and community ambulation    Baseline  2/26: 974ft w/o AD    Time  9    Period  Weeks    Status  New      PT LONG TERM GOAL #4   Title  Patient will increase gross hip strength to 4/5 bilat in order to complete safe squatting and stooping    Baseline  2/26: hip flexion: 4/5 bilat; hip ext: 3+5 bilat; Hip IR 3+/5 bilat; Hip ER 3+/5; Hip abd L4/5 R4-/5    Time  8    Period  Weeks    Status  New             Plan - 07/19/17 1510    Clinical Impression Statement  Pt is a 67 year old male s/p lumbar fusion L4-5 06/22/17 following longstanding spondylolysis and stenosis w/ LE numbness. Current activity limitations in bending, prolonged standing and walking, and squatting/kneeling at work. Impairments including core and glute weakness, decreased walking endurance, sensation of knee buckling d/t  weakness/decreased endurance, and limited trunk ROM. 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 Pt will benefit  from skilled PT intervention to address the aforementioned impairments and activity limitations for best return to PLOF.    History and Personal Factors relevant to plan of care:  2 personal factors/comorbidities, 3 body systems/activity limitations/participation restrictions     Clinical Presentation  Evolving    Clinical Presentation due to:  Objective tests and measures    Rehab Potential  Fair    Clinical Impairments Affecting Rehab Potential  Negative- medical comorbidities Positives- motivated     PT Frequency  2x / week    PT Duration  8 weeks    PT Treatment/Interventions  Aquatic Therapy;Gait training;Stair training;Therapeutic activities;Therapeutic exercise;Balance training;Manual techniques;Energy conservation;Functional mobility training;Moist Heat;Cryotherapy;ADLs/Self Care Home Management;Neuromuscular re-education;Passive range of motion;Taping;Patient/family education    PT Next Visit Plan  Balance assessment; advanced medical directive; review HEP and goals    PT Home Exercise Plan  Walking at a time, posterior pelvic tilts, STS    Consulted and Agree with Plan of Care  Patient       Patient will benefit from skilled therapeutic intervention in order to improve the following deficits and impairments:  Abnormal gait, Decreased balance, Decreased activity tolerance, Decreased endurance, Difficulty walking, Impaired sensation, Improper body mechanics, Decreased mobility, Decreased strength, Impaired flexibility  Visit Diagnosis: Muscle weakness (generalized) - Plan: PT plan of care cert/re-cert  Difficulty in walking, not elsewhere classified - Plan: PT plan of care cert/re-cert     Problem List Patient Active Problem List   Diagnosis Date Noted   . Spondylolisthesis of lumbar region 06/22/2017  . Visit for monitoring Tikosyn therapy 03/29/2017  . Hip pain 05/24/2015    Staci Acosta 07/20/2017, 10:57 AM  [place] [facility] PHYSICAL AND SPORTS MEDICINE [address], [place], [address] Phone: [phone]   Fax:  [phone]  Name: HANDY MCLOUD MRN: [id number] Date of Birth: Mar 15, 1951

## 2017-07-25 ENCOUNTER — Encounter: Payer: Self-pay | Admitting: Physical Therapy

## 2017-07-25 ENCOUNTER — Ambulatory Visit: Payer: Medicare Other | Attending: Internal Medicine | Admitting: Physical Therapy

## 2017-07-25 DIAGNOSIS — M6281 Muscle weakness (generalized): Secondary | ICD-10-CM | POA: Insufficient documentation

## 2017-07-25 DIAGNOSIS — R262 Difficulty in walking, not elsewhere classified: Secondary | ICD-10-CM | POA: Diagnosis present

## 2017-07-25 NOTE — Therapy (Signed)
[place] [facility] PHYS[facility] SPORTS MEDICINE [address] [place], [address] Phone: [phone]   Fax:  [phone]  Physical Therapy Treatment  Patient Details  Name: Luke Morgan MRN: [id number] Date of Birth: May 07, 1951 Referring Provider: Lovell Sheehan   Encounter Date: 07/25/2017  PT End of Session - 07/25/17 1614    Visit Number  2    Number of Visits  17    Date for PT Re-Evaluation  09/13/17    PT Start Time  0400    PT Stop Time  0445    PT Time Calculation (min)  45 min    Activity Tolerance  Patient tolerated treatment well;Patient limited by fatigue;Treatment limited secondary to medical complications (Comment)    Behavior During Therapy  [facility] for tasks assessed/performed       Past Medical History:  Diagnosis Date  . Asthma   . Atrial fibrillation (HCC)   . History of kidney stones   . Hypertension   . Kidney stones   . Obesity   . Osteoarthritis   . Pre-diabetes    patient is taking metformin  . Sleep apnea   . Spondylolisthesis of lumbar region     Past Surgical History:  Procedure Laterality Date  . CARDIOVERSION N/A 12/24/2016   Procedure: CARDIOVERSION;  Surgeon: Antonieta Iba, MD;  Location: [facility] ORS;  Service: Cardiovascular;  Laterality: N/A;  . CARDIOVERSION N/A 01/04/2017   Procedure: CARDIOVERSION;  Surgeon: Duke Salvia, MD;  Location: [facility] ENDOSCOPY;  Service: Cardiovascular;  Laterality: N/A;  . CARDIOVERSION N/A 03/31/2017   Procedure: CARDIOVERSION;  Surgeon: Laurey Morale, MD;  Location: [facility] ENDOSCOPY;  Service: Cardiovascular;  Laterality: N/A;  . COLONOSCOPY    . LUMBAR FUSION    . TOOTH EXTRACTION      There were no vitals filed for this visit.  Subjective Assessment - 07/25/17 1606    Subjective  Pt reports some pain in bilat knees following rainy weather over the weekend, but reports no pain in the LB. Patient reports bilat knee pain 8/10 in the morning, that decreases to a 2/10  throughout the day. Patient reports no compliance with his HEP, just reporting that he "had not gotten around it".     Pertinent History  Patient is a pleasant 67 y/o male presenting s/p 1/30 posterior lumbar fusion (L4-5) following longstanding spondylolysis and stenosis. Patient ambulates w/ SPC and lumbar brace in the community, reports he does not use them at home. Patient reports that prior to sx he had numbness and tingling down both LE for years, that have now subsided post surgically. Patient reports no pain in the low back today and 0/10 pain throughout the past week. Patient is only taking tylenol at this time. Patient is wearing a lumbar brace that he reports wearing when he goes out in the community. Patient reports he is currently on lifting restrictions >5lbs and excessive ROM for unspecified time (patient is under the impression this is open to PT). Pt reports post surgically his knees have been increasingly bothering him after longstanding chronic pain, and that with prolonged sitting he feels as though he has to have his cane in order to stand up and walk safely. Patient reports that he feels unsteady walking, but that has become better since his sensation has increased post surgicall, but he feels as though his LEs will "give out".     How long can you sit comfortably?  No limitations  How long can you stand comfortably?  10 minutes    Diagnostic tests  See MRI report, L4-L5 fusion    Patient Stated Goals  Be able to stand and walk for longer durations of time- would like to be able to do so w/o AD; would like to be able to perform in play this Summer    Currently in Pain?  No/denies    Multiple Pain Sites  No            Ther-Ex -walking warm-up 4 mins during hx intake (not billed) -Standing hip abd 3x 10e with UE support and cuing for eccentric control and posture/pelvic alignment -Standing hip ext 3x 10e w/ UE support and cuing for proper posture without LB ext  compensation -Heel raises 1x 10 2x 12 with UE bar support and cuing for eccentric lowering 50-75% full ROM -Mini-squat at treadmill bar w/ UE support 2x 10 (patient unable to tolerate a -OMEGA leg press 1x 10 patient unable to tolerate chair sitting so discontinued -Total gym leg press lowered to level 19 2x 10 w/ cuing for eccentric lowering -Clamshell yellow tband 3x 10 with cuing for pelvic alignment and eccentric lowering -Seated hamstring stretch 2x 30sec each LE -Standing hip flexor stretch 3x 30sec each LE                   PT Education - 07/25/17 1613    Education provided  Yes    Education Details  HEP reinforcement; exercise form    Person(s) Educated  Patient    Methods  Explanation;Demonstration;Tactile cues;Verbal cues    Comprehension  Verbal cues required;Returned demonstration;Verbalized understanding;Tactile cues required;Need further instruction       PT Short Term Goals - 07/19/17 1524      PT SHORT TERM GOAL #1   Title  Pt will be independent with HEP in order to improve strength and balance in order to decrease fall risk and improve function at home and work.    Time  2    Period  Weeks    Status  New        PT Long Term Goals - 07/19/17 1525      PT LONG TERM GOAL #1   Title  Pt will be able to comfortably stand unsupported without N/T for 20 minutes to complete ADLs    Baseline  2/26: Pt able to stand for 5-10 min before experiencing N/T or unsteadiness    Time  8    Period  Weeks    Status  New      PT LONG TERM GOAL #2   Title  Pt will decrease 5TSTS by at least 3 seconds in order to demonstrate clinically significant improvement in LE strength    Baseline  2/26: 27sec w/ UE support on bilat thighs    Time  8    Period  Weeks    Status  New      PT LONG TERM GOAL #3   Title  Pt will increase by at least 42m (154ft) in order to demonstrate clinically significant improvement in cardiopulmonary endurance and community  ambulation    Baseline  2/26: 984ft w/o AD    Time  9    Period  Weeks    Status  New      PT LONG TERM GOAL #4   Title  Patient will increase gross hip strength to 4/5 bilat in order to complete safe squatting and stooping  Baseline  2/26: hip flexion: 4/5 bilat; hip ext: 3+5 bilat; Hip IR 3+/5 bilat; Hip ER 3+/5; Hip abd L4/5 R4-/5    Time  8    Period  Weeks    Status  New            Plan - 07/25/17 1642    Clinical Impression Statement  Pt was very fatigued following all therex activity today. Patient did not report any increased pain in the knees or low back, but reported he would "feel it later" and PT advised him in cryotherapy for soreness. PT discussed pool therapy with patient, and decided to try 2x/week on "land" for a few weeks to determine if pool therapy is necessary. PT encouraged HEP for PT maintenance     PT Treatment/Interventions  Aquatic Therapy;Gait training;Stair training;Therapeutic activities;Therapeutic exercise;Balance training;Manual techniques;Energy conservation;Functional mobility training;Moist Heat;Cryotherapy;ADLs/Self Care Home Management;Neuromuscular re-education;Passive range of motion;Taping;Patient/family education    PT Next Visit Plan  Balance assessment; advanced medical directive; review HEP and goals    PT Home Exercise Plan  Walking at a time, posterior pelvic tilts, STS    Consulted and Agree with Plan of Care  Patient       Patient will benefit from skilled therapeutic intervention in order to improve the following deficits and impairments:     Visit Diagnosis: Muscle weakness (generalized)     Problem List Patient Active Problem List   Diagnosis Date Noted  . Spondylolisthesis of lumbar region 06/22/2017  . Visit for monitoring Tikosyn therapy 03/29/2017  . Hip pain 05/24/2015    Staci Acosta 07/25/2017, 5:22 PM  [place] [facility] PHYSICAL AND SPORTS MEDICINE [address] [place], [address] Phone: [phone]   Fax:  [phone]  Name: Luke Morgan MRN: [id number] Date of Birth: Jun 17, 1950

## 2017-07-28 ENCOUNTER — Ambulatory Visit: Payer: Medicare Other | Admitting: Physical Therapy

## 2017-07-28 DIAGNOSIS — M6281 Muscle weakness (generalized): Secondary | ICD-10-CM | POA: Diagnosis not present

## 2017-07-28 NOTE — Therapy (Signed)
[place] [facility] PHYSICAL [facility] SPORTS MEDICINE [address] [place], [address] Phone: [phone]   Fax:  [phone]  Physical Therapy Treatment  Patient Details  Name: Luke Morgan MRN: [id number] Date of Birth: 1951/02/02 Referring Provider: Lovell Sheehan   Encounter Date: 07/28/2017    Past Medical History:  Diagnosis Date  . Asthma   . Atrial fibrillation (HCC)   . History of kidney stones   . Hypertension   . Kidney stones   . Obesity   . Osteoarthritis   . Pre-diabetes    patient is taking metformin  . Sleep apnea   . Spondylolisthesis of lumbar region     Past Surgical History:  Procedure Laterality Date  . CARDIOVERSION N/A 12/24/2016   Procedure: CARDIOVERSION;  Surgeon: Antonieta Iba, MD;  Location: [facility] ORS;  Service: Cardiovascular;  Laterality: N/A;  . CARDIOVERSION N/A 01/04/2017   Procedure: CARDIOVERSION;  Surgeon: Duke Salvia, MD;  Location: [facility] ENDOSCOPY;  Service: Cardiovascular;  Laterality: N/A;  . CARDIOVERSION N/A 03/31/2017   Procedure: CARDIOVERSION;  Surgeon: Laurey Morale, MD;  Location: [facility] ENDOSCOPY;  Service: Cardiovascular;  Laterality: N/A;  . COLONOSCOPY    . LUMBAR FUSION    . TOOTH EXTRACTION      There were no vitals filed for this visit.     Ther-Ex -Nustep warm-up 4 mins during hx intake (not billed) -Standing hip abd 3x 12e with UE support and cuing for eccentric control and posture/pelvic alignment -Standing hip ext 3x 12e w/ UE support and cuing for proper posture without LB ext compensation -Heel raises 3x 12 with UE bar support and cuing for eccentric lowering 50-75% full ROM -Mini-squat at treadmill bar w/ UE support 3x 10 with cuing for glute contraction w/ full hip ext -SAQ w/ 7.5# ankle wt 3x 12e w/ cuing for quad contraction w/ 3sec hold in ext -Standing  hamstring curl 7.5# 3x 10e LE                            PT Short Term Goals - 07/19/17  1524      PT SHORT TERM GOAL #1   Title  Pt will be independent with HEP in order to improve strength and balance in order to decrease fall risk and improve function at home and work.    Time  2    Period  Weeks    Status  New        PT Long Term Goals - 07/19/17 1525      PT LONG TERM GOAL #1   Title  Pt will be able to comfortably stand unsupported without N/T for 20 minutes to complete ADLs    Baseline  2/26: Pt able to stand for 5-10 min before experiencing N/T or unsteadiness    Time  8    Period  Weeks    Status  New      PT LONG TERM GOAL #2   Title  Pt will decrease 5TSTS by at least 3 seconds in order to demonstrate clinically significant improvement in LE strength    Baseline  2/26: 27sec w/ UE support on bilat thighs    Time  8    Period  Weeks    Status  New      PT LONG TERM GOAL #3   Title  Pt will increase by at least 29m (157ft) in order to demonstrate clinically  significant improvement in cardiopulmonary endurance and community ambulation    Baseline  2/26: 983ft w/o AD    Time  9    Period  Weeks    Status  New      PT LONG TERM GOAL #4   Title  Patient will increase gross hip strength to 4/5 bilat in order to complete safe squatting and stooping    Baseline  2/26: hip flexion: 4/5 bilat; hip ext: 3+5 bilat; Hip IR 3+/5 bilat; Hip ER 3+/5; Hip abd L4/5 R4-/5    Time  8    Period  Weeks    Status  New              Patient will benefit from skilled therapeutic intervention in order to improve the following deficits and impairments:     Visit Diagnosis: No diagnosis found.     Problem List Patient Active Problem List   Diagnosis Date Noted  . Spondylolisthesis of lumbar region 06/22/2017  . Visit for monitoring Tikosyn therapy 03/29/2017  . Hip pain 05/24/2015   Staci Acosta PT, DPT Staci Acosta 07/28/2017, 9:06 AM  [place] [facility] PHYSICAL AND SPORTS MEDICINE [address], [place],  [address] Phone: [phone]   Fax:  [phone]  Name: Luke Morgan MRN: [id number] Date of Birth: 06/24/1950

## 2017-08-01 ENCOUNTER — Ambulatory Visit: Payer: Medicare Other | Admitting: Physical Therapy

## 2017-08-02 ENCOUNTER — Encounter: Payer: Self-pay | Admitting: Physical Therapy

## 2017-08-02 ENCOUNTER — Ambulatory Visit: Payer: Medicare Other | Admitting: Physical Therapy

## 2017-08-02 DIAGNOSIS — M6281 Muscle weakness (generalized): Secondary | ICD-10-CM | POA: Diagnosis not present

## 2017-08-02 NOTE — Therapy (Signed)
[place] [facility] PH[facility]ICINE [address] [place], [address] Phone: [phone]   Fax:  [phone]  Physical Therapy Treatment  Patient Details  Name: Luke Morgan MRN: [id number] Date of Birth: [birth year] Referring Provider: Lovell Sheehan   Encounter Date: 08/02/2017  PT End of Session - 08/02/17 1221    Visit Number  4    Number of Visits  17    Date for PT Re-Evaluation  09/13/17    PT Start Time  1116    PT Stop Time  1201    PT Time Calculation (min)  45 min    Activity Tolerance  Patient tolerated treatment well;Patient limited by fatigue    Behavior During Therapy  [facility] for tasks assessed/performed       Past Medical History:  Diagnosis Date  . Asthma   . Atrial fibrillation (HCC)   . History of kidney stones   . Hypertension   . Kidney stones   . Obesity   . Osteoarthritis   . Pre-diabetes    patient is taking metformin  . Sleep apnea   . Spondylolisthesis of lumbar region     Past Surgical History:  Procedure Laterality Date  . CARDIOVERSION N/A 12/24/2016   Procedure: CARDIOVERSION;  Surgeon: Antonieta Iba, MD;  Location: [facility] ORS;  Service: Cardiovascular;  Laterality: N/A;  . CARDIOVERSION N/A 01/04/2017   Procedure: CARDIOVERSION;  Surgeon: Duke Salvia, MD;  Location: [facility] ENDOSCOPY;  Service: Cardiovascular;  Laterality: N/A;  . CARDIOVERSION N/A 03/31/2017   Procedure: CARDIOVERSION;  Surgeon: Laurey Morale, MD;  Location: [facility] ENDOSCOPY;  Service: Cardiovascular;  Laterality: N/A;  . COLONOSCOPY    . LUMBAR FUSION    . TOOTH EXTRACTION      There were no vitals filed for this visit.  Subjective Assessment - 08/02/17 1125    Subjective  Pt reports min pain over the weekend, which he reports is d/t weather, 7/10 with transitional movements. Patient reports he has not done his HEP, but is trying to walk a little more. Patient reports 3/10 pain today.    Pertinent History  Patient is a  pleasant 67 y/o male presenting s/p 1/30 posterior lumbar fusion (L4-5) following longstanding spondylolysis and stenosis. Patient ambulates w/ SPC and lumbar brace in the community, reports he does not use them at home. Patient reports that prior to sx he had numbness and tingling down both LE for years, that have now subsided post surgically. Patient reports no pain in the low back today and 0/10 pain throughout the past week. Patient is only taking tylenol at this time. Patient is wearing a lumbar brace that he reports wearing when he goes out in the community. Patient reports he is currently on lifting restrictions >5lbs and excessive ROM for unspecified time (patient is under the impression this is open to PT). Pt reports post surgically his knees have been increasingly bothering him after longstanding chronic pain, and that with prolonged sitting he feels as though he has to have his cane in order to stand up and walk safely. Patient reports that he feels unsteady walking, but that has become better since his sensation has increased post surgicall, but he feels as though his LEs will "give out".     Limitations  Standing    How long can you sit comfortably?  No limitations    How long can you stand comfortably?  10 minutes    Diagnostic tests  See  MRI report, L4-L5 fusion    Patient Stated Goals  Be able to stand and walk for longer durations of time- would like to be able to do so w/o AD; would like to be able to perform in play this Summer           Ther-Ex -Nustep warm-up 4 mins during hx intake (not billed) -Standing hip abd 3x 10e (with 2nd set only able to do 9 reps) with 2 finger UE support on large dina disc and cuing for eccentric control and posture/pelvic alignment without rotation compenstaion -Standing hip ext 3x 10e  w/ 2 finger support on large dina disc, w/ consistent cuing for proper posture and eccentric control -Heel raises 3x 12 with 2 finger UE bar support airex pad on and  cuing for eccentric lowering with counting to "3" 50-75% full ROM -SLR 3x 12e w/ cuing for quad contraction -Bridge exercise 3x 12 with cuing for glute contraction without back ext compensation -Patient unable to complete lateral step down exercise (even with just very minimal ROM) safely, with proper form (so discontinued)   BP (taken manually on R UE) 152/79 after heel raise exercise taken 4 min later = 132/78                 PT Education - 08/02/17 1220    Education provided  Yes    Education Details  Exercise form    Person(s) Educated  Patient    Methods  Explanation;Demonstration;Tactile cues;Verbal cues    Comprehension  Verbal cues required;Need further instruction;Returned demonstration;Verbalized understanding;Tactile cues required       PT Short Term Goals - 07/19/17 1524      PT SHORT TERM GOAL #1   Title  Pt will be independent with HEP in order to improve strength and balance in order to decrease fall risk and improve function at home and work.    Time  2    Period  Weeks    Status  New        PT Long Term Goals - 07/19/17 1525      PT LONG TERM GOAL #1   Title  Pt will be able to comfortably stand unsupported without N/T for 20 minutes to complete ADLs    Baseline  2/26: Pt able to stand for 5-10 min before experiencing N/T or unsteadiness    Time  8    Period  Weeks    Status  New      PT LONG TERM GOAL #2   Title  Pt will decrease 5TSTS by at least 3 seconds in order to demonstrate clinically significant improvement in LE strength    Baseline  2/26: 27sec w/ UE support on bilat thighs    Time  8    Period  Weeks    Status  New      PT LONG TERM GOAL #3   Title  Pt will increase by at least 24m (172ft) in order to demonstrate clinically significant improvement in cardiopulmonary endurance and community ambulation    Baseline  2/26: 9103ft w/o AD    Time  9    Period  Weeks    Status  New      PT LONG TERM GOAL #4   Title  Patient  will increase gross hip strength to 4/5 bilat in order to complete safe squatting and stooping    Baseline  2/26: hip flexion: 4/5 bilat; hip ext: 3+5 bilat; Hip IR 3+/5 bilat; Hip  ER 3+/5; Hip abd L4/5 R4-/5    Time  8    Period  Weeks    Status  New            Plan - 08/02/17 1223    Clinical Impression Statement  Patient is tolerating therex progression well with more noted fatigue. Adding airex pad into standing hip exercises required prolonged rest between sets, with patient reporting fatigue and SOB, vitals were monitored and stable. Patient continues to have pain standing from chair and stands with flexed posture and gower's UE use, and PT continues to encourage patient to use glutes for standing hip extension. PT is also still continually cuing patient for eccentric control with most exercises. PT asked patient if the exercises are too challenging to perform at proper speed and he reported they were not.      Clinical Impairments Affecting Rehab Potential  Negative- medical comorbidities Positives- motivated     PT Frequency  2x / week    PT Duration  8 weeks    PT Treatment/Interventions  Aquatic Therapy;Gait training;Stair training;Therapeutic activities;Therapeutic exercise;Balance training;Manual techniques;Energy conservation;Functional mobility training;Moist Heat;Cryotherapy;ADLs/Self Care Home Management;Neuromuscular re-education;Passive range of motion;Taping;Patient/family education    PT Next Visit Plan  continue strengthening with hip ext focus    PT Home Exercise Plan  Walking at a time, posterior pelvic tilts, STS    Consulted and Agree with Plan of Care  Patient       Patient will benefit from skilled therapeutic intervention in order to improve the following deficits and impairments:  Abnormal gait, Decreased balance, Decreased activity tolerance, Decreased endurance, Difficulty walking, Impaired sensation, Improper body mechanics, Decreased mobility, Decreased  strength, Impaired flexibility  Visit Diagnosis: Muscle weakness (generalized)     Problem List Patient Active Problem List   Diagnosis Date Noted  . Spondylolisthesis of lumbar region 06/22/2017  . Visit for monitoring Tikosyn therapy 03/29/2017  . Hip pain 05/24/2015   Staci Acosta PT, DPT Staci Acosta 08/02/2017, 12:40 PM  [place] [facility] PHYSICAL AND SPORTS MEDICINE [address] [place], [address] Phone: [phone]   Fax:  [phone]  Name: DAMARIOUS HOLTSCLAW MRN: [id number] Date of Birth: 1950/09/14

## 2017-08-05 ENCOUNTER — Ambulatory Visit: Payer: Medicare Other

## 2017-08-05 DIAGNOSIS — R262 Difficulty in walking, not elsewhere classified: Secondary | ICD-10-CM

## 2017-08-05 DIAGNOSIS — M6281 Muscle weakness (generalized): Secondary | ICD-10-CM

## 2017-08-05 NOTE — Therapy (Signed)
[place] [facility]TS MEDICINE [address] [place], [address] Phone: [phone]   Fax:  [phone]  Physical Therapy Treatment  Patient Details  Name: Luke Morgan MRN: [id number] Date of Birth: 09-20-1950 Referring Provider: Lovell Sheehan   Encounter Date: 08/05/2017  PT End of Session - 08/05/17 1053    Visit Number  5    Number of Visits  17    Date for PT Re-Evaluation  09/13/17    PT Start Time  1048    PT Stop Time  1130    PT Time Calculation (min)  42 min    Activity Tolerance  Patient tolerated treatment well;Patient limited by fatigue    Behavior During Therapy  [facility] for tasks assessed/performed       Past Medical History:  Diagnosis Date  . Asthma   . Atrial fibrillation (HCC)   . History of kidney stones   . Hypertension   . Kidney stones   . Obesity   . Osteoarthritis   . Pre-diabetes    patient is taking metformin  . Sleep apnea   . Spondylolisthesis of lumbar region     Past Surgical History:  Procedure Laterality Date  . CARDIOVERSION N/A 12/24/2016   Procedure: CARDIOVERSION;  Surgeon: Antonieta Iba, MD;  Location: [facility] ORS;  Service: Cardiovascular;  Laterality: N/A;  . CARDIOVERSION N/A 01/04/2017   Procedure: CARDIOVERSION;  Surgeon: Duke Salvia, MD;  Location: [facility] ENDOSCOPY;  Service: Cardiovascular;  Laterality: N/A;  . CARDIOVERSION N/A 03/31/2017   Procedure: CARDIOVERSION;  Surgeon: Laurey Morale, MD;  Location: [facility] ENDOSCOPY;  Service: Cardiovascular;  Laterality: N/A;  . COLONOSCOPY    . LUMBAR FUSION    . TOOTH EXTRACTION      There were no vitals filed for this visit.  Subjective Assessment - 08/05/17 1051    Subjective  Denies any low back pain. He reports continued relief of numbness in bilateral LEs since surgery. He complains of 3/10 bilateral knee pain today which is chronic for patient. He is not really performed HEP consistently at home. No specific questions or  concerns at this time.     Pertinent History  Patient is a pleasant 67 y/o male presenting s/p 1/30 posterior lumbar fusion (L4-5) following longstanding spondylolysis and stenosis. Patient ambulates w/ SPC and lumbar brace in the community, reports he does not use them at home. Patient reports that prior to sx he had numbness and tingling down both LE for years, that have now subsided post surgically. Patient reports no pain in the low back today and 0/10 pain throughout the past week. Patient is only taking tylenol at this time. Patient is wearing a lumbar brace that he reports wearing when he goes out in the community. Patient reports he is currently on lifting restrictions >5lbs and excessive ROM for unspecified time (patient is under the impression this is open to PT). Pt reports post surgically his knees have been increasingly bothering him after longstanding chronic pain, and that with prolonged sitting he feels as though he has to have his cane in order to stand up and walk safely. Patient reports that he feels unsteady walking, but that has become better since his sensation has increased post surgicall, but he feels as though his LEs will "give out".     Limitations  Standing    How long can you sit comfortably?  No limitations    How long can you stand comfortably?  10 minutes    Diagnostic tests  See MRI report, L4-L5 fusion    Patient Stated Goals  Be able to stand and walk for longer durations of time- would like to be able to do so w/o AD; would like to be able to perform in play this Summer    Currently in Pain?  Yes    Pain Score  3     Pain Location  Knee    Pain Orientation  Right;Left    Pain Descriptors / Indicators  Aching           TREATMENT  Ther-ex Nustep warm-up L3 x 5 mins during hx intake (2 minutes unbilled);  Standing hip abd 2 x 10 without UE support but CGA from therapist, attempted with dynadisc and then Airex pad but pt unable to perform without UE  support; Slow marching to challenge SLS without UE support x 15 bilateral; Forward step-ups to 4" step alternating leading LE x 10 on each side; R stance on 4" step with controlled L step down x 10, bilateral UE support with 20-50# support as reported by patient. Attempted on LLE but pt unable to perform Heel raises 2 x 15 without UE support; Sit to stand from elevated mat table without UE support 2 x 10; SLR 2 x 10 with cues for quad contraction and abdominal stabilizations; Hooklying pball press down 5s hold x 10;  Intermittent rest breaks provided throughout session due to fatigue;                      PT Education - 08/05/17 1053    Education provided  Yes    Education Details  exercise form/technique    Person(s) Educated  Patient    Methods  Explanation    Comprehension  Verbalized understanding       PT Short Term Goals - 07/19/17 1524      PT SHORT TERM GOAL #1   Title  Pt will be independent with HEP in order to improve strength and balance in order to decrease fall risk and improve function at home and work.    Time  2    Period  Weeks    Status  New        PT Long Term Goals - 07/19/17 1525      PT LONG TERM GOAL #1   Title  Pt will be able to comfortably stand unsupported without N/T for 20 minutes to complete ADLs    Baseline  2/26: Pt able to stand for 5-10 min before experiencing N/T or unsteadiness    Time  8    Period  Weeks    Status  New      PT LONG TERM GOAL #2   Title  Pt will decrease 5TSTS by at least 3 seconds in order to demonstrate clinically significant improvement in LE strength    Baseline  2/26: 27sec w/ UE support on bilat thighs    Time  8    Period  Weeks    Status  New      PT LONG TERM GOAL #3   Title  Pt will increase by at least 24m (181ft) in order to demonstrate clinically significant improvement in cardiopulmonary endurance and community ambulation    Baseline  2/26: 983ft w/o AD    Time  9    Period   Weeks    Status  New      PT LONG TERM GOAL #  4   Title  Patient will increase gross hip strength to 4/5 bilat in order to complete safe squatting and stooping    Baseline  2/26: hip flexion: 4/5 bilat; hip ext: 3+5 bilat; Hip IR 3+/5 bilat; Hip ER 3+/5; Hip abd L4/5 R4-/5    Time  8    Period  Weeks    Status  New            Plan - 08/05/17 1054    Clinical Impression Statement  Pt demonstrates excellent motivation with therapy today. He is struggles with lateral step-downs bilaterally and is completely unable to perform when in L single leg stance. Pt provided intermittent rest breaks during session due to limited endurance. Pt encouraged to perform sit to stands from home and instructed how to build up the seat of a chair to perform at appropriate challenge. Pt encouraged to follow-up as scheduled.    Clinical Impairments Affecting Rehab Potential  Negative- medical comorbidities Positives- motivated     PT Frequency  2x / week    PT Duration  8 weeks    PT Treatment/Interventions  Aquatic Therapy;Gait training;Stair training;Therapeutic activities;Therapeutic exercise;Balance training;Manual techniques;Energy conservation;Functional mobility training;Moist Heat;Cryotherapy;ADLs/Self Care Home Management;Neuromuscular re-education;Passive range of motion;Taping;Patient/family education    PT Next Visit Plan  continue strengthening with hip ext focus    PT Home Exercise Plan  Walking at a time, posterior pelvic tilts, STS    Consulted and Agree with Plan of Care  Patient       Patient will benefit from skilled therapeutic intervention in order to improve the following deficits and impairments:  Abnormal gait, Decreased balance, Decreased activity tolerance, Decreased endurance, Difficulty walking, Impaired sensation, Improper body mechanics, Decreased mobility, Decreased strength, Impaired flexibility  Visit Diagnosis: Muscle weakness (generalized)  Difficulty in walking, not  elsewhere classified     Problem List Patient Active Problem List   Diagnosis Date Noted  . Spondylolisthesis of lumbar region 06/22/2017  . Visit for monitoring Tikosyn therapy 03/29/2017  . Hip pain 05/24/2015   Lynnea Maizes PT, DPT   Noemi Bellissimo 08/05/2017, 11:36 AM  [place] [facility] PHYSICAL AND SPORTS MEDICINE [address], [place], [address] Phone: [phone]   Fax:  [phone]  Name: Luke Morgan MRN: [id number] Date of Birth: 10/05/1950

## 2017-08-08 ENCOUNTER — Encounter: Payer: Medicare Other | Admitting: Physical Therapy

## 2017-08-10 ENCOUNTER — Encounter: Payer: Self-pay | Admitting: Physical Therapy

## 2017-08-10 ENCOUNTER — Ambulatory Visit: Payer: Medicare Other | Admitting: Physical Therapy

## 2017-08-10 DIAGNOSIS — M6281 Muscle weakness (generalized): Secondary | ICD-10-CM | POA: Diagnosis not present

## 2017-08-10 NOTE — Therapy (Signed)
[place] [facility] [address] [place], [address] Phone: [phone]   Fax:  [phone]  Physical Therapy Treatment  Patient Details  Name: Luke Morgan MRN: [id number] Date of Birth: 1951-01-04 Referring Provider: Lovell Sheehan   Encounter Date: 08/10/2017  PT End of Session - 08/10/17 0958    Visit Number  6    Number of Visits  17    Date for PT Re-Evaluation  09/13/17    PT Start Time  0945    PT Stop Time  1030    PT Time Calculation (min)  45 min    Activity Tolerance  Patient tolerated treatment well;Patient limited by fatigue    Behavior During Therapy  [facility] for tasks assessed/performed       Past Medical History:  Diagnosis Date  . Asthma   . Atrial fibrillation (HCC)   . History of kidney stones   . Hypertension   . Kidney stones   . Obesity   . Osteoarthritis   . Pre-diabetes    patient is taking metformin  . Sleep apnea   . Spondylolisthesis of lumbar region     Past Surgical History:  Procedure Laterality Date  . CARDIOVERSION N/A 12/24/2016   Procedure: CARDIOVERSION;  Surgeon: Antonieta Iba, MD;  Location: [facility] ORS;  Service: Cardiovascular;  Laterality: N/A;  . CARDIOVERSION N/A 01/04/2017   Procedure: CARDIOVERSION;  Surgeon: Duke Salvia, MD;  Location: [facility] ENDOSCOPY;  Service: Cardiovascular;  Laterality: N/A;  . CARDIOVERSION N/A 03/31/2017   Procedure: CARDIOVERSION;  Surgeon: Laurey Morale, MD;  Location: [facility] ENDOSCOPY;  Service: Cardiovascular;  Laterality: N/A;  . COLONOSCOPY    . LUMBAR FUSION    . TOOTH EXTRACTION      There were no vitals filed for this visit.  Subjective Assessment - 08/10/17 0947    Subjective  Patient reports that over the weekend he began having back pain that is making it difficult for him to make transitional movement in the morning, that subsides a few minutes after being awake, and is 5/10 but quickly subsides. Patient reports he is  having 3/10 pain in bilat knees today, but reports that he is having much less knee pain with walking. Patient reports he is feeling stronger with walking, but is starting to feel "a little off balance with walking", which he reports Barbara Cower (DPT) explained to him last week is d/t nerve discrepency from surgery. Patient continues to report little compliance with HEP    Pertinent History  Patient is a pleasant 67 y/o male presenting s/p 1/30 posterior lumbar fusion (L4-5) following longstanding spondylolysis and stenosis. Patient ambulates w/ SPC and lumbar brace in the community, reports he does not use them at home. Patient reports that prior to sx he had numbness and tingling down both LE for years, that have now subsided post surgically. Patient reports no pain in the low back today and 0/10 pain throughout the past week. Patient is only taking tylenol at this time. Patient is wearing a lumbar brace that he reports wearing when he goes out in the community. Patient reports he is currently on lifting restrictions >5lbs and excessive ROM for unspecified time (patient is under the impression this is open to PT). Pt reports post surgically his knees have been increasingly bothering him after longstanding chronic pain, and that with prolonged sitting he feels as though he has to have his cane in order to stand up and walk safely.  Patient reports that he feels unsteady walking, but that has become better since his sensation has increased post surgicall, but he feels as though his LEs will "give out".     Limitations  Standing    How long can you sit comfortably?  No limitations    How long can you stand comfortably?  10 minutes    Diagnostic tests  See MRI report, L4-L5 fusion    Patient Stated Goals  Be able to stand and walk for longer durations of time- would like to be able to do so w/o AD; would like to be able to perform in play this Summer       Ther-Ex - Nustep 4 mins for warm up duing history  intake - Marching with 4# ankle weights with unilateral UE support and cuing for eccentric control and proper knee height 2x 10 - Mini squats on aired pad 3x 10 with cuing for full hip ext  - Standing hip abd with yellow tband and bilat UE support for balance 3x 12 with cuing needed for maintaining upright posture - Heel raises 3x 12 on airex pad with bilat 2 finger support with cuing for eccentric control  - Step ups onto 6in step 2x 10 (alternating leading with R LE for 10/L LE for 10) with handrail support needed for leading with L LE and cuing for completing hip ext at top of step  Following therex vitals were obtained at were as followed: 139/79                        PT Education - 08/10/17 0954    Education provided  Yes    Education Details  Exercise form    Person(s) Educated  Patient    Methods  Explanation;Verbal cues;Tactile cues    Comprehension  Verbalized understanding;Verbal cues required;Tactile cues required       PT Short Term Goals - 07/19/17 1524      PT SHORT TERM GOAL #1   Title  Pt will be independent with HEP in order to improve strength and balance in order to decrease fall risk and improve function at home and work.    Time  2    Period  Weeks    Status  New        PT Long Term Goals - 07/19/17 1525      PT LONG TERM GOAL #1   Title  Pt will be able to comfortably stand unsupported without N/T for 20 minutes to complete ADLs    Baseline  2/26: Pt able to stand for 5-10 min before experiencing N/T or unsteadiness    Time  8    Period  Weeks    Status  New      PT LONG TERM GOAL #2   Title  Pt will decrease 5TSTS by at least 3 seconds in order to demonstrate clinically significant improvement in LE strength    Baseline  2/26: 27sec w/ UE support on bilat thighs    Time  8    Period  Weeks    Status  New      PT LONG TERM GOAL #3   Title  Pt will increase by at least 27m (120ft) in order to demonstrate clinically  significant improvement in cardiopulmonary endurance and community ambulation    Baseline  2/26: 961ft w/o AD    Time  9    Period  Weeks    Status  New      PT LONG TERM GOAL #4   Title  Patient will increase gross hip strength to 4/5 bilat in order to complete safe squatting and stooping    Baseline  2/26: hip flexion: 4/5 bilat; hip ext: 3+5 bilat; Hip IR 3+/5 bilat; Hip ER 3+/5; Hip abd L4/5 R4-/5    Time  8    Period  Weeks    Status  New            Plan - 08/10/17 1019    Clinical Impression Statement  Pt is continuing to demonstrate good motivation with PT. Patient remains non-compliant with his HEP and PT continues to encourage exercise as able. Patient is continuing to demonstrate increased difficulty and pain with SLS on L lower extremity. Patient is demonstrating better balance with therex with airex pad, but is still reporting feeling a little unsteady. PT will continue to monitor patient's back pain, although it did not increase or affect therapy session today    Clinical Impairments Affecting Rehab Potential  Negative- medical comorbidities Positives- motivated     PT Frequency  2x / week    PT Duration  8 weeks    PT Treatment/Interventions  Aquatic Therapy;Gait training;Stair training;Therapeutic activities;Therapeutic exercise;Balance training;Manual techniques;Energy conservation;Functional mobility training;Moist Heat;Cryotherapy;ADLs/Self Care Home Management;Neuromuscular re-education;Passive range of motion;Taping;Patient/family education    PT Next Visit Plan  continue strengthening with hip ext focus    PT Home Exercise Plan  Walking at a time, posterior pelvic tilts, STS    Consulted and Agree with Plan of Care  Patient       Patient will benefit from skilled therapeutic intervention in order to improve the following deficits and impairments:  Abnormal gait, Decreased balance, Decreased activity tolerance, Decreased endurance, Difficulty walking, Impaired  sensation, Improper body mechanics, Decreased mobility, Decreased strength, Impaired flexibility  Visit Diagnosis: Muscle weakness (generalized)     Problem List Patient Active Problem List   Diagnosis Date Noted  . Spondylolisthesis of lumbar region 06/22/2017  . Visit for monitoring Tikosyn therapy 03/29/2017  . Hip pain 05/24/2015   Staci Acosta PT, DPT Staci Acosta 08/10/2017, 10:42 AM  [place] [facility] PHYSICAL AND SPORTS MEDICINE [address] [place], [address] Phone: [phone]   Fax:  [phone]  Name: Luke Morgan MRN: [id number] Date of Birth: 26-Sep-1950

## 2017-08-12 ENCOUNTER — Encounter: Payer: Self-pay | Admitting: Physical Therapy

## 2017-08-12 ENCOUNTER — Ambulatory Visit: Payer: Medicare Other | Admitting: Physical Therapy

## 2017-08-12 DIAGNOSIS — M6281 Muscle weakness (generalized): Secondary | ICD-10-CM

## 2017-08-12 NOTE — Therapy (Signed)
[facility] [address], [place], [address] Phone: [phone]   Fax:  [phone]  Physical Therapy Treatment  Patient Details  Name: Luke Morgan MRN: [id number] Date of Birth: [birth year] Referring Provider: Lovell Sheehan   Encounter Date: 08/12/2017  PT End of Session - 08/12/17 0930    Visit Number  7    Number of Visits  17    Date for PT Re-Evaluation  09/13/17    PT Start Time  0915    PT Stop Time  0955    PT Time Calculation (min)  40 min    Activity Tolerance  Patient tolerated treatment well;Patient limited by fatigue    Behavior During Therapy  [facility] for tasks assessed/performed       Past Medical History:  Diagnosis Date  . Asthma   . Atrial fibrillation (HCC)   . History of kidney stones   . Hypertension   . Kidney stones   . Obesity   . Osteoarthritis   . Pre-diabetes    patient is taking metformin  . Sleep apnea   . Spondylolisthesis of lumbar region     Past Surgical History:  Procedure Laterality Date  . CARDIOVERSION N/A 12/24/2016   Procedure: CARDIOVERSION;  Surgeon: Antonieta Iba, MD;  Location: [facility] ORS;  Service: Cardiovascular;  Laterality: N/A;  . CARDIOVERSION N/A 01/04/2017   Procedure: CARDIOVERSION;  Surgeon: Duke Salvia, MD;  Location: [facility] ENDOSCOPY;  Service: Cardiovascular;  Laterality: N/A;  . CARDIOVERSION N/A 03/31/2017   Procedure: CARDIOVERSION;  Surgeon: Laurey Morale, MD;  Location: [facility] ENDOSCOPY;  Service: Cardiovascular;  Laterality: N/A;  . COLONOSCOPY    . LUMBAR FUSION    . TOOTH EXTRACTION      There were no vitals filed for this visit.  Subjective Assessment - 08/12/17 0918    Subjective  Patient reports he is only have 2/10 pain in bilat knees today, but that he feels better today than Wednesday. He reports he has been able to complete "some of " his HEP.    Pertinent History  Patient is a pleasant 67 y/o male presenting s/p 1/30  posterior lumbar fusion (L4-5) following longstanding spondylolysis and stenosis. Patient ambulates w/ SPC and lumbar brace in the community, reports he does not use them at home. Patient reports that prior to sx he had numbness and tingling down both LE for years, that have now subsided post surgically. Patient reports no pain in the low back today and 0/10 pain throughout the past week. Patient is only taking tylenol at this time. Patient is wearing a lumbar brace that he reports wearing when he goes out in the community. Patient reports he is currently on lifting restrictions >5lbs and excessive ROM for unspecified time (patient is under the impression this is open to PT). Pt reports post surgically his knees have been increasingly bothering him after longstanding chronic pain, and that with prolonged sitting he feels as though he has to have his cane in order to stand up and walk safely. Patient reports that he feels unsteady walking, but that has become better since his sensation has increased post surgicall, but he feels as though his LEs will "give out".     Limitations  Standing    How long can you sit comfortably?  No limitations    How long can you stand comfortably?  10 minutes    Diagnostic tests  See MRI report, L4-L5 fusion  Patient Stated Goals  Be able to stand and walk for longer durations of time- would like to be able to do so w/o AD; would like to be able to perform in play this Summer                 Ther-Ex  Nustep 4 mins for warm up duing history intake  Dynamic marching with 5# ankle wts over 26ft with unilateral UE support and cuing for eccentric control and proper knee height 3x (down and back) (patient ambulates with high guard for balance w/ prolonged SLS)  Standing hip ext with yellow tband and bilat UE support for balance 3x 10  Standing hip abd with yellow tband and bilat UE with support for balance 3x 10 with cuing needed for maintaining upright posture  without lateral lean compensation  Patient completed 1 round of bilat hip abd + ext at a time with seated rest breaks between d/t severe fatigue  Standing hamstring curl w/ 5# ankle wts 3x 12 with cuing for hip ext and eccentric control   Step ups onto 6in step 2x 10 (alternating leading with R LE for 10/L LE for 10) with handrail support needed for leading with L LE and cuing for completing hip ext at top of step                             PT Education - 08/12/17 0928    Education provided  Yes    Education Details  Exercise form    Person(s) Educated  Patient    Methods  Explanation;Verbal cues;Tactile cues    Comprehension  Verbalized understanding;Returned demonstration;Verbal cues required       PT Short Term Goals - 07/19/17 1524      PT SHORT TERM GOAL #1   Title  Pt will be independent with HEP in order to improve strength and balance in order to decrease fall risk and improve function at home and work.    Time  2    Period  Weeks    Status  New        PT Long Term Goals - 07/19/17 1525      PT LONG TERM GOAL #1   Title  Pt will be able to comfortably stand unsupported without N/T for 20 minutes to complete ADLs    Baseline  2/26: Pt able to stand for 5-10 min before experiencing N/T or unsteadiness    Time  8    Period  Weeks    Status  New      PT LONG TERM GOAL #2   Title  Pt will decrease 5TSTS by at least 3 seconds in order to demonstrate clinically significant improvement in LE strength    Baseline  2/26: 27sec w/ UE support on bilat thighs    Time  8    Period  Weeks    Status  New      PT LONG TERM GOAL #3   Title  Pt will increase by at least 57m (115ft) in order to demonstrate clinically significant improvement in cardiopulmonary endurance and community ambulation    Baseline  2/26: 964ft w/o AD    Time  9    Period  Weeks    Status  New      PT LONG TERM GOAL #4   Title  Patient will increase gross hip strength to  4/5 bilat in order to complete safe  squatting and stooping    Baseline  2/26: hip flexion: 4/5 bilat; hip ext: 3+5 bilat; Hip IR 3+/5 bilat; Hip ER 3+/5; Hip abd L4/5 R4-/5    Time  8    Period  Weeks    Status  New            Plan - 08/12/17 0940    Clinical Impression Statement  Pt tolerated therex progression well with the progression of dynamic balance activity. Patient uses high gaurd with UE to maintain balance with dynamic marching, but demonstrated no LOB, only increased postural sway . Patient asked PT opinion on TKE surgery, reporting he has "no cartilage" in his L knee. PT left this decision to the discrection of patient and surgeon as this is not technically the reason for referral and PT does not have enough information to properly advise. PT did advise patient that with any surgery, pre-hab to go into surgery is never a bad idea to increase recovery success, so that if he does elect to undergo surgery, PT is still assisting him in increasing success post surgery.    Clinical Impairments Affecting Rehab Potential  Negative- medical comorbidities Positives- motivated     PT Frequency  2x / week    PT Duration  8 weeks    PT Treatment/Interventions  Aquatic Therapy;Gait training;Stair training;Therapeutic activities;Therapeutic exercise;Balance training;Manual techniques;Energy conservation;Functional mobility training;Moist Heat;Cryotherapy;ADLs/Self Care Home Management;Neuromuscular re-education;Passive range of motion;Taping;Patient/family education    PT Next Visit Plan  continue strengthening with hip ext focus    PT Home Exercise Plan  Walking at a time, posterior pelvic tilts, STS    Consulted and Agree with Plan of Care  Patient       Patient will benefit from skilled therapeutic intervention in order to improve the following deficits and impairments:  Abnormal gait, Decreased balance, Decreased activity tolerance, Decreased endurance, Difficulty walking, Impaired  sensation, Improper body mechanics, Decreased mobility, Decreased strength, Impaired flexibility  Visit Diagnosis: Muscle weakness (generalized)     Problem List Patient Active Problem List   Diagnosis Date Noted  . Spondylolisthesis of lumbar region 06/22/2017  . Visit for monitoring Tikosyn therapy 03/29/2017  . Hip pain 05/24/2015   Staci Acosta PT, DPT Staci Acosta 08/12/2017, 9:56 AM  [place] [facility] PHYSICAL AND SPORTS MEDICINE [address] [place], [address] Phone: [phone]   Fax:  [phone]  Name: HOLSTON OYAMA MRN: [id number] Date of Birth: [birth year]

## 2017-08-15 ENCOUNTER — Encounter: Payer: Medicare Other | Admitting: Physical Therapy

## 2017-08-17 ENCOUNTER — Ambulatory Visit: Payer: Medicare Other | Admitting: Physical Therapy

## 2017-08-17 ENCOUNTER — Encounter: Payer: Self-pay | Admitting: Physical Therapy

## 2017-08-17 DIAGNOSIS — M6281 Muscle weakness (generalized): Secondary | ICD-10-CM | POA: Diagnosis not present

## 2017-08-17 NOTE — Therapy (Signed)
[place] [facility] PH[facility]TS MEDICINE [address] [place], [address] Phone: [phone]   Fax:  [phone]  Physical Therapy Treatment  Patient Details  Name: Luke Morgan MRN: [id number] Date of Birth: November 11, 1950 Referring Provider: Lovell Sheehan   Encounter Date: 08/17/2017  PT End of Session - 08/17/17 1051    Visit Number  8    Number of Visits  17    Date for PT Re-Evaluation  09/13/17    PT Start Time  1030    PT Stop Time  1114    PT Time Calculation (min)  44 min    Activity Tolerance  Patient tolerated treatment well;Patient limited by fatigue    Behavior During Therapy  [facility] for tasks assessed/performed       Past Medical History:  Diagnosis Date  . Asthma   . Atrial fibrillation (HCC)   . History of kidney stones   . Hypertension   . Kidney stones   . Obesity   . Osteoarthritis   . Pre-diabetes    patient is taking metformin  . Sleep apnea   . Spondylolisthesis of lumbar region     Past Surgical History:  Procedure Laterality Date  . CARDIOVERSION N/A 12/24/2016   Procedure: CARDIOVERSION;  Surgeon: Antonieta Iba, MD;  Location: [facility] ORS;  Service: Cardiovascular;  Laterality: N/A;  . CARDIOVERSION N/A 01/04/2017   Procedure: CARDIOVERSION;  Surgeon: Duke Salvia, MD;  Location: [facility] ENDOSCOPY;  Service: Cardiovascular;  Laterality: N/A;  . CARDIOVERSION N/A 03/31/2017   Procedure: CARDIOVERSION;  Surgeon: Laurey Morale, MD;  Location: [facility] ENDOSCOPY;  Service: Cardiovascular;  Laterality: N/A;  . COLONOSCOPY    . LUMBAR FUSION    . TOOTH EXTRACTION      There were no vitals filed for this visit.  Subjective Assessment - 08/17/17 1039    Subjective  Patient reports his PCP took him off metformin d/t decreased blood sugar levels, which he is very proud of. Patient reports no knee pain today d/t cortisone injection yesterday. He reports that the L knee is still buckling with steps. Per pt report, Dr.  Lovell Sheehan told him he could remove the back brace. Patient reports no pain in the back today.  Patient continues to report minimal compliance with HEP, with no explanation for why he is unable to complete them.     Pertinent History  Patient is a pleasant 67 y/o male presenting s/p 1/30 posterior lumbar fusion (L4-5) following longstanding spondylolysis and stenosis. Patient ambulates w/ SPC and lumbar brace in the community, reports he does not use them at home. Patient reports that prior to sx he had numbness and tingling down both LE for years, that have now subsided post surgically. Patient reports no pain in the low back today and 0/10 pain throughout the past week. Patient is only taking tylenol at this time. Patient is wearing a lumbar brace that he reports wearing when he goes out in the community. Patient reports he is currently on lifting restrictions >5lbs and excessive ROM for unspecified time (patient is under the impression this is open to PT). Pt reports post surgically his knees have been increasingly bothering him after longstanding chronic pain, and that with prolonged sitting he feels as though he has to have his cane in order to stand up and walk safely. Patient reports that he feels unsteady walking, but that has become better since his sensation has increased post surgicall, but he feels as  though his LEs will "give out".     Limitations  Standing    How long can you sit comfortably?  No limitations    How long can you stand comfortably?  10 minutes    Diagnostic tests  See MRI report, L4-L5 fusion    Patient Stated Goals  Be able to stand and walk for longer durations of time- would like to be able to do so w/o AD; would like to be able to perform in play this Summer            Ther-Ex  Nustep 4 mins for warm up duing history intake  Mini squat exercise 3x 10 with 2 finger support and min cuing from PT to prevent "knees over toes" with more hip flex/ext  Side stepping with  yellow tband at knees 50ft( down and back) 3x  OMEGA leg press 55# 3x 10 with cuing for eccentric control  Heel raises from step 2x 12 with min UE support at handrails for balance  Standing marching w/ 7.5# ankle wts 3x 10 with cuing for hip ext and eccentric control  with patient reporting extreme fatigue following, needing prolonged rest breaks  Step ups onto 6in step 2x 10 (alternating leading with R LE for 10/L LE for 10) with less handrail support needed for leading with L LE and patient reporting less pain from previous session leading with L LE and cuing for completing hip ext at top of step (handrail support for both for balance  BP on R arm at the end of therex 140/78    No data recorded               PT Education - 08/17/17 1050    Education provided  Yes    Education Details  Exercise form    Person(s) Educated  Patient    Methods  Explanation;Demonstration;Tactile cues;Verbal cues    Comprehension  Verbal cues required;Returned demonstration;Verbalized understanding       PT Short Term Goals - 07/19/17 1524      PT SHORT TERM GOAL #1   Title  Pt will be independent with HEP in order to improve strength and balance in order to decrease fall risk and improve function at home and work.    Time  2    Period  Weeks    Status  New        PT Long Term Goals - 07/19/17 1525      PT LONG TERM GOAL #1   Title  Pt will be able to comfortably stand unsupported without N/T for 20 minutes to complete ADLs    Baseline  2/26: Pt able to stand for 5-10 min before experiencing N/T or unsteadiness    Time  8    Period  Weeks    Status  New      PT LONG TERM GOAL #2   Title  Pt will decrease 5TSTS by at least 3 seconds in order to demonstrate clinically significant improvement in LE strength    Baseline  2/26: 27sec w/ UE support on bilat thighs    Time  8    Period  Weeks    Status  New      PT LONG TERM GOAL #3   Title  Pt will increase by at least 55m  (151ft) in order to demonstrate clinically significant improvement in cardiopulmonary endurance and community ambulation    Baseline  2/26: 936ft w/o AD    Time  9    Period  Weeks    Status  New      PT LONG TERM GOAL #4   Title  Patient will increase gross hip strength to 4/5 bilat in order to complete safe squatting and stooping    Baseline  2/26: hip flexion: 4/5 bilat; hip ext: 3+5 bilat; Hip IR 3+/5 bilat; Hip ER 3+/5; Hip abd L4/5 R4-/5    Time  8    Period  Weeks    Status  New            Plan - 08/17/17 1055    Clinical Impression Statement  Pt tolerated therex progression well today with more fatigue noted between sets with increased intensity, with seated rest breaks required between all exercise.  Patient asked PT about aquatic therapy and PT educated patient that because he is progressing well with land exercises and can tolerate progression without pain, he would recieve the most benefit from continuing strengthening/endurance on land as long as he is able to tolerate this.     Clinical Impairments Affecting Rehab Potential  Negative- medical comorbidities Positives- motivated     PT Frequency  2x / week    PT Duration  8 weeks    PT Treatment/Interventions  Aquatic Therapy;Gait training;Stair training;Therapeutic activities;Therapeutic exercise;Balance training;Manual techniques;Energy conservation;Functional mobility training;Moist Heat;Cryotherapy;ADLs/Self Care Home Management;Neuromuscular re-education;Passive range of motion;Taping;Patient/family education    PT Next Visit Plan  continue strengthening with hip ext focus    PT Home Exercise Plan  Walking at a time, posterior pelvic tilts, STS    Consulted and Agree with Plan of Care  Patient       Patient will benefit from skilled therapeutic intervention in order to improve the following deficits and impairments:  Abnormal gait, Decreased balance, Decreased activity tolerance, Decreased endurance, Difficulty  walking, Impaired sensation, Improper body mechanics, Decreased mobility, Decreased strength, Impaired flexibility  Visit Diagnosis: Muscle weakness (generalized)     Problem List Patient Active Problem List   Diagnosis Date Noted  . Spondylolisthesis of lumbar region 06/22/2017  . Visit for monitoring Tikosyn therapy 03/29/2017  . Hip pain 05/24/2015   Staci Acosta PT, DPT  Staci Acosta 08/17/2017, 11:18 AM  [place] [facility] PHYSICAL AND SPORTS MEDICINE [address], [place], [address] Phone: [phone]   Fax:  [phone]  Name: Luke Morgan MRN: [id number] Date of Birth: December 30, 1950

## 2017-08-19 ENCOUNTER — Encounter: Payer: Self-pay | Admitting: Physical Therapy

## 2017-08-19 ENCOUNTER — Ambulatory Visit: Payer: Medicare Other | Admitting: Physical Therapy

## 2017-08-19 DIAGNOSIS — M6281 Muscle weakness (generalized): Secondary | ICD-10-CM | POA: Diagnosis not present

## 2017-08-19 NOTE — Therapy (Signed)
[place] [facility] PH[facility]TS [facility]ICINE [address] [place], [address] Phone: [phone]   Fax:  [phone]  Physical Therapy Treatment  Patient Details  Name: Luke Morgan MRN: [id number] Date of Birth: July 01, 1950 Referring Provider: Lovell Sheehan   Encounter Date: 08/19/2017  PT End of Session - 08/19/17 0926    Visit Number  9    Number of Visits  17    Date for PT Re-Evaluation  09/13/17    PT Start Time  0915    PT Stop Time  1000    PT Time Calculation (min)  45 min    Activity Tolerance  Patient tolerated treatment well;Patient limited by fatigue    Behavior During Therapy  [facility] for tasks assessed/performed       Past Medical History:  Diagnosis Date  . Asthma   . Atrial fibrillation (HCC)   . History of kidney stones   . Hypertension   . Kidney stones   . Obesity   . Osteoarthritis   . Pre-diabetes    patient is taking metformin  . Sleep apnea   . Spondylolisthesis of lumbar region     Past Surgical History:  Procedure Laterality Date  . CARDIOVERSION N/A 12/24/2016   Procedure: CARDIOVERSION;  Surgeon: Antonieta Iba, MD;  Location: [facility] ORS;  Service: Cardiovascular;  Laterality: N/A;  . CARDIOVERSION N/A 01/04/2017   Procedure: CARDIOVERSION;  Surgeon: Duke Salvia, MD;  Location: [facility] ENDOSCOPY;  Service: Cardiovascular;  Laterality: N/A;  . CARDIOVERSION N/A 03/31/2017   Procedure: CARDIOVERSION;  Surgeon: Laurey Morale, MD;  Location: [facility] ENDOSCOPY;  Service: Cardiovascular;  Laterality: N/A;  . COLONOSCOPY    . LUMBAR FUSION    . TOOTH EXTRACTION      There were no vitals filed for this visit.  Subjective Assessment - 08/19/17 0920    Subjective  Patient reports he has had no pain in the knees. Patient is proud that he was able to walk a block and a half yesterday with min pain and fatigue, and reports this is the first time he has been able to do so in 2 years.     Pertinent History  Patient  is a pleasant 67 y/o male presenting s/p 1/30 posterior lumbar fusion (L4-5) following longstanding spondylolysis and stenosis. Patient ambulates w/ SPC and lumbar brace in the community, reports he does not use them at home. Patient reports that prior to sx he had numbness and tingling down both LE for years, that have now subsided post surgically. Patient reports no pain in the low back today and 0/10 pain throughout the past week. Patient is only taking tylenol at this time. Patient is wearing a lumbar brace that he reports wearing when he goes out in the community. Patient reports he is currently on lifting restrictions >5lbs and excessive ROM for unspecified time (patient is under the impression this is open to PT). Pt reports post surgically his knees have been increasingly bothering him after longstanding chronic pain, and that with prolonged sitting he feels as though he has to have his cane in order to stand up and walk safely. Patient reports that he feels unsteady walking, but that has become better since his sensation has increased post surgicall, but he feels as though his LEs will "give out".     Limitations  Standing    How long can you sit comfortably?  No limitations    How long can you stand comfortably?  10 minutes    Diagnostic tests  See MRI report, L4-L5 fusion    Patient Stated Goals  Be able to stand and walk for longer durations of time- would like to be able to do so w/o AD; would like to be able to perform in play this Summer         Ther-Ex -Nustep L 5 for warmup during hx intake (unbilled) -OMEGA leg press 35# 2x 12 40# 1x 12 with much better eccentric control than previous visit -OMEGA knee ext 35# 3x 12 (after patient unable to tolerate 40-45#) with cuing for eccentric control  -OMEGA knee flex 65# 3x 12 with cuing for eccentric control -Matrix hip abd 55# 3x 12e with cuing for hip positioning and eccentric control -Step Ups 2x 12e alt leading L and R with patient  needing more UE support leading with L with cuing for eccentric control and full ext  *seated rest between standing exercise after L and R LE set*   BP taken 2 mins following therex on R arm  No data recorded               PT Education - 08/19/17 0925    Education provided  Yes    Education Details  Exercise form    Person(s) Educated  Patient    Methods  Explanation;Demonstration;Tactile cues;Verbal cues    Comprehension  Verbal cues required;Returned demonstration;Verbalized understanding;Tactile cues required       PT Short Term Goals - 07/19/17 1524      PT SHORT TERM GOAL #1   Title  Pt will be independent with HEP in order to improve strength and balance in order to decrease fall risk and improve function at home and work.    Time  2    Period  Weeks    Status  New        PT Long Term Goals - 07/19/17 1525      PT LONG TERM GOAL #1   Title  Pt will be able to comfortably stand unsupported without N/T for 20 minutes to complete ADLs    Baseline  2/26: Pt able to stand for 5-10 min before experiencing N/T or unsteadiness    Time  8    Period  Weeks    Status  New      PT LONG TERM GOAL #2   Title  Pt will decrease 5TSTS by at least 3 seconds in order to demonstrate clinically significant improvement in LE strength    Baseline  2/26: 27sec w/ UE support on bilat thighs    Time  8    Period  Weeks    Status  New      PT LONG TERM GOAL #3   Title  Pt will increase by at least 51m (140ft) in order to demonstrate clinically significant improvement in cardiopulmonary endurance and community ambulation    Baseline  2/26: 9104ft w/o AD    Time  9    Period  Weeks    Status  New      PT LONG TERM GOAL #4   Title  Patient will increase gross hip strength to 4/5 bilat in order to complete safe squatting and stooping    Baseline  2/26: hip flexion: 4/5 bilat; hip ext: 3+5 bilat; Hip IR 3+/5 bilat; Hip ER 3+/5; Hip abd L4/5 R4-/5    Time  8    Period   Weeks    Status  New  Plan - 08/19/17 0938    Clinical Impression Statement  Pt tolerated therex progression well with increased reps for muscular endurance range as tolerated by patient. Patient reports no pain through therex, only fatigue. Patient is subjectively reporting that he is able to walk better and longer, and feels stronger/steadier on his feet. PT is pleased with Patient's subjective report and ability to continue therex, through mild fatigue with PT encouragement.     Rehab Potential  Fair    Clinical Impairments Affecting Rehab Potential  Negative- medical comorbidities Positives- motivated     PT Frequency  2x / week    PT Duration  8 weeks    PT Treatment/Interventions  Aquatic Therapy;Gait training;Stair training;Therapeutic activities;Therapeutic exercise;Balance training;Manual techniques;Energy conservation;Functional mobility training;Moist Heat;Cryotherapy;ADLs/Self Care Home Management;Neuromuscular re-education;Passive range of motion;Taping;Patient/family education    PT Next Visit Plan  continue strengthening with hip ext focus    PT Home Exercise Plan  Walking at a time, posterior pelvic tilts, STS    Consulted and Agree with Plan of Care  Patient       Patient will benefit from skilled therapeutic intervention in order to improve the following deficits and impairments:  Abnormal gait, Decreased balance, Decreased activity tolerance, Decreased endurance, Difficulty walking, Impaired sensation, Improper body mechanics, Decreased mobility, Decreased strength, Impaired flexibility  Visit Diagnosis: Muscle weakness (generalized)     Problem List Patient Active Problem List   Diagnosis Date Noted  . Spondylolisthesis of lumbar region 06/22/2017  . Visit for monitoring Tikosyn therapy 03/29/2017  . Hip pain 05/24/2015   Staci Acosta PT, DPT Staci Acosta 08/19/2017, 9:51 AM  [facility] PHYSICAL AND  SPORTS MEDICINE [address], [place], [address] Phone: [phone]   Fax:  [phone]  Name: Luke Morgan MRN: [id number] Date of Birth: [birth year]

## 2017-08-22 ENCOUNTER — Ambulatory Visit: Payer: Medicare Other | Admitting: Physical Therapy

## 2017-08-22 ENCOUNTER — Telehealth: Payer: Self-pay | Admitting: Internal Medicine

## 2017-08-22 NOTE — Telephone Encounter (Signed)
I reviewed the patient's chart. He was last seen by Dr. Graciela Husbands on 07/07/17 and his medication list shows that he was on losartan 100 mg once daily. No medication changes were made at that office visit and no documentation of any changes from our office since that time.  I left a message on the pharmacy voice mail that the patient should be on losartan 100 mg once daily and to call back with any further questions.

## 2017-08-22 NOTE — Telephone Encounter (Signed)
New Message   Jody at [name] is calling to verify dosage  Pt c/o medication issue:  1. Name of Medication: losartan (COZAAR) 100 MG tablet  2. How are you currently taking this medication (dosage and times per day)? Take 1 tablet (100 mg total) by mouth daily  3. Are you having a reaction (difficulty breathing--STAT)? no  4. What is your medication issue? Jody at [facility] states that the pt hasn't filled his medication since 1/28 and wants to know if he is suppose to be taking 2x daily or just once a day. Please call

## 2017-08-23 ENCOUNTER — Other Ambulatory Visit: Payer: Self-pay

## 2017-08-23 ENCOUNTER — Ambulatory Visit: Payer: Medicare Other | Attending: Internal Medicine

## 2017-08-23 DIAGNOSIS — M5416 Radiculopathy, lumbar region: Secondary | ICD-10-CM | POA: Diagnosis present

## 2017-08-23 DIAGNOSIS — R262 Difficulty in walking, not elsewhere classified: Secondary | ICD-10-CM | POA: Insufficient documentation

## 2017-08-23 DIAGNOSIS — M6281 Muscle weakness (generalized): Secondary | ICD-10-CM | POA: Diagnosis present

## 2017-08-23 NOTE — Therapy (Signed)
[facility] MAIN [facility] SERVICES [address] [place], [place], [address] Phone: [phone]   Fax:  [phone]  Physical Therapy Treatment  Patient Details  Name: CASTEN GREGORICH MRN: [id number] Date of Birth: 10/27/1950 Referring Provider: Lovell Sheehan   Encounter Date: 08/23/2017  PT End of Session - 08/23/17 1856    Visit Number  10    Number of Visits  17    Date for PT Re-Evaluation  09/13/17    PT Start Time  0815    PT Stop Time  0915    PT Time Calculation (min)  60 min    Activity Tolerance  Patient tolerated treatment well;Patient limited by fatigue    Behavior During Therapy  [facility] for tasks assessed/performed       Past Medical History:  Diagnosis Date  . Asthma   . Atrial fibrillation (HCC)   . History of kidney stones   . Hypertension   . Kidney stones   . Obesity   . Osteoarthritis   . Pre-diabetes    patient is taking metformin  . Sleep apnea   . Spondylolisthesis of lumbar region     Past Surgical History:  Procedure Laterality Date  . CARDIOVERSION N/A 12/24/2016   Procedure: CARDIOVERSION;  Surgeon: Antonieta Iba, MD;  Location: [facility] ORS;  Service: Cardiovascular;  Laterality: N/A;  . CARDIOVERSION N/A 01/04/2017   Procedure: CARDIOVERSION;  Surgeon: Duke Salvia, MD;  Location: [facility] ENDOSCOPY;  Service: Cardiovascular;  Laterality: N/A;  . CARDIOVERSION N/A 03/31/2017   Procedure: CARDIOVERSION;  Surgeon: Laurey Morale, MD;  Location: [facility] ENDOSCOPY;  Service: Cardiovascular;  Laterality: N/A;  . COLONOSCOPY    . LUMBAR FUSION    . TOOTH EXTRACTION      There were no vitals filed for this visit.  Subjective Assessment - 08/23/17 1851    Subjective  Pt denies pain currently, but notes intermittent pain with in B knees. Pt wishes to improve strength, balance and overall endurance for quality of life.     Pertinent History  Patient is a pleasant 67 y/o male presenting s/p 1/30 posterior lumbar fusion  (L4-5) following longstanding spondylolysis and stenosis. Patient ambulates w/ SPC and lumbar brace in the community, reports he does not use them at home. Patient reports that prior to sx he had numbness and tingling down both LE for years, that have now subsided post surgically. Patient reports no pain in the low back today and 0/10 pain throughout the past week. Patient is only taking tylenol at this time. Patient is wearing a lumbar brace that he reports wearing when he goes out in the community. Patient reports he is currently on lifting restrictions >5lbs and excessive ROM for unspecified time (patient is under the impression this is open to PT). Pt reports post surgically his knees have been increasingly bothering him after longstanding chronic pain, and that with prolonged sitting he feels as though he has to have his cane in order to stand up and walk safely. Patient reports that he feels unsteady walking, but that has become better since his sensation has increased post surgicall, but he feels as though his LEs will "give out".       Enters/exits via ramp Participates in the following  Ambulation, blue dumbbells for support  Fwd 4 L  Side 2 L  Side with minisquat 2 L  Core stabilization with UE work  Red dumbbells, 2 x 10 ea   Triceps press down  Sh abd/add   Sh flex/ext   Sh horiz abd/add   Fall to recover; safety and core work  Fwd, 10x  Side, 5x each side  Core stabilization with LE work, 20x ea  hip abd/add  hip flex/ext  Squats  Modified Plank at rail, 3 x 20 sec ea  Fwd  Side, B                         PT Education - 08/23/17 1854    Education provided  Yes    Education Details  Properties of water and benefits of aquatic therapy. Core stabilization as it pertains to exercise and ambulation. Core strengthening exercises with UE and LE work and modified plank series.        PT Short Term Goals - 07/19/17 1524      PT SHORT TERM GOAL #1    Title  Pt will be independent with HEP in order to improve strength and balance in order to decrease fall risk and improve function at home and work.    Time  2    Period  Weeks    Status  New        PT Long Term Goals - 07/19/17 1525      PT LONG TERM GOAL #1   Title  Pt will be able to comfortably stand unsupported without N/T for 20 minutes to complete ADLs    Baseline  2/26: Pt able to stand for 5-10 min before experiencing N/T or unsteadiness    Time  8    Period  Weeks    Status  New      PT LONG TERM GOAL #2   Title  Pt will decrease 5TSTS by at least 3 seconds in order to demonstrate clinically significant improvement in LE strength    Baseline  2/26: 27sec w/ UE support on bilat thighs    Time  8    Period  Weeks    Status  New      PT LONG TERM GOAL #3   Title  Pt will increase by at least 23m (129ft) in order to demonstrate clinically significant improvement in cardiopulmonary endurance and community ambulation    Baseline  2/26: 968ft w/o AD    Time  9    Period  Weeks    Status  New      PT LONG TERM GOAL #4   Title  Patient will increase gross hip strength to 4/5 bilat in order to complete safe squatting and stooping    Baseline  2/26: hip flexion: 4/5 bilat; hip ext: 3+5 bilat; Hip IR 3+/5 bilat; Hip ER 3+/5; Hip abd L4/5 R4-/5    Time  8    Period  Weeks    Status  New            Plan - 08/23/17 1856    Rehab Potential  Fair    Clinical Impairments Affecting Rehab Potential  Negative- medical comorbidities Positives- motivated     PT Frequency  2x / week    PT Duration  8 weeks    PT Treatment/Interventions  Aquatic Therapy;Gait training;Stair training;Therapeutic activities;Therapeutic exercise;Balance training;Manual techniques;Energy conservation;Functional mobility training;Moist Heat;Cryotherapy;ADLs/Self Care Home Management;Neuromuscular re-education;Passive range of motion;Taping;Patient/family education    PT Next Visit Plan  continue  strengthening with hip ext focus    PT Home Exercise Plan  Walking at a time, posterior pelvic tilts, STS    Consulted  and Agree with Plan of Care  Patient       Patient will benefit from skilled therapeutic intervention in order to improve the following deficits and impairments:  Abnormal gait, Decreased balance, Decreased activity tolerance, Decreased endurance, Difficulty walking, Impaired sensation, Improper body mechanics, Decreased mobility, Decreased strength, Impaired flexibility  Visit Diagnosis: Muscle weakness (generalized)  Difficulty in walking, not elsewhere classified  Radiculopathy, lumbar region     Problem List Patient Active Problem List   Diagnosis Date Noted  . Spondylolisthesis of lumbar region 06/22/2017  . Visit for monitoring Tikosyn therapy 03/29/2017  . Hip pain 05/24/2015    Scot Dock 08/23/2017, 6:57 PM  [facility] MAIN [facility] SERVICES [address] [place], [place], [address] Phone: [phone]   Fax:  [phone]  Name: PAYTON PRINSEN MRN: [id number] Date of Birth: 04/05/1951

## 2017-08-24 ENCOUNTER — Encounter: Payer: Self-pay | Admitting: Physical Therapy

## 2017-08-24 ENCOUNTER — Ambulatory Visit: Payer: Medicare Other | Admitting: Physical Therapy

## 2017-08-24 DIAGNOSIS — M6281 Muscle weakness (generalized): Secondary | ICD-10-CM | POA: Diagnosis not present

## 2017-08-24 NOTE — Therapy (Signed)
[facility] AND [facility]ORTS MEDICINE [address] [place], [address] Phone: [phone]   Fax:  [phone]  Physical Therapy Treatment  Patient Details  Name: Luke Morgan MRN: [id number] Date of Birth: 11-27-1950 Referring Provider: Lovell Sheehan   Encounter Date: 08/24/2017  PT End of Session - 08/24/17 1155    Visit Number  11    Number of Visits  17    Date for PT Re-Evaluation  09/13/17    PT Start Time  1115    PT Stop Time  1200    PT Time Calculation (min)  45 min    Activity Tolerance  Patient tolerated treatment well;Patient limited by fatigue    Behavior During Therapy  [facility] for tasks assessed/performed       Past Medical History:  Diagnosis Date  . Asthma   . Atrial fibrillation (HCC)   . History of kidney stones   . Hypertension   . Kidney stones   . Obesity   . Osteoarthritis   . Pre-diabetes    patient is taking metformin  . Sleep apnea   . Spondylolisthesis of lumbar region     Past Surgical History:  Procedure Laterality Date  . CARDIOVERSION N/A 12/24/2016   Procedure: CARDIOVERSION;  Surgeon: Antonieta Iba, MD;  Location: [facility] ORS;  Service: Cardiovascular;  Laterality: N/A;  . CARDIOVERSION N/A 01/04/2017   Procedure: CARDIOVERSION;  Surgeon: Duke Salvia, MD;  Location: [facility] ENDOSCOPY;  Service: Cardiovascular;  Laterality: N/A;  . CARDIOVERSION N/A 03/31/2017   Procedure: CARDIOVERSION;  Surgeon: Laurey Morale, MD;  Location: [facility] ENDOSCOPY;  Service: Cardiovascular;  Laterality: N/A;  . COLONOSCOPY    . LUMBAR FUSION    . TOOTH EXTRACTION      There were no vitals filed for this visit.  Subjective Assessment - 08/24/17 1119    Subjective  Patient reports that he enjoyed pool therapy yesterday, but does not think it "worked his muscles" as much as land exercises. Patient reports no pain in the knees, just slight "strain from increased activity". Patient continues to deny compliance with  HEP, but continues to report increased walking.     Pertinent History  Patient is a pleasant 67 y/o male presenting s/p 1/30 posterior lumbar fusion (L4-5) following longstanding spondylolysis and stenosis. Patient ambulates w/ SPC and lumbar brace in the community, reports he does not use them at home. Patient reports that prior to sx he had numbness and tingling down both LE for years, that have now subsided post surgically. Patient reports no pain in the low back today and 0/10 pain throughout the past week. Patient is only taking tylenol at this time. Patient is wearing a lumbar brace that he reports wearing when he goes out in the community. Patient reports he is currently on lifting restrictions >5lbs and excessive ROM for unspecified time (patient is under the impression this is open to PT). Pt reports post surgically his knees have been increasingly bothering him after longstanding chronic pain, and that with prolonged sitting he feels as though he has to have his cane in order to stand up and walk safely. Patient reports that he feels unsteady walking, but that has become better since his sensation has increased post surgicall, but he feels as though his LEs will "give out".     Limitations  Standing    How long can you sit comfortably?  No limitations    How long can you stand comfortably?  10 minutes    Diagnostic tests  See MRI report, L4-L5 fusion    Patient Stated Goals  Be able to stand and walk for longer durations of time- would like to be able to do so w/o AD; would like to be able to perform in play this Summer         Ther-Ex -Nustep L5 for warmup during hx intake 4 min (unbilled) -Total gym leg press L21 3x 10  -OMEGA knee ext 38# 3x 12 with cuing for full availible knee ext  -OMEGA knee flex 68# 3x 12 with cuing for eccentric control -Matrix hip abd 58# 3x 10e with cuing for hip positioning and eccentric control -Matrix hip ext 70# 1x 12e 73# 2x 12e with cuing for posture and  eccentric control *Prolonged seated rest break needed between hip exercise* -Standing heel raises 3x 12 from step with cuing for eccentric control   *seated rest between standing exercise after L and R LE set* BP taken 2 mins following therex on R arm                        PT Education - 08/24/17 1155    Education provided  Yes    Education Details  Exercise form    Person(s) Educated  Patient    Methods  Explanation;Demonstration;Verbal cues    Comprehension  Verbal cues required;Returned demonstration;Verbalized understanding       PT Short Term Goals - 07/19/17 1524      PT SHORT TERM GOAL #1   Title  Pt will be independent with HEP in order to improve strength and balance in order to decrease fall risk and improve function at home and work.    Time  2    Period  Weeks    Status  New        PT Long Term Goals - 07/19/17 1525      PT LONG TERM GOAL #1   Title  Pt will be able to comfortably stand unsupported without N/T for 20 minutes to complete ADLs    Baseline  2/26: Pt able to stand for 5-10 min before experiencing N/T or unsteadiness    Time  8    Period  Weeks    Status  New      PT LONG TERM GOAL #2   Title  Pt will decrease 5TSTS by at least 3 seconds in order to demonstrate clinically significant improvement in LE strength    Baseline  2/26: 27sec w/ UE support on bilat thighs    Time  8    Period  Weeks    Status  New      PT LONG TERM GOAL #3   Title  Pt will increase by at least 2m (187ft) in order to demonstrate clinically significant improvement in cardiopulmonary endurance and community ambulation    Baseline  2/26: 99ft w/o AD    Time  9    Period  Weeks    Status  New      PT LONG TERM GOAL #4   Title  Patient will increase gross hip strength to 4/5 bilat in order to complete safe squatting and stooping    Baseline  2/26: hip flexion: 4/5 bilat; hip ext: 3+5 bilat; Hip IR 3+/5 bilat; Hip ER 3+/5; Hip abd L4/5 R4-/5     Time  8    Period  Weeks    Status  New  Plan - 08/24/17 1158    Clinical Impression Statement  Pt is no longer having R vs L deficits and is able to perform SLS on each LE without increased pain standing on L, which is an improvement. Patient is able to tolerate therex progression well with no increased pain, only severe fatigue requiring rest break following standing exercises. PT continues to encourage patient that "pain" that is muscle fatigue is "okay" and is different from harmful pain. Patient vitals remained stable following exercise as well.     Clinical Impairments Affecting Rehab Potential  Negative- medical comorbidities Positives- motivated     PT Frequency  2x / week    PT Duration  8 weeks    PT Treatment/Interventions  Aquatic Therapy;Gait training;Stair training;Therapeutic activities;Therapeutic exercise;Balance training;Manual techniques;Energy conservation;Functional mobility training;Moist Heat;Cryotherapy;ADLs/Self Care Home Management;Neuromuscular re-education;Passive range of motion;Taping;Patient/family education    PT Next Visit Plan  continue strengthening with hip ext focus    PT Home Exercise Plan  Walking at a time, posterior pelvic tilts, STS    Consulted and Agree with Plan of Care  Patient       Patient will benefit from skilled therapeutic intervention in order to improve the following deficits and impairments:  Abnormal gait, Decreased balance, Decreased activity tolerance, Decreased endurance, Difficulty walking, Impaired sensation, Improper body mechanics, Decreased mobility, Decreased strength, Impaired flexibility  Visit Diagnosis: Muscle weakness (generalized)     Problem List Patient Active Problem List   Diagnosis Date Noted  . Spondylolisthesis of lumbar region 06/22/2017  . Visit for monitoring Tikosyn therapy 03/29/2017  . Hip pain 05/24/2015   Staci Acosta PT, DPT Staci Acosta 08/24/2017, 12:24 PM  [facility] REGIONAL [facility] PHYSICAL AND SPORTS MEDICINE [address], [place], [address] Phone: [phone]   Fax:  [phone]  Name: Luke Morgan MRN: [id number] Date of Birth: Oct 05, 1950

## 2017-08-29 ENCOUNTER — Encounter: Payer: Self-pay | Admitting: Physical Therapy

## 2017-08-29 ENCOUNTER — Ambulatory Visit: Payer: Medicare Other | Admitting: Physical Therapy

## 2017-08-29 DIAGNOSIS — M6281 Muscle weakness (generalized): Secondary | ICD-10-CM | POA: Diagnosis not present

## 2017-08-29 NOTE — Therapy (Signed)
[place] [facility] [address] [place], [address] Phone: [phone]   Fax:  [phone]  Physical Therapy Treatment  Patient Details  Name: Luke Morgan MRN: [id number] Date of Birth: [birth year] Referring Provider: Lovell Sheehan   Encounter Date: 08/29/2017  PT End of Session - 08/29/17 1347    Visit Number  12    Number of Visits  17    Date for PT Re-Evaluation  09/13/17    PT Start Time  0130    PT Stop Time  0215    PT Time Calculation (min)  45 min    Activity Tolerance  Patient tolerated treatment well;Patient limited by fatigue    Behavior During Therapy  [facility] for tasks assessed/performed       Past Medical History:  Diagnosis Date  . Asthma   . Atrial fibrillation (HCC)   . History of kidney stones   . Hypertension   . Kidney stones   . Obesity   . Osteoarthritis   . Pre-diabetes    patient is taking metformin  . Sleep apnea   . Spondylolisthesis of lumbar region     Past Surgical History:  Procedure Laterality Date  . CARDIOVERSION N/A 12/24/2016   Procedure: CARDIOVERSION;  Surgeon: Antonieta Iba, MD;  Location: [facility] ORS;  Service: Cardiovascular;  Laterality: N/A;  . CARDIOVERSION N/A 01/04/2017   Procedure: CARDIOVERSION;  Surgeon: Duke Salvia, MD;  Location: [facility] ENDOSCOPY;  Service: Cardiovascular;  Laterality: N/A;  . CARDIOVERSION N/A 03/31/2017   Procedure: CARDIOVERSION;  Surgeon: Laurey Morale, MD;  Location: [facility] ENDOSCOPY;  Service: Cardiovascular;  Laterality: N/A;  . COLONOSCOPY    . LUMBAR FUSION    . TOOTH EXTRACTION      There were no vitals filed for this visit.  Subjective Assessment - 08/29/17 1335    Subjective  Patient reports that he is having some pain in his back in the mornings, but that it subsides after he is "up and moving for a little while" and that a warm shower helps as well. Patient continues to report that he is not compliant with his home  exercises. PT encouraged patient to do his exercises and asked him if he would like another print out of different exercises to do to make him more motivated/compliant and patient reports he would like this.     Pertinent History  Patient is a pleasant 67 y/o male presenting s/p 1/30 posterior lumbar fusion (L4-5) following longstanding spondylolysis and stenosis. Patient ambulates w/ SPC and lumbar brace in the community, reports he does not use them at home. Patient reports that prior to sx he had numbness and tingling down both LE for years, that have now subsided post surgically. Patient reports no pain in the low back today and 0/10 pain throughout the past week. Patient is only taking tylenol at this time. Patient is wearing a lumbar brace that he reports wearing when he goes out in the community. Patient reports he is currently on lifting restrictions >5lbs and excessive ROM for unspecified time (patient is under the impression this is open to PT). Pt reports post surgically his knees have been increasingly bothering him after longstanding chronic pain, and that with prolonged sitting he feels as though he has to have his cane in order to stand up and walk safely. Patient reports that he feels unsteady walking, but that has become better since his sensation has increased post surgicall, but he  feels as though his LEs will "give out".     Limitations  Standing    How long can you sit comfortably?  No limitations    How long can you stand comfortably?  10 minutes    Diagnostic tests  See MRI report, L4-L5 fusion    Patient Stated Goals  Be able to stand and walk for longer durations of time- would like to be able to do so w/o AD; would like to be able to perform in play this Summer       Ther-Ex -NuStep 4 min during hx intake (unbilled -Mini squat exercise at treadmill bar 3x 6/6 patient took seated rest breaks at 6 reps reporting fatigue -Mini lunge exercise at treadmill bar 1x 7 each side patient  reports extreme fatigue and has an audible "popping" in L knee which patient reports is not painful but is "concerning" to him so discontinued -Heel raises 3x 12 with cuing for eccentric control -Standing hip abd 3x 12 with cuing for eccentric control and to prevent lateral trunk lean  -Standing hip ext 3x 12 with cuing for eccentric control and glute activation   BP R UE in sitting 138/80 2 mins following                 PT Education - 08/29/17 1339    Education provided  Yes    Education Details  Exercise form; HEP importance to maintain strength gain made in PT    Person(s) Educated  Patient    Methods  Explanation;Demonstration;Tactile cues;Verbal cues;Handout    Comprehension  Verbal cues required;Tactile cues required;Returned demonstration;Verbalized understanding;Need further instruction       PT Short Term Goals - 07/19/17 1524      PT SHORT TERM GOAL #1   Title  Pt will be independent with HEP in order to improve strength and balance in order to decrease fall risk and improve function at home and work.    Time  2    Period  Weeks    Status  New        PT Long Term Goals - 07/19/17 1525      PT LONG TERM GOAL #1   Title  Pt will be able to comfortably stand unsupported without N/T for 20 minutes to complete ADLs    Baseline  2/26: Pt able to stand for 5-10 min before experiencing N/T or unsteadiness    Time  8    Period  Weeks    Status  New      PT LONG TERM GOAL #2   Title  Pt will decrease 5TSTS by at least 3 seconds in order to demonstrate clinically significant improvement in LE strength    Baseline  2/26: 27sec w/ UE support on bilat thighs    Time  8    Period  Weeks    Status  New      PT LONG TERM GOAL #3   Title  Pt will increase by at least 47m (143ft) in order to demonstrate clinically significant improvement in cardiopulmonary endurance and community ambulation    Baseline  2/26: 929ft w/o AD    Time  9    Period  Weeks     Status  New      PT LONG TERM GOAL #4   Title  Patient will increase gross hip strength to 4/5 bilat in order to complete safe squatting and stooping    Baseline  2/26: hip flexion: 4/5  bilat; hip ext: 3+5 bilat; Hip IR 3+/5 bilat; Hip ER 3+/5; Hip abd L4/5 R4-/5    Time  8    Period  Weeks    Status  New            Plan - 08/29/17 1409    Clinical Impression Statement  During lunge exercise patient experienced painless "popping" in the L knee and reported that the sound made him uneasy Pt reported that he had 90% cartilage loss in the R knee and 100% in the L and felt like he has to get his knees replaced. PT looked at patient's Xray and used knee model to explain where the medial cartilage loss is and it's effect on knee mechanics. Patient reported if he had to have knee surgery he does not want to continue to "waste time at PT". PT encouraged patient that the more strength he is able to gain before surgery will lead to increased positive results with surgical intervention. Pt asks PT what PT thinks of TKA or double TKA, PT advised patient that this would best be answered at the discretion on the surgeon, and encouraged patient to be sure his health care team is aware of his concerns about his endurance, and the unsteadiness he feels in gait/stair ambulation d/t LE weakness. Pt reported that if he needs surgery he feels as though the surgeon is "dragging his feet", as Xrays were in Dec, PT advised patient that usually the health care team wants to attempt conservative management as much as possible, as surgical intervention is not something to take lightly. Patient verbalized understanding. It was evident that patient was discouraged by his progress with strengthening this far and PT encouraged him that he has made gains and is able to do more higher level exercise in the gym and add resistance as well, and that strength gains take time and consistency. With this being said, PT offered patient new  HEP to promote adherence and consistency. Patient reports he is going to attempt to be more diligent with this.     Clinical Impairments Affecting Rehab Potential  Negative- medical comorbidities Positives- motivated     PT Frequency  2x / week    PT Treatment/Interventions  Aquatic Therapy;Gait training;Stair training;Therapeutic activities;Therapeutic exercise;Balance training;Manual techniques;Energy conservation;Functional mobility training;Moist Heat;Cryotherapy;ADLs/Self Care Home Management;Neuromuscular re-education;Passive range of motion;Taping;Patient/family education    PT Next Visit Plan  continue strengthening with hip ext focus    PT Home Exercise Plan  Mini squat, standing hip abd and flex, and standing heel raises    Consulted and Agree with Plan of Care  Patient       Patient will benefit from skilled therapeutic intervention in order to improve the following deficits and impairments:  Abnormal gait, Decreased balance, Decreased activity tolerance, Decreased endurance, Difficulty walking, Impaired sensation, Improper body mechanics, Decreased mobility, Decreased strength, Impaired flexibility  Visit Diagnosis: Muscle weakness (generalized)     Problem List Patient Active Problem List   Diagnosis Date Noted  . Spondylolisthesis of lumbar region 06/22/2017  . Visit for monitoring Tikosyn therapy 03/29/2017  . Hip pain 05/24/2015   Staci Acosta PT, DPT Staci Acosta 08/29/2017, 3:15 PM  [place] [facility] PHYSICAL AND SPORTS MEDICINE [address], [place], [address] Phone: [phone]   Fax:  [phone]  Name: Luke Morgan MRN: [id number] Date of Birth: 1951-04-06

## 2017-08-31 ENCOUNTER — Ambulatory Visit: Payer: Medicare Other | Admitting: Physical Therapy

## 2017-08-31 ENCOUNTER — Encounter: Payer: Self-pay | Admitting: Physical Therapy

## 2017-08-31 DIAGNOSIS — M6281 Muscle weakness (generalized): Secondary | ICD-10-CM | POA: Diagnosis not present

## 2017-08-31 NOTE — Therapy (Signed)
[place] [facility] [address] [place], [address] Phone: [phone]   Fax:  [phone]  Physical Therapy Treatment  Patient Details  Name: Luke Morgan MRN: [id number] Date of Birth: 13-Jan-1951 Referring Provider: Lovell Sheehan   Encounter Date: 08/31/2017  PT End of Session - 08/31/17 1128    Visit Number  13    Number of Visits  17    Date for PT Re-Evaluation  09/13/17    PT Start Time  1120    PT Stop Time  1200    PT Time Calculation (min)  40 min    Activity Tolerance  Patient tolerated treatment well;Patient limited by fatigue    Behavior During Therapy  [facility] for tasks assessed/performed       Past Medical History:  Diagnosis Date  . Asthma   . Atrial fibrillation (HCC)   . History of kidney stones   . Hypertension   . Kidney stones   . Obesity   . Osteoarthritis   . Pre-diabetes    patient is taking metformin  . Sleep apnea   . Spondylolisthesis of lumbar region     Past Surgical History:  Procedure Laterality Date  . CARDIOVERSION N/A 12/24/2016   Procedure: CARDIOVERSION;  Surgeon: Antonieta Iba, MD;  Location: [facility] ORS;  Service: Cardiovascular;  Laterality: N/A;  . CARDIOVERSION N/A 01/04/2017   Procedure: CARDIOVERSION;  Surgeon: Duke Salvia, MD;  Location: [facility] ENDOSCOPY;  Service: Cardiovascular;  Laterality: N/A;  . CARDIOVERSION N/A 03/31/2017   Procedure: CARDIOVERSION;  Surgeon: Laurey Morale, MD;  Location: [facility] ENDOSCOPY;  Service: Cardiovascular;  Laterality: N/A;  . COLONOSCOPY    . LUMBAR FUSION    . TOOTH EXTRACTION      There were no vitals filed for this visit.  Subjective Assessment - 08/31/17 1122    Subjective  Patient reports he is still having some very mild pain in the knees in the am, but he reports that it has improved with the weather improving. Patient reports no compliance with HEP. No questions or concerns at this time.     Pertinent History   Patient is a pleasant 67 y/o male presenting s/p 1/30 posterior lumbar fusion (L4-5) following longstanding spondylolysis and stenosis. Patient ambulates w/ SPC and lumbar brace in the community, reports he does not use them at home. Patient reports that prior to sx he had numbness and tingling down both LE for years, that have now subsided post surgically. Patient reports no pain in the low back today and 0/10 pain throughout the past week. Patient is only taking tylenol at this time. Patient is wearing a lumbar brace that he reports wearing when he goes out in the community. Patient reports he is currently on lifting restrictions >5lbs and excessive ROM for unspecified time (patient is under the impression this is open to PT). Pt reports post surgically his knees have been increasingly bothering him after longstanding chronic pain, and that with prolonged sitting he feels as though he has to have his cane in order to stand up and walk safely. Patient reports that he feels unsteady walking, but that has become better since his sensation has increased post surgicall, but he feels as though his LEs will "give out".     Limitations  Standing    How long can you sit comfortably?  No limitations    How long can you stand comfortably?  10 minutes    Diagnostic  tests  See MRI report, L4-L5 fusion    Patient Stated Goals  Be able to stand and walk for longer durations of time- would like to be able to do so w/o AD; would like to be able to perform in play this Summer       Ther-Ex -NuStep 4 mins during hx intake L4 -Mini squat exercise on airex pad 3x 10 w/ no audible popping today -Monster walks with blue band at ankles 67ft x3 (63ft down/65ft back) with cuing for eccentric control, patient demonstrates high guard to maintain balance with moving into narrow BOS -total gym leg press 1x 12 L 21 2x 10 L 22 -NOB stance at treadmill bar bilat 2 finger support 30sec hold; with hover hand support 30sec hold w/  increased lateral sway; semi tandem w/ hover UE support 30sec hold w/ min lateral sway; full tandem with R foot in front in front able to hold 30sec with high guard, with L LE in front pt is able to hold 9 sec and subsequent 19sec with increased lateral sway and high gaurd -Step Up exercise 2x 10e (patient is unable to complete SL lowering safely) -Lateral step downs 1x 10 with R leg 50% motion to floor from 6" step and unable to complete exercise on L side today  BP taken on R UE 2 min following:                         PT Education - 08/31/17 1127    Education provided  Yes    Education Details  Exercise form; HEP reinforcement    Person(s) Educated  Patient    Methods  Explanation;Demonstration;Verbal cues    Comprehension  Verbal cues required;Returned demonstration;Verbalized understanding       PT Short Term Goals - 07/19/17 1524      PT SHORT TERM GOAL #1   Title  Pt will be independent with HEP in order to improve strength and balance in order to decrease fall risk and improve function at home and work.    Time  2    Period  Weeks    Status  New        PT Long Term Goals - 07/19/17 1525      PT LONG TERM GOAL #1   Title  Pt will be able to comfortably stand unsupported without N/T for 20 minutes to complete ADLs    Baseline  2/26: Pt able to stand for 5-10 min before experiencing N/T or unsteadiness    Time  8    Period  Weeks    Status  New      PT LONG TERM GOAL #2   Title  Pt will decrease 5TSTS by at least 3 seconds in order to demonstrate clinically significant improvement in LE strength    Baseline  2/26: 27sec w/ UE support on bilat thighs    Time  8    Period  Weeks    Status  New      PT LONG TERM GOAL #3   Title  Pt will increase by at least 32m (114ft) in order to demonstrate clinically significant improvement in cardiopulmonary endurance and community ambulation    Baseline  2/26: 944ft w/o AD    Time  9    Period  Weeks     Status  New      PT LONG TERM GOAL #4   Title  Patient will increase gross hip  strength to 4/5 bilat in order to complete safe squatting and stooping    Baseline  2/26: hip flexion: 4/5 bilat; hip ext: 3+5 bilat; Hip IR 3+/5 bilat; Hip ER 3+/5; Hip abd L4/5 R4-/5    Time  8    Period  Weeks    Status  New            Plan - 08/31/17 1335    Clinical Impression Statement  Patient is making progress with therex in PT, but is still unable to tolerate some progression and requires seated rests between most exercises. PT believes that lack of consistency (HEP compliance) is to blame for not carrying over progress, and is slowing progression. PT had another conversation with pt about compliance with HEP and patient responded that he understood that consistency is key to make significant strength gains. Patient is demonstrating good balance with varied BOS and surfaces, leading PT to further believe weakness (esp in hip abd) are the culprit for decreased balance with single leg activities (prolonged ambulation, stairs). Pt expressed that he would like TKA at this point d/t the point of adherence to HEP stating that the stronger the patient is able to be pre-surgery the better his rehab will be following, and that post-surgically surgeon will have exercises patient will need to complete daily for replacement to be successful. Pt verbalized understanding    Clinical Impairments Affecting Rehab Potential  Negative- medical comorbidities Positives- motivated     PT Frequency  2x / week    PT Duration  8 weeks    PT Treatment/Interventions  Aquatic Therapy;Gait training;Stair training;Therapeutic activities;Therapeutic exercise;Balance training;Manual techniques;Energy conservation;Functional mobility training;Moist Heat;Cryotherapy;ADLs/Self Care Home Management;Neuromuscular re-education;Passive range of motion;Taping;Patient/family education    PT Next Visit Plan  continue strengthening with hip ext  focus    PT Home Exercise Plan  Mini squat, standing hip abd and flex, and standing heel raises    Consulted and Agree with Plan of Care  Patient       Patient will benefit from skilled therapeutic intervention in order to improve the following deficits and impairments:  Abnormal gait, Decreased balance, Decreased activity tolerance, Decreased endurance, Difficulty walking, Impaired sensation, Improper body mechanics, Decreased mobility, Decreased strength, Impaired flexibility  Visit Diagnosis: Muscle weakness (generalized)     Problem List Patient Active Problem List   Diagnosis Date Noted  . Spondylolisthesis of lumbar region 06/22/2017  . Visit for monitoring Tikosyn therapy 03/29/2017  . Hip pain 05/24/2015   Staci Acosta PT, DPT Staci Acosta 08/31/2017, 1:40 PM  [place] [facility] PHYSICAL AND SPORTS MEDICINE [address] [place], [address] Phone: [phone]   Fax:  [phone]  Name: Luke Morgan MRN: [id number] Date of Birth: [birth year]

## 2017-09-05 ENCOUNTER — Encounter: Payer: Medicare Other | Admitting: Physical Therapy

## 2017-09-06 ENCOUNTER — Ambulatory Visit: Payer: Medicare Other | Admitting: Physical Therapy

## 2017-09-06 ENCOUNTER — Encounter: Payer: Self-pay | Admitting: Physical Therapy

## 2017-09-06 DIAGNOSIS — R262 Difficulty in walking, not elsewhere classified: Secondary | ICD-10-CM

## 2017-09-06 DIAGNOSIS — M6281 Muscle weakness (generalized): Secondary | ICD-10-CM | POA: Diagnosis not present

## 2017-09-06 DIAGNOSIS — M5416 Radiculopathy, lumbar region: Secondary | ICD-10-CM

## 2017-09-06 NOTE — Therapy (Signed)
[facility] MAIN [facility] SERVICES [address] [place], [place], [address] Phone: [phone]   Fax:  [phone]  Physical Therapy Treatment  Patient Details  Name: Luke Morgan MRN: [id number] Date of Birth: 04-30-1951 Referring Provider: Lovell Sheehan   Encounter Date: 09/06/2017  PT End of Session - 09/06/17 1403    Visit Number  14    Number of Visits  17    Date for PT Re-Evaluation  09/13/17    PT Start Time  0815    PT Stop Time  0900    PT Time Calculation (min)  45 min    Activity Tolerance  Patient tolerated treatment well    Behavior During Therapy  [facility] for tasks assessed/performed       Past Medical History:  Diagnosis Date  . Asthma   . Atrial fibrillation (HCC)   . History of kidney stones   . Hypertension   . Kidney stones   . Obesity   . Osteoarthritis   . Pre-diabetes    patient is taking metformin  . Sleep apnea   . Spondylolisthesis of lumbar region     Past Surgical History:  Procedure Laterality Date  . CARDIOVERSION N/A 12/24/2016   Procedure: CARDIOVERSION;  Surgeon: Antonieta Iba, MD;  Location: [facility] ORS;  Service: Cardiovascular;  Laterality: N/A;  . CARDIOVERSION N/A 01/04/2017   Procedure: CARDIOVERSION;  Surgeon: Duke Salvia, MD;  Location: [facility] ENDOSCOPY;  Service: Cardiovascular;  Laterality: N/A;  . CARDIOVERSION N/A 03/31/2017   Procedure: CARDIOVERSION;  Surgeon: Laurey Morale, MD;  Location: [facility] ENDOSCOPY;  Service: Cardiovascular;  Laterality: N/A;  . COLONOSCOPY    . LUMBAR FUSION    . TOOTH EXTRACTION      There were no vitals filed for this visit.  Subjective Assessment - 09/06/17 1400    Subjective  Reported feeling well.  Continues with non-compliance with HEP.    Pertinent History  Patient is a pleasant 67 y/o male presenting s/p 1/30 posterior lumbar fusion (L4-5) following longstanding spondylolysis and stenosis. Patient ambulates w/ SPC and lumbar brace in the community,  reports he does not use them at home. Patient reports that prior to sx he had numbness and tingling down both LE for years, that have now subsided post surgically. Patient reports no pain in the low back today and 0/10 pain throughout the past week. Patient is only taking tylenol at this time. Patient is wearing a lumbar brace that he reports wearing when he goes out in the community. Patient reports he is currently on lifting restrictions >5lbs and excessive ROM for unspecified time (patient is under the impression this is open to PT). Pt reports post surgically his knees have been increasingly bothering him after longstanding chronic pain, and that with prolonged sitting he feels as though he has to have his cane in order to stand up and walk safely. Patient reports that he feels unsteady walking, but that has become better since his sensation has increased post surgicall, but he feels as though his LEs will "give out".     Currently in Pain?  Yes    Pain Score  3     Pain Location  Knee    Pain Orientation  Right;Left    Pain Descriptors / Indicators  Aching    Pain Type  Chronic pain        Physical Therapy Aquatic Session   Enters/Exits via ramp Participates in the following:  Ambulation 4  L forward Ambulation 4 L sidesteps  Core stabilization with LE work - 30 X Hip abd/add - low to use water as resist Hip flex/ext - low to use water as resist  Core stabilization with UE work - 30X  With blue dumbbells Horizontal ab/add  Figure 8's single side at a time Shld flex/ext  Shoulder press downs  Step ups on step stool in deeper water R 2 x 10 L 2 x 10 - increased difficulty with left and needed deeper water                        PT Education - 09/06/17 1402    Education provided  Yes    Education Details  HEP complinace    Person(s) Educated  Patient    Methods  Explanation;Demonstration;Verbal cues       PT Short Term Goals - 07/19/17 1524      PT  SHORT TERM GOAL #1   Title  Pt will be independent with HEP in order to improve strength and balance in order to decrease fall risk and improve function at home and work.    Time  2    Period  Weeks    Status  New        PT Long Term Goals - 07/19/17 1525      PT LONG TERM GOAL #1   Title  Pt will be able to comfortably stand unsupported without N/T for 20 minutes to complete ADLs    Baseline  2/26: Pt able to stand for 5-10 min before experiencing N/T or unsteadiness    Time  8    Period  Weeks    Status  New      PT LONG TERM GOAL #2   Title  Pt will decrease 5TSTS by at least 3 seconds in order to demonstrate clinically significant improvement in LE strength    Baseline  2/26: 27sec w/ UE support on bilat thighs    Time  8    Period  Weeks    Status  New      PT2Quintel[place]HassIntegris Bass PaM4Quintel[place]HassHealthsouthQuintelKentuckyHassSurgery Center Of Volu7QuintelK[place]uckyHassInland Valley Surgery CentConnecticut Orthopaed3Quintel[place]HassTu2QuintelHassAshland Health Jane Todd Crawford Memorial HospitalC<MEASUR5Quintel[place]HassCentral Valley 2QuintelK[place]uckyHassNorthern Light A R Gould HoThe Hospita(4QuintelK[place]uckyHassHuey P. Long Medi(7QuintelK[place]HassThe Carle Foundation Ho2QuintelK[place]HassViewpoint(5QuintelK[place]uckyHassCullman Regional Medical Un2QuintelK[place]uckyHassCentral N8QuintelKentuckyHassUt Health 5QuintelKentuckyHassHshs St Elizabeth'S HoGulf Sout(7QuintelK[place]uckyHassEndo(7QuintelKentuckyHassSpecialty Surgery Laser Ahmc (56QuintelKentuckyHassNorth Valley Surgery Nor9QuintelKentuckyHassCatarac(8QuintelKentuckyHassSkyline Sur7QuintelKentuckyHassRogers Memorial Hospital BrowLos A(67QuintelKentuckyHassCambridge Medical Rml Healt(3QuintelKentuckyHassMary Hitchcock Memorial HoUniversity Of (3QuintelKentuckyHassBaylor Scott & White Emergency HospitQuintelKentuckyHassAnn & (73QuintelKentuckyHassMethodist Ambulatory Surgery Center Of BoerAdvancedQuintelKentuckyHassTh9QuintelKentuckyHassPatients' Hospital 5QuintelKentuckyHassLogansport State HoMetro Atlanta Endoscopy LLCspitaRubye Oaksny Surgery Center LLCe dMaryclare<MEASUREMELegacy Good Samarita nary endurance and community ambulation    Baseline  2/26: 982ft w/o AD    Time  9    Period  Weeks    Status  New      PT LONG TERM GOAL #4   Title  Patient will increase gross hip strength to 4/5 bilat in order to complete safe squatting and stooping    Baseline  2/26: hip flexion: 4/5 bilat; hip ext: 3+5 bilat; Hip IR 3+/5 bilat; Hip ER 3+/5; Hip abd L4/5 R4-/5    Time  8    Period  Weeks    Status  New            Plan - 09/06/17 1403    Clinical Impression Statement  Patient partitipates well in therapy session and is motivated but home compliance is poor.      Clinical Presentation  Evolving    Clinical Decision Making  Moderate    Rehab Potential  Fair    Clinical Impairments Affecting Rehab Potential  Negative- medical comorbidities     PT Frequency  2x / week    PT Duration  8 weeks    PT Treatment/Interventions  Aquatic Therapy;Gait training;Stair training;Therapeutic activities;Therapeutic exercise;Balance training;Manual  techniques;Energy  conservation;Functional mobility training;Moist Heat;Cryotherapy;ADLs/Self Care Home Management;Neuromuscular re-education;Passive range of motion;Taping;Patient/family education    PT Next Visit Plan  continue strengthening with hip ext focus    PT Home Exercise Plan  Mini squat, standing hip abd and flex, and standing heel raises    Consulted and Agree with Plan of Care  Patient       Patient will benefit from skilled therapeutic intervention in order to improve the following deficits and impairments:  Abnormal gait, Decreased balance, Decreased activity tolerance, Decreased endurance, Difficulty walking, Impaired sensation, Improper body mechanics, Decreased mobility, Decreased strength, Impaired flexibility  Visit Diagnosis: Muscle weakness (generalized)  Difficulty in walking, not elsewhere classified  Radiculopathy, lumbar region     Problem List Patient Active Problem List   Diagnosis Date Noted  . Spondylolisthesis of lumbar region 06/22/2017  . Visit for monitoring Tikosyn therapy 03/29/2017  . Hip pain 05/24/2015    Luke Morgan 09/06/2017, 2:07 PM  [place] MAIN [facility] SERVICES [address] [place], [place], [address] Phone: [phone]   Fax:  [phone]  Name: Luke Morgan MRN: [id number] Date of Birth: Sep 09, 1950

## 2017-09-07 ENCOUNTER — Encounter: Payer: Medicare Other | Admitting: Physical Therapy

## 2017-09-09 ENCOUNTER — Ambulatory Visit: Payer: Medicare Other | Admitting: Physical Therapy

## 2017-09-09 ENCOUNTER — Encounter: Payer: Self-pay | Admitting: Physical Therapy

## 2017-09-09 DIAGNOSIS — M6281 Muscle weakness (generalized): Secondary | ICD-10-CM

## 2017-09-09 NOTE — Therapy (Signed)
[place] [facility] [address] [place], [address] Phone: [phone]   Fax:  [phone]  Physical Therapy Treatment  Patient Details  Name: Luke Morgan MRN: [id number] Date of Birth: 1950-08-19 Referring Provider: Arnoldo Morale   Encounter Date: 09/09/2017  PT End of Session - 09/09/17 0955    Visit Number  15    Number of Visits  25    Date for PT Re-Evaluation  10/11/17    PT Start Time  0915    PT Stop Time  1000    PT Time Calculation (min)  45 min    Activity Tolerance  Patient tolerated treatment well    Behavior During Therapy  [facility] for tasks assessed/performed       Past Medical History:  Diagnosis Date  . Asthma   . Atrial fibrillation ([place])   . History of kidney stones   . Hypertension   . Kidney stones   . Obesity   . Osteoarthritis   . Pre-diabetes    patient is taking metformin  . Sleep apnea   . Spondylolisthesis of lumbar region     Past Surgical History:  Procedure Laterality Date  . CARDIOVERSION N/A 12/24/2016   Procedure: CARDIOVERSION;  Surgeon: Minna Merritts, MD;  Location: [place] ORS;  Service: Cardiovascular;  Laterality: N/A;  . CARDIOVERSION N/A 01/04/2017   Procedure: CARDIOVERSION;  Surgeon: Deboraha Sprang, MD;  Location: [facility] ENDOSCOPY;  Service: Cardiovascular;  Laterality: N/A;  . CARDIOVERSION N/A 03/31/2017   Procedure: CARDIOVERSION;  Surgeon: Larey Dresser, MD;  Location: [facility] ENDOSCOPY;  Service: Cardiovascular;  Laterality: N/A;  . COLONOSCOPY    . LUMBAR FUSION    . TOOTH EXTRACTION      There were no vitals filed for this visit.  Subjective Assessment - 09/09/17 0920    Subjective  Patient reports he did a lot of walking yesterday, but was able to do so without pain, but is noticing his endurance is getting better- but not up to the par that he wishes. Patient reports little pain over the past week . Patient reports that this morning he was sore from  yesterday, but this quickly subsided through the am. Patient reports he is enjoying his time at the pool.     Pertinent History  Patient is a pleasant 67 y/o male presenting s/p 1/30 posterior lumbar fusion (L4-5) following longstanding spondylolysis and stenosis. Patient ambulates w/ SPC and lumbar brace in the community, reports he does not use them at home. Patient reports that prior to sx he had numbness and tingling down both LE for years, that have now subsided post surgically. Patient reports no pain in the low back today and 0/10 pain throughout the past week. Patient is only taking tylenol at this time. Patient is wearing a lumbar brace that he reports wearing when he goes out in the community. Patient reports he is currently on lifting restrictions >5lbs and excessive ROM for unspecified time (patient is under the impression this is open to PT). Pt reports post surgically his knees have been increasingly bothering him after longstanding chronic pain, and that with prolonged sitting he feels as though he has to have his cane in order to stand up and walk safely. Patient reports that he feels unsteady walking, but that has become better since his sensation has increased post surgicall, but he feels as though his LEs will "give out".     Limitations  Standing  How long can you sit comfortably?  No limitations    How long can you stand comfortably?  10 minutes    Diagnostic tests  See MRI report, L4-L5 fusion    Patient Stated Goals  Be able to stand and walk for longer durations of time- would like to be able to do so w/o AD; would like to be able to perform in play this Summer          Ther-Ex - Nustep 83mn for warm up during hx intake -5x STS over 3 trials with patient exhibiting ability to complete transfer without UE support -Gait speed test over 3 trials patient was able to ambulate at a speed of 1.375m over 1010mo no new goals written for this -6MWT where patient was able to complete  test without any seated/standing breaks. Patient begins R hip drop after 4 mins d/t fatigue -Mini Squat 1x 15  -Standing abd 1x 15 w/ min cuing for eccentric control -Standing heel raises 1x 15 -Patient education on endurance training vs. Strength training and to decrease intensity of exercise so that he can accomplish 15-20reps for muscular endurance training and continuing aerobic training in the form of walking, biking, swimming, etc at low load, long duration.                   PT Education - 09/09/17 0954    Education provided  Yes    Education Details  HEP change rep/set range to endurance goals     Person(s) Educated  Patient    Methods  Explanation;Demonstration;Verbal cues    Comprehension  Verbal cues required;Verbalized understanding;Returned demonstration       PT Short Term Goals - 07/19/17 1524      PT SHORT TERM GOAL #1   Title  Pt will be independent with HEP in order to improve strength and balance in order to decrease fall risk and improve function at home and work.    Time  2    Period  Weeks    Status  New        PT Long Term Goals - 09/09/17 [id number]   PT LONG TERM GOAL #1   Title  Pt will be able to comfortably stand unsupported without N/T for 20 minutes to complete ADLs    Baseline  4/19 Able to stand for 15 minutes without unsteadiness or pain, only min fatigue    Time  8    Status  Partially Met      PT LONG TERM GOAL #2   Title  Pt will decrease 5TSTS by at least 3 seconds in order to demonstrate clinically significant improvement in LE strength    Baseline  2/26: 13.28sec w/o UE support    Time  8    Period  Weeks    Status  Achieved      PT LONG TERM GOAL #3   Title  Pt will increase 6MWT by at least [id number] order to demonstrate clinically significant improvement in cardiopulmonary endurance and community ambulation    Baseline  4/19: 1073ft 81fAD    Time  6    Status  Partially Met      PT LONG TERM GOAL #4   Title   Patient will increase gross hip strength to 4/5 bilat in order to complete safe squatting and stooping    Baseline  2/26: hip flexion: 4+/5 bilat; hip ext: 4/5 bilat; Hip IR 4+/5 bilat; Hip ER  4+/5 bilat; Hip abd 4+/5 bilat    Time  8    Period  Weeks    Status  Achieved            Plan - 09/09/17 1020    Clinical Impression Statement  Patient's goals were re-assessed today. It is this PT's assessment that patient is making strength gains with LE strengthening exercise, Patient's remaining deficits are more lack of endurance to complete prolonged standing and walking activites. Patient is making progress with aerobic and muscular endurace as well as evidenced by increasing his 6MWT distance by 137f and needing no rest breaks during test, but is still short of clinically significant increase in distance. PT + aquatic therapy will continue to work on endurance (increasing rep range of exercise to 15-20 reps, increased focus on aerobic activity) for a couple more weeks before d/c patient to HEP.     Rehab Potential  Fair    Clinical Impairments Affecting Rehab Potential  Negative- medical comorbidities     PT Frequency  2x / week    PT Duration  8 weeks    PT Treatment/Interventions  Aquatic Therapy;Gait training;Stair training;Therapeutic activities;Therapeutic exercise;Balance training;Manual techniques;Energy conservation;Functional mobility training;Moist Heat;Cryotherapy;ADLs/Self Care Home Management;Neuromuscular re-education;Passive range of motion;Taping;Patient/family education    PT Next Visit Plan  Strengthening with endurance focus    PT Home Exercise Plan  Mini squat, standing hip abd and flex, and standing heel raises at rep range of 15-20reps for endurance; aerobic walking program    Consulted and Agree with Plan of Care  Patient       Patient will benefit from skilled therapeutic intervention in order to improve the following deficits and impairments:  Abnormal gait,  Decreased balance, Decreased activity tolerance, Decreased endurance, Difficulty walking, Impaired sensation, Improper body mechanics, Decreased mobility, Decreased strength, Impaired flexibility  Visit Diagnosis: Muscle weakness (generalized)     Problem List Patient Active Problem List   Diagnosis Date Noted  . Spondylolisthesis of lumbar region 06/22/2017  . Visit for monitoring Tikosyn therapy 03/29/2017  . Hip pain 05/24/2015   CShelton SilvasPT, DPT CShelton Silvas4/19/2019, 1:55 PM  [place] [facility] [address] [place] [address]Phone: [phone]  Fax:  [phone] Name: Luke DUTCHMRN: [id number] of Birth: 101-26-1952

## 2017-09-12 ENCOUNTER — Encounter: Payer: Medicare Other | Admitting: Physical Therapy

## 2017-09-13 ENCOUNTER — Other Ambulatory Visit: Payer: Self-pay

## 2017-09-13 ENCOUNTER — Ambulatory Visit: Payer: Medicare Other

## 2017-09-13 DIAGNOSIS — M5416 Radiculopathy, lumbar region: Secondary | ICD-10-CM

## 2017-09-13 DIAGNOSIS — R262 Difficulty in walking, not elsewhere classified: Secondary | ICD-10-CM

## 2017-09-13 DIAGNOSIS — M6281 Muscle weakness (generalized): Secondary | ICD-10-CM | POA: Diagnosis not present

## 2017-09-13 NOTE — Therapy (Signed)
[place] MAIN [facility] SERVICES [address] [place], [place], [address] Phone: [phone]   Fax:  [phone]  Physical Therapy Treatment  Patient Details  Name: Luke Morgan MRN: [id number] Date of Birth: Jul 21, 1950 Referring Provider: Arnoldo Morale   Encounter Date: 09/13/2017  PT End of Session - 09/13/17 1508    Visit Number  16    Number of Visits  25    Date for PT Re-Evaluation  10/11/17    PT Start Time  0945    PT Stop Time  1035    PT Time Calculation (min)  50 min    Activity Tolerance  Patient tolerated treatment well    Behavior During Therapy  [facility] for tasks assessed/performed       Past Medical History:  Diagnosis Date  . Asthma   . Atrial fibrillation ([place])   . History of kidney stones   . Hypertension   . Kidney stones   . Obesity   . Osteoarthritis   . Pre-diabetes    patient is taking metformin  . Sleep apnea   . Spondylolisthesis of lumbar region     Past Surgical History:  Procedure Laterality Date  . CARDIOVERSION N/A 12/24/2016   Procedure: CARDIOVERSION;  Surgeon: Minna Merritts, MD;  Location: [facility];  Service: Cardiovascular;  Laterality: N/A;  . CARDIOVERSION N/A 01/04/2017   Procedure: CARDIOVERSION;  Surgeon: Deboraha Sprang, MD;  Location: [facility] ENDOSCOPY;  Service: Cardiovascular;  Laterality: N/A;  . CARDIOVERSION N/A 03/31/2017   Procedure: CARDIOVERSION;  Surgeon: Larey Dresser, MD;  Location: [facility] ENDOSCOPY;  Service: Cardiovascular;  Laterality: N/A;  . COLONOSCOPY    . LUMBAR FUSION    . TOOTH EXTRACTION      There were no vitals filed for this visit.  Subjective Assessment - 09/13/17 1503    Subjective  Pt reports little work felt last pool session as previous session, but did appreciate movement. Pt has only minimal back pain today, but did note greater pain over the weekend/yesterday both in back and L > R knee due to increased activity and prolonged sitting yesterday during choir  practice.    Pertinent History  Patient is a pleasant 67 y/o male presenting s/p 1/30 posterior lumbar fusion (L4-5) following longstanding spondylolysis and stenosis. Patient ambulates w/ SPC and lumbar brace in the community, reports he does not use them at home. Patient reports that prior to sx he had numbness and tingling down both LE for years, that have now subsided post surgically. Patient reports no pain in the low back today and 0/10 pain throughout the past week. Patient is only taking tylenol at this time. Patient is wearing a lumbar brace that he reports wearing when he goes out in the community. Patient reports he is currently on lifting restrictions >5lbs and excessive ROM for unspecified time (patient is under the impression this is open to PT). Pt reports post surgically his knees have been increasingly bothering him after longstanding chronic pain, and that with prolonged sitting he feels as though he has to have his cane in order to stand up and walk safely. Patient reports that he feels unsteady walking, but that has become better since his sensation has increased post surgicall, but he feels as though his LEs will "give out".       Enters/exits via ramp Participates in the following  Ambulation  4 L fwd  3 L side with squat  Fall to recover, 1 L  ea  Fwd  Side, B  Core with UE strength, 2 x 10 ea  Green dumbbells   Triceps press downs   Sh abd/add   Sh flex/ext   Sh horiz abd/add   Suspended work, increased time for education, approx 1 min ea with rest btn  Jog  Ski   Jack   X [employer], core with LE strength, 2 x 10 ea  1# wts   SKTC, B   ER hip with straight leg, small circles, B   SLR, up and outs, B                             PT Education - 09/13/17 1507    Education provided  Yes    Education Details  Fall to recover re education. core work with LE strength at bench, suspended work    [employer]) Educated  Patient    Methods   Explanation;Demonstration;Tactile cues;Verbal cues    Comprehension  Verbalized understanding;Need further instruction;Returned demonstration       PT Short Term Goals - 07/19/17 1524      PT SHORT TERM GOAL #1   Title  Pt will be independent with HEP in order to improve strength and balance in order to decrease fall risk and improve function at home and work.    Time  2    Period  Weeks    Status  New        PT Long Term Goals - 09/09/17 2841      PT LONG TERM GOAL #1   Title  Pt will be able to comfortably stand unsupported without N/T for 20 minutes to complete ADLs    Baseline  4/19 Able to stand for 15 minutes without unsteadiness or pain, only min fatigue    Time  8    Status  Partially Met      PT LONG TERM GOAL #2   Title  Pt will decrease 5TSTS by at least 3 seconds in order to demonstrate clinically significant improvement in LE strength    Baseline  2/26: 13.28sec w/o UE support    Time  8    Period  Weeks    Status  Achieved      PT LONG TERM GOAL #3   Title  Pt will increase 6MWT by at least 11m(1635f in order to demonstrate clinically significant improvement in cardiopulmonary endurance and community ambulation    Baseline  4/19: 107317f/o AD    Time  6    Status  Partially Met      PT LONG TERM GOAL #4   Title  Patient will increase gross hip strength to 4/5 bilat in order to complete safe squatting and stooping    Baseline  2/26: hip flexion: 4+/5 bilat; hip ext: 4/5 bilat; Hip IR 4+/5 bilat; Hip ER 4+/5 bilat; Hip abd 4+/5 bilat    Time  8    Period  Weeks    Status  Achieved            Plan - 09/13/17 1509    Clinical Impression Statement  Pt demonstrating understanding concept of suspended work and improved technique during session; pt does require rest periods btn each exercise due to cardiovascular work and feeling mildly winded. Re education required throughout, as it has been some time since this therapist has seen pt. Will continue to  progress core/LE strengthening and well as endurance  Rehab Potential  Fair    Clinical Impairments Affecting Rehab Potential  Negative- medical comorbidities     PT Frequency  2x / week    PT Duration  8 weeks    PT Treatment/Interventions  Aquatic Therapy;Gait training;Stair training;Therapeutic activities;Therapeutic exercise;Balance training;Manual techniques;Energy conservation;Functional mobility training;Moist Heat;Cryotherapy;ADLs/Self Care Home Management;Neuromuscular re-education;Passive range of motion;Taping;Patient/family education    PT Next Visit Plan  Strengthening with endurance focus    PT Home Exercise Plan  Mini squat, standing hip abd and flex, and standing heel raises at rep range of 15-20reps for endurance; aerobic walking program    Consulted and Agree with Plan of Care  Patient       Patient will benefit from skilled therapeutic intervention in order to improve the following deficits and impairments:  Abnormal gait, Decreased balance, Decreased activity tolerance, Decreased endurance, Difficulty walking, Impaired sensation, Improper body mechanics, Decreased mobility, Decreased strength, Impaired flexibility  Visit Diagnosis: Muscle weakness (generalized)  Difficulty in walking, not elsewhere classified  Radiculopathy, lumbar region     Problem List Patient Active Problem List   Diagnosis Date Noted  . Spondylolisthesis of lumbar region 06/22/2017  . Visit for monitoring Tikosyn therapy 03/29/2017  . Hip pain 05/24/2015    Larae Grooms 09/13/2017, 3:12 PM  [place] MAIN [facility] SERVICES [address] [place], [place], [address] Phone: [phone]   Fax:  [phone]  Name: Luke Morgan MRN: [id number] Date of Birth: [birth year]

## 2017-09-14 ENCOUNTER — Ambulatory Visit: Payer: Medicare Other | Admitting: Physical Therapy

## 2017-09-14 ENCOUNTER — Encounter: Payer: Self-pay | Admitting: Physical Therapy

## 2017-09-14 DIAGNOSIS — M6281 Muscle weakness (generalized): Secondary | ICD-10-CM

## 2017-09-14 NOTE — Therapy (Signed)
[place] [facility] [address] [place], [address] Phone: [phone]   Fax:  [phone]  Physical Therapy Treatment  Patient Details  Name: Luke Morgan MRN: [id number] Date of Birth: 07/07/1950 Referring Provider: Arnoldo Morale   Encounter Date: 09/14/2017  PT End of Session - 09/14/17 1123    Visit Number  17    Number of Visits  25    Date for PT Re-Evaluation  10/11/17    PT Start Time  1115    PT Stop Time  1200    PT Time Calculation (min)  45 min    Activity Tolerance  Patient tolerated treatment well    Behavior During Therapy  [facility] for tasks assessed/performed       Past Medical History:  Diagnosis Date  . Asthma   . Atrial fibrillation ([place])   . History of kidney stones   . Hypertension   . Kidney stones   . Obesity   . Osteoarthritis   . Pre-diabetes    patient is taking metformin  . Sleep apnea   . Spondylolisthesis of lumbar region     Past Surgical History:  Procedure Laterality Date  . CARDIOVERSION N/A 12/24/2016   Procedure: CARDIOVERSION;  Surgeon: Minna Merritts, MD;  Location: [facility];  Service: Cardiovascular;  Laterality: N/A;  . CARDIOVERSION N/A 01/04/2017   Procedure: CARDIOVERSION;  Surgeon: Deboraha Sprang, MD;  Location: [facility] ENDOSCOPY;  Service: Cardiovascular;  Laterality: N/A;  . CARDIOVERSION N/A 03/31/2017   Procedure: CARDIOVERSION;  Surgeon: Larey Dresser, MD;  Location: [facility] ENDOSCOPY;  Service: Cardiovascular;  Laterality: N/A;  . COLONOSCOPY    . LUMBAR FUSION    . TOOTH EXTRACTION      There were no vitals filed for this visit.  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 Patient reports he did "a lot of core exercises" in the pool which he reported were "challenging". Patient reports no pain today, only  stiffness this morning, that quickly subsided after his shower. Patient reports non-compliance with HEP    Pertinent History  Patient is a pleasant 67y/o male presenting s/p 1/30 posterior lumbar fusion (L4-5) following longstanding spondylolysis and stenosis. Patient ambulates w/ SPC and lumbar brace in the community, reports he does not use them at home. Patient reports that prior to sx he had numbness and tingling down both LE for years, that have now subsided post surgically. Patient reports no pain in the low back today and 0/10 pain throughout the past week. Patient is only taking tylenol at this time. Patient is wearing a lumbar brace that he reports wearing when he goes out in the community. Patient reports he is currently on lifting restrictions >5lbs and excessive ROM for unspecified time (patient is under the impression this is open to PT). Pt reports post surgically his knees have been increasingly bothering him after longstanding chronic pain, and that with prolonged sitting he feels as though he has to have his cane in order to stand up and walk safely. Patient reports that he feels unsteady walking, but that has become better since his sensation has increased post surgicall, but he feels as though his LEs will "give out".     Limitations  Standing    How long can you sit  comfortably?  No limitations    How long can you stand comfortably?  10 minutes    Diagnostic tests  See MRI report, L4-L5 fusion    Patient Stated Goals  Be able to stand and walk for longer durations of time- would like to be able to do so w/o AD; would like to be able to perform in play this Summer       Ther-Ex -Mini squat 3x 15 with education on rep range above 15 for muscular endurance with seated rest break after 2nd set -Standing hip abd 3x 17e with cuing to use single leg LE for support without UE support as patient continues to report the exercise is "not very difficult because I am putting weight through my  hands" and for eccentric control -Standing hip ext 3x 17 with unilateral UE support for balance and cuing for eccentric contol -Mini lunge exercise 2x 15 with unilateral handrail support with R LE in front and bilat handrail support with L LE in front with PT cuing for proper form without excessive forward translation and proper sequencing  -Heel raises 3x 18 with cuing for eccentric lowering   BP taken 2 mins post there-ex:    *Patient with seated rest breaks between most sets; PT provided education on attempting to take standing rest breaks as opposed to seated to encourage endurance*                    PT Education - 09/14/17 1122    Education provided  Yes    Education Details  Exercise form    Person(s) Educated  Patient    Methods  Explanation;Demonstration;Verbal cues    Comprehension  Verbal cues required;Verbalized understanding;Returned demonstration       PT Short Term Goals - 07/19/17 1524      PT SHORT TERM GOAL #1   Title  Pt will be independent with HEP in order to improve strength and balance in order to decrease fall risk and improve function at home and work.    Time  2    Period  Weeks    Status  New        PT Long Term Goals - 09/09/17 0086      PT LONG TERM GOAL #1   Title  Pt will be able to comfortably stand unsupported without N/T for 20 minutes to complete ADLs    Baseline  4/19 Able to stand for 15 minutes without unsteadiness or pain, only min fatigue    Time  8    Status  Partially Met      PT LONG TERM GOAL #2   Title  Pt will decrease 5TSTS by at least 3 seconds in order to demonstrate clinically significant improvement in LE strength    Baseline  2/26: 13.28sec w/o UE support    Time  8    Period  Weeks    Status  Achieved      PT LONG TERM GOAL #3   Title  Pt will increase 6MWT by at least 68m(1684f in order to demonstrate clinically significant improvement in cardiopulmonary endurance and community ambulation     Baseline  4/19: 10732f/o AD    Time  6    Status  Partially Met      PT LONG TERM GOAL #4   Title  Patient will increase gross hip strength to 4/5 bilat in order to complete safe squatting and stooping    Baseline  2/26:  hip flexion: 4+/5 bilat; hip ext: 4/5 bilat; Hip IR 4+/5 bilat; Hip ER 4+/5 bilat; Hip abd 4+/5 bilat    Time  8    Period  Weeks    Status  Achieved            Plan - 09/14/17 1133    Clinical Impression Statement  Pt tolerated endurance rep range for exercise with extreme fatigue, but no noted pain. Patient reported during standing abduction that increasing reps "did not make the exercise much harder because he was bearing his weight through his hands" and PT encouraged patient to use UE as little as possible for "balance only". Patient wanted to take seated rest breaks following all sets and PT encouraged patient to attempt to take standing rest breaks to encourage endurance of LE, patient was unsuccessful in taking this suggestion. Patient continues to report non-compliance with HEP, and PT continued to express the importance of consistency with muscular strength and endurance gains. PT expressed to patient that next week he should be able to transition to home ther-ex program. Patient reported that he would like to keep coming to pool/land therapy to "workout", and PT educated patient on PT role in rehabilitation and the role of exercise motivation would be better suited for a personal trainer, but that PT will await orthopedic results from visit next week before making d/c reccomendations.    Rehab Potential  Fair    Clinical Impairments Affecting Rehab Potential  Negative- medical comorbidities     PT Frequency  2x / week    PT Duration  8 weeks    PT Treatment/Interventions  Aquatic Therapy;Gait training;Stair training;Therapeutic activities;Therapeutic exercise;Balance training;Manual techniques;Energy conservation;Functional mobility training;Moist  Heat;Cryotherapy;ADLs/Self Care Home Management;Neuromuscular re-education;Passive range of motion;Taping;Patient/family education    PT Next Visit Plan  Strengthening with endurance focus    PT Home Exercise Plan  Mini squat, standing hip abd and flex, and standing heel raises at rep range of 15-20reps for endurance; aerobic walking program    Consulted and Agree with Plan of Care  Patient       Patient will benefit from skilled therapeutic intervention in order to improve the following deficits and impairments:  Abnormal gait, Decreased balance, Decreased activity tolerance, Decreased endurance, Difficulty walking, Impaired sensation, Improper body mechanics, Decreased mobility, Decreased strength, Impaired flexibility  Visit Diagnosis: Muscle weakness (generalized)     Problem List Patient Active Problem List   Diagnosis Date Noted  . Spondylolisthesis of lumbar region 06/22/2017  . Visit for monitoring Tikosyn therapy 03/29/2017  . Hip pain 05/24/2015   Shelton Silvas PT, DPT Shelton Silvas 09/14/2017, 1:14 PM   [place] [facility] [address] [place], [address] Phone: [phone]   Fax:  [phone]  Name: Luke Morgan MRN: [id number] Date of Birth: 23-Oct-1950

## 2017-09-20 ENCOUNTER — Ambulatory Visit: Payer: Medicare Other

## 2017-09-20 ENCOUNTER — Other Ambulatory Visit: Payer: Self-pay

## 2017-09-20 DIAGNOSIS — M6281 Muscle weakness (generalized): Secondary | ICD-10-CM

## 2017-09-20 DIAGNOSIS — M5416 Radiculopathy, lumbar region: Secondary | ICD-10-CM

## 2017-09-20 DIAGNOSIS — R262 Difficulty in walking, not elsewhere classified: Secondary | ICD-10-CM

## 2017-09-20 NOTE — Therapy (Signed)
[place] MAIN [facility] SERVICES [address] [place], [place], [address] Phone: [phone]   Fax:  [phone]  Physical Therapy Treatment  Patient Details  Name: TAJAE RYBICKI MRN: [id number] Date of Birth: 02-21-1951 Referring Provider: Arnoldo Morale   Encounter Date: 09/20/2017  PT End of Session - 09/20/17 1600    Visit Number  18    Number of Visits  25    Date for PT Re-Evaluation  10/11/17    PT Start Time  1030    PT Stop Time  1115    PT Time Calculation (min)  45 min    Activity Tolerance  Patient tolerated treatment well    Behavior During Therapy  [facility] for tasks assessed/performed       Past Medical History:  Diagnosis Date  . Asthma   . Atrial fibrillation ([place])   . History of kidney stones   . Hypertension   . Kidney stones   . Obesity   . Osteoarthritis   . Pre-diabetes    patient is taking metformin  . Sleep apnea   . Spondylolisthesis of lumbar region     Past Surgical History:  Procedure Laterality Date  . CARDIOVERSION N/A 12/24/2016   Procedure: CARDIOVERSION;  Surgeon: Minna Merritts, MD;  Location: [facility] [facility];  Service: Cardiovascular;  Laterality: N/A;  . CARDIOVERSION N/A 01/04/2017   Procedure: CARDIOVERSION;  Surgeon: Deboraha Sprang, MD;  Location: [facility] ENDOSCOPY;  Service: Cardiovascular;  Laterality: N/A;  . CARDIOVERSION N/A 03/31/2017   Procedure: CARDIOVERSION;  Surgeon: Larey Dresser, MD;  Location: [facility] ENDOSCOPY;  Service: Cardiovascular;  Laterality: N/A;  . COLONOSCOPY    . LUMBAR FUSION    . TOOTH EXTRACTION      There were no vitals filed for this visit.  Subjective Assessment - 09/20/17 1559    Subjective  Also notes some continued balance problems with walking.       Enters/exits via ramp Participates in the following  Ambulation  4 L fwd  4 L side step with squat  Suspended work, 75 seconds ea (5 min total)  Jog  Jack  Ski  X jack  Active recovery  1 L walk   Static  march (focus on abdominal crunch) with ball (UE flex/ext), 1 min  Core with LE strengthening  Hip abd/add, 30x B  Hip flex/ext, 30x B  Suspended work as above  Active recovery walk  Core with UE strength, blue dumbbells  Triceps press downs, 2 x 10  Sh abd/add, 10x  Sh flex/ext, 1 blue dumbbell, 15x  Balance activities  Side stepping with ball toss, 4 L  Dolphin race, 2 L  SLstance ball toss, B                         PT Education - 09/20/17 1557    Education provided  Yes    Education Details  Continued suspended work, Teacher, adult education of interval training with use of circuits of cardio and strength training. Balance exercises/activities to perform with spouse in water.        PT Short Term Goals - 07/19/17 1524      PT SHORT TERM GOAL #1   Title  Pt will be independent with HEP in order to improve strength and balance in order to decrease fall risk and improve function at home and work.    Time  2    Period  Weeks  Status  New        PT Long Term Goals - 09/09/17 1660      PT LONG TERM GOAL #1   Title  Pt will be able to comfortably stand unsupported without N/T for 20 minutes to complete ADLs    Baseline  4/19 Able to stand for 15 minutes without unsteadiness or pain, only min fatigue    Time  8    Status  Partially Met      PT LONG TERM GOAL #2   Title  Pt will decrease 5TSTS by at least 3 seconds in order to demonstrate clinically significant improvement in LE strength    Baseline  2/26: 13.28sec w/o UE support    Time  8    Period  Weeks    Status  Achieved      PT LONG TERM GOAL #3   Title  Pt will increase 6MWT by at least 43m(1637f in order to demonstrate clinically significant improvement in cardiopulmonary endurance and community ambulation    Baseline  4/19: 107334f/o AD    Time  6    Status  Partially Met      PT LONG TERM GOAL #4   Title  Patient will increase gross hip strength to 4/5 bilat in order to complete safe  squatting and stooping    Baseline  2/26: hip flexion: 4+/5 bilat; hip ext: 4/5 bilat; Hip IR 4+/5 bilat; Hip ER 4+/5 bilat; Hip abd 4+/5 bilat    Time  8    Period  Weeks    Status  Achieved            Plan - 09/20/17 1600    Clinical Impression Statement  Pt able to increase suspended work by a total of a minute (15 sec) each exercise with improved range and use of LE/UE. Pt able to tolerate challenge with active recovery periods of walking and strength circuits. Progressed core exercise with UE work resistance. Performed static and dynamic balance activities with spouse.     Rehab Potential  Fair    Clinical Impairments Affecting Rehab Potential  Negative- medical comorbidities     PT Frequency  2x / week    PT Duration  8 weeks    PT Treatment/Interventions  Aquatic Therapy;Gait training;Stair training;Therapeutic activities;Therapeutic exercise;Balance training;Manual techniques;Energy conservation;Functional mobility training;Moist Heat;Cryotherapy;ADLs/Self Care Home Management;Neuromuscular re-education;Passive range of motion;Taping;Patient/family education    PT Next Visit Plan  Strengthening with endurance focus    PT Home Exercise Plan  Mini squat, standing hip abd and flex, and standing heel raises at rep range of 15-20reps for endurance; aerobic walking program    Consulted and Agree with Plan of Care  Patient       Patient will benefit from skilled therapeutic intervention in order to improve the following deficits and impairments:  Abnormal gait, Decreased balance, Decreased activity tolerance, Decreased endurance, Difficulty walking, Impaired sensation, Improper body mechanics, Decreased mobility, Decreased strength, Impaired flexibility  Visit Diagnosis: Muscle weakness (generalized)  Difficulty in walking, not elsewhere classified  Radiculopathy, lumbar region     Problem List Patient Active Problem List   Diagnosis Date Noted  . Spondylolisthesis of lumbar  region 06/22/2017  . Visit for monitoring Tikosyn therapy 03/29/2017  . Hip pain 05/24/2015    HeiLarae Grooms30/2019, 4:04 PM  [facility] [address] [place]: [phone]:  [phone]: WilJakarie Pember  Teshaun Olarte MRN: [id number] Date of Birth: [birth year]

## 2017-09-22 ENCOUNTER — Ambulatory Visit: Payer: Medicare Other | Attending: Internal Medicine | Admitting: Physical Therapy

## 2017-09-22 ENCOUNTER — Encounter: Payer: Self-pay | Admitting: Physical Therapy

## 2017-09-22 DIAGNOSIS — R262 Difficulty in walking, not elsewhere classified: Secondary | ICD-10-CM | POA: Diagnosis present

## 2017-09-22 DIAGNOSIS — M5416 Radiculopathy, lumbar region: Secondary | ICD-10-CM | POA: Diagnosis present

## 2017-09-22 DIAGNOSIS — M6281 Muscle weakness (generalized): Secondary | ICD-10-CM | POA: Insufficient documentation

## 2017-09-22 NOTE — Therapy (Signed)
[facility] [address] [place], [address] Phone: [phone]   Fax:  [phone]  Physical Therapy Treatment  Patient Details  Name: Luke Morgan MRN: [id number] Date of Birth: January 13, 1951 Referring Provider: Arnoldo Morale   Encounter Date: 09/22/2017  PT End of Session - 09/22/17 1054    Visit Number  19    Number of Visits  25    Date for PT Re-Evaluation  10/11/17    PT Start Time  1032    PT Stop Time  1116    PT Time Calculation (min)  44 min    Activity Tolerance  Patient tolerated treatment well    Behavior During Therapy  [facility] for tasks assessed/performed       Past Medical History:  Diagnosis Date  . Asthma   . Atrial fibrillation ([place])   . History of kidney stones   . Hypertension   . Kidney stones   . Obesity   . Osteoarthritis   . Pre-diabetes    patient is taking metformin  . Sleep apnea   . Spondylolisthesis of lumbar region     Past Surgical History:  Procedure Laterality Date  . CARDIOVERSION N/A 12/24/2016   Procedure: CARDIOVERSION;  Surgeon: Minna Merritts, MD;  Location: [facility];  Service: Cardiovascular;  Laterality: N/A;  . CARDIOVERSION N/A 01/04/2017   Procedure: CARDIOVERSION;  Surgeon: Deboraha Sprang, MD;  Location: [facility] ENDOSCOPY;  Service: Cardiovascular;  Laterality: N/A;  . CARDIOVERSION N/A 03/31/2017   Procedure: CARDIOVERSION;  Surgeon: Larey Dresser, MD;  Location: [facility] ENDOSCOPY;  Service: Cardiovascular;  Laterality: N/A;  . COLONOSCOPY    . LUMBAR FUSION    . TOOTH EXTRACTION      There were no vitals filed for this visit.  Subjective Assessment - 09/22/17 1039    Subjective  Patient reports that he went to his ortho doctor who told him he "needed to lose 40lbs" before he could safely have a knee operation. Patient reports no pain today, just "uncomfortable stiffness in the knees" with transitional movements and early in the am.     Patient is  accompained by:  Family member    Pertinent History  Patient is a pleasant 67 y/o male presenting s/p 1/30 posterior lumbar fusion (L4-5) following longstanding spondylolysis and stenosis. Patient ambulates w/ SPC and lumbar brace in the community, reports he does not use them at home. Patient reports that prior to sx he had numbness and tingling down both LE for years, that have now subsided post surgically. Patient reports no pain in the low back today and 0/10 pain throughout the past week. Patient is only taking tylenol at this time. Patient is wearing a lumbar brace that he reports wearing when he goes out in the community. Patient reports he is currently on lifting restrictions >5lbs and excessive ROM for unspecified time (patient is under the impression this is open to PT). Pt reports post surgically his knees have been increasingly bothering him after longstanding chronic pain, and that with prolonged sitting he feels as though he has to have his cane in order to stand up and walk safely. Patient reports that he feels unsteady walking, but that has become better since his sensation has increased post surgicall, but he feels as though his LEs will "give out".     Limitations  Standing    How long can you sit comfortably?  No limitations    How long  can you stand comfortably?  10 minutes    Diagnostic tests  See MRI report, L4-L5 fusion    Patient Stated Goals  Be able to stand and walk for longer durations of time- would like to be able to do so w/o AD; would like to be able to perform in play this Summer          Ther-Ex -Nustep 39mn during education on aerobic fitness exercise for weight loss patient was able to tolerate exercise up to level 5 with max HR during aerobic exercise 143bpm per Nustep -Power stair step-ups 4 rounds of 4 steps x4 sets with cuing for glute contraction "explosiveness" and maintaining alternating step -4" stair stepping "up/up/down/down" leading with R foot 3x 10 with  cuing for eccentric control -Squat thrusters with 6# dumbbell in bilat UE 3x 15 with  -MATRIX standing hip abd 1x 12e 40# 2x 10 55# with cuing for posture -MATRIX standing hip ext 1x 12e 55# 1x 12 70# 1x 10 85#with cuing for full hip ext and eccentric control                      PT Education - 09/22/17 1048    Education provided  Yes    Education Details  Education on exercise for weight loss, standing rest breaks to maintain increased HR, and exercise form    Person(s) Educated  Patient    Methods  Explanation;Demonstration;Verbal cues    Comprehension  Verbal cues required;Need further instruction;Verbalized understanding;Returned demonstration       PT Short Term Goals - 07/19/17 1524      PT SHORT TERM GOAL #1   Title  Pt will be independent with HEP in order to improve strength and balance in order to decrease fall risk and improve function at home and work.    Time  2    Period  Weeks    Status  New        PT Long Term Goals - 09/09/17 03295     PT LONG TERM GOAL #1   Title  Pt will be able to comfortably stand unsupported without N/T for 20 minutes to complete ADLs    Baseline  4/19 Able to stand for 15 minutes without unsteadiness or pain, only min fatigue    Time  8    Status  Partially Met      PT LONG TERM GOAL #2   Title  Pt will decrease 5TSTS by at least 3 seconds in order to demonstrate clinically significant improvement in LE strength    Baseline  2/26: 13.28sec w/o UE support    Time  8    Period  Weeks    Status  Achieved      PT LONG TERM GOAL #3   Title  Pt will increase 6MWT by at least [id number] order to demonstrate clinically significant improvement in cardiopulmonary endurance and community ambulation    Baseline  4/19: [id number] AD    Time  6    Status  Partially Met      PT LONG TERM GOAL #4   Title  Patient will increase gross hip strength to 4/5 bilat in order to complete safe squatting and stooping    Baseline   2/26: hip flexion: 4+/5 bilat; hip ext: 4/5 bilat; Hip IR 4+/5 bilat; Hip ER 4+/5 bilat; Hip abd 4+/5 bilat    Time  8    Period  Weeks  Status  Achieved            Plan - 09/22/17 1147    Clinical Impression Statement  PT led patient through therex with increased aerobic and strength demand, which patient was able to complete with correct form with min cuing from PT, needing encouragement to continue reps through mild fatigue. PT educated patient to take standing (more active rest break) to maintain elevated HR and increase calorie burn. PT and patient discussed ending therapy at the end of this month and transitioning to HEP at the gym or at home. PT provided resources on [employer] silver sneakers and senior discount, and [employer] $10 offer. PT advised patient to find a HEP enviornment that will work best for him (home or gym) and that PT will work on developing an HEP for which ever environment he chooses.     Rehab Potential  Fair    Clinical Impairments Affecting Rehab Potential  Negative- medical comorbidities     PT Frequency  2x / week    PT Duration  8 weeks    PT Treatment/Interventions  Aquatic Therapy;Gait training;Stair training;Therapeutic activities;Therapeutic exercise;Balance training;Manual techniques;Energy conservation;Functional mobility training;Moist Heat;Cryotherapy;ADLs/Self Care Home Management;Neuromuscular re-education;Passive range of motion;Taping;Patient/family education    PT Next Visit Plan  Strengthening with endurance focus    PT Home Exercise Plan  Mini squat, standing hip abd and flex, and standing heel raises at rep range of 15-20reps for endurance; aerobic walking program    Consulted and Agree with Plan of Care  Patient       Patient will benefit from skilled therapeutic intervention in order to improve the following deficits and impairments:  Abnormal gait, Decreased balance, Decreased activity tolerance, Decreased endurance, Difficulty walking,  Impaired sensation, Improper body mechanics, Decreased mobility, Decreased strength, Impaired flexibility  Visit Diagnosis: Muscle weakness (generalized)     Problem List Patient Active Problem List   Diagnosis Date Noted  . Spondylolisthesis of lumbar region 06/22/2017  . Visit for monitoring Tikosyn therapy 03/29/2017  . Hip pain 05/24/2015   Shelton Silvas PT, DPT Shelton Silvas 09/22/2017, 12:20 PM  [place] [facility] [address], [place], [address] Phone: [phone]   Fax:  [phone]  Name: Luke Morgan MRN: [id number] Date of Birth: 06/23/1950

## 2017-09-27 ENCOUNTER — Other Ambulatory Visit: Payer: Self-pay

## 2017-09-27 ENCOUNTER — Ambulatory Visit: Payer: Medicare Other

## 2017-09-27 DIAGNOSIS — M6281 Muscle weakness (generalized): Secondary | ICD-10-CM

## 2017-09-27 DIAGNOSIS — R262 Difficulty in walking, not elsewhere classified: Secondary | ICD-10-CM

## 2017-09-27 DIAGNOSIS — M5416 Radiculopathy, lumbar region: Secondary | ICD-10-CM

## 2017-09-27 NOTE — Therapy (Signed)
[facility] [address] [place], [place], [address] Phone: [phone]   Fax:  [phone]  Physical Therapy Treatment  Patient Details  Name: Luke Morgan MRN: [id number] Date of Birth: 09/28/1950 Referring Provider: Jenkins   Encounter Date: 09/27/2017  PT End of Session - 09/27/17 1420    Visit Number  20    Number of Visits  25    Date for PT Re-Evaluation  10/11/17    PT Start Time  0810    PT Stop Time  0900    PT Time Calculation (min)  50 min    Activity Tolerance  Patient tolerated treatment well    Behavior During Therapy  WFL for tasks assessed/performed       Past Medical History:  Diagnosis Date  . Asthma   . Atrial fibrillation ([facility])   . History of kidney stones   . Hypertension   . Kidney stones   . Obesity   . Osteoarthritis   . Pre-diabetes    patient is taking metformin  . Sleep apnea   . Spondylolisthesis of lumbar region     Past Surgical History:  Procedure Laterality Date  . CARDIOVERSION N/A 12/24/2016   Procedure: CARDIOVERSION;  Surgeon: Gollan, Timothy J, MD;  Location: [facility];  Service: Cardiovascular;  Laterality: N/A;  . CARDIOVERSION N/A 01/04/2017   Procedure: CARDIOVERSION;  Surgeon: Klein, Steven C, MD;  Location: [facility] ENDOSCOPY;  Service: Cardiovascular;  Laterality: N/A;  . CARDIOVERSION N/A 03/31/2017   Procedure: CARDIOVERSION;  Surgeon: McLean, Wert S, MD;  Location: [facility] ENDOSCOPY;  Service: Cardiovascular;  Laterality: N/A;  . COLONOSCOPY    . LUMBAR FUSION    . TOOTH EXTRACTION      There were no vitals filed for this visit.  Subjective Assessment - 09/27/17 1416    Subjective  Pt reports "feeling I had done work" after last session, but no adverse affects. Pt has not had any further middle of the night pain episodes in back. Currently no pain.     Patient is accompained by:  Family member    Pertinent History  Patient is a pleasant 67 y/o male presenting  s/p 1/30 posterior lumbar fusion (L4-5) following longstanding spondylolysis and stenosis. Patient ambulates w/ SPC and lumbar brace in the community, reports he does not use them at home. Patient reports that prior to sx he had numbness and tingling down both LE for years, that have now subsided post surgically. Patient reports no pain in the low back today and 0/10 pain throughout the past week. Patient is only taking tylenol at this time. Patient is wearing a lumbar brace that he reports wearing when he goes out in the community. Patient reports he is currently on lifting restrictions >5lbs and excessive ROM for unspecified time (patient is under the impression this is open to PT). Pt reports post surgically his knees have been increasingly bothering him after longstanding chronic pain, and that with prolonged sitting he feels as though he has to have his cane in order to stand up and walk safely. Patient reports that he feels unsteady walking, but that has become better since his sensation has increased post surgicall, but he feels as though his LEs will "give out".       Enters/exits via ramp Participates in the following   Ambulation  2 L fwd  2 L sidestep with squat  2 L fwd mini lunge  Suspended work 75 seconds each,   no rest beween (5 min total)  jog  jack  ski  X jack  1 L active recovery walk  Core with UE work at wall (shoulders in water)  Blue dumbbells, 2 x 10 ea   Triceps ext   Sh abd/add Green dumbbells, 2 x 10 ea   Sh flex/ext   Sh horiz abd/add  Suspended work as above  2 L active recovery walk  Core, Fall to recover (double noodle), 1 L each  Fwd  Side, B  Balance  Side stepping with ball toss, 3 L  SLS with ball toss, R/L 2 min each                           PT Education - 09/27/17 1418    Education provided  Yes    Education Details  Continued core work, balance work and suspended work emphasizing work with active rest periods to  allow for sufficient exercise to support weight loss and strengthening program.     Person(s) Educated  Patient    Methods  Explanation;Demonstration;Verbal cues    Comprehension  Verbalized understanding;Returned demonstration;Verbal cues required       PT Short Term Goals - 07/19/17 1524      PT SHORT TERM GOAL #1   Title  Pt will be independent with HEP in order to improve strength and balance in order to decrease fall risk and improve function at home and work.    Time  2    Period  Weeks    Status  New        PT Long Term Goals - 09/09/17 5631      PT LONG TERM GOAL #1   Title  Pt will be able to comfortably stand unsupported without N/T for 20 minutes to complete ADLs    Baseline  4/19 Able to stand for 15 minutes without unsteadiness or pain, only min fatigue    Time  8    Status  Partially Met      PT LONG TERM GOAL #2   Title  Pt will decrease 5TSTS by at least 3 seconds in order to demonstrate clinically significant improvement in LE strength    Baseline  2/26: 13.28sec w/o UE support    Time  8    Period  Weeks    Status  Achieved      PT LONG TERM GOAL #3   Title  Pt will increase 6MWT by at least 36m(166f in order to demonstrate clinically significant improvement in cardiopulmonary endurance and community ambulation    Baseline  4/19: [id number] AD    Time  6    Status  Partially Met      PT LONG TERM GOAL #4   Title  Patient will increase gross hip strength to 4/5 bilat in order to complete safe squatting and stooping    Baseline  2/26: hip flexion: 4+/5 bilat; hip ext: 4/5 bilat; Hip IR 4+/5 bilat; Hip ER 4+/5 bilat; Hip abd 4+/5 bilat    Time  8    Period  Weeks    Status  Achieved            Plan - 09/27/17 1423    Clinical Impression Statement  Pt tolerated session well requiring cues for appropiate work levels with suspended work (more cardio work), core stabilization with exercise/activity and active rest period work by walking versus  standing at pool edge as well  as control over breathing. Pt improves all with corrections as needed. Educating pt on learning perceived work of exertion and responses along with modifications to slow work as needed versus abandoning all together. Continue to improve progress work ability to include 3 rounds of cardio burst versus 2, core strengthening and BLE strength to improve overall fitness, balance and strength.     Rehab Potential  Fair    Clinical Impairments Affecting Rehab Potential  Negative- medical comorbidities     PT Frequency  2x / week    PT Duration  8 weeks    PT Treatment/Interventions  Aquatic Therapy;Gait training;Stair training;Therapeutic activities;Therapeutic exercise;Balance training;Manual techniques;Energy conservation;Functional mobility training;Moist Heat;Cryotherapy;ADLs/Self Care Home Management;Neuromuscular re-education;Passive range of motion;Taping;Patient/family education    PT Next Visit Plan  Strengthening with endurance focus    PT Home Exercise Plan  Mini squat, standing hip abd and flex, and standing heel raises at rep range of 15-20reps for endurance; aerobic walking program    Consulted and Agree with Plan of Care  Patient       Patient will benefit from skilled therapeutic intervention in order to improve the following deficits and impairments:  Abnormal gait, Decreased balance, Decreased activity tolerance, Decreased endurance, Difficulty walking, Impaired sensation, Improper body mechanics, Decreased mobility, Decreased strength, Impaired flexibility  Visit Diagnosis: Muscle weakness (generalized)  Difficulty in walking, not elsewhere classified  Radiculopathy, lumbar region     Problem List Patient Active Problem List   Diagnosis Date Noted  . Spondylolisthesis of lumbar region 06/22/2017  . Visit for monitoring Tikosyn therapy 03/29/2017  . Hip pain 05/24/2015    Heidi E Barnes 09/27/2017, 2:30 PM  [facility] [address] [place], [place], [address] Phone: [phone]   Fax:  [phone]  Name: Merlen R Koerber Morgan MRN: [id number] Date of Birth: 11/28/1950   

## 2017-09-29 ENCOUNTER — Ambulatory Visit: Payer: Medicare Other | Admitting: Physical Therapy

## 2017-09-29 ENCOUNTER — Encounter: Payer: Self-pay | Admitting: Physical Therapy

## 2017-09-29 DIAGNOSIS — M6281 Muscle weakness (generalized): Secondary | ICD-10-CM | POA: Diagnosis not present

## 2017-09-29 NOTE — Therapy (Signed)
[place] [address], [place], [address] Phone: [phone]   Fax:  [phone]  Physical Therapy Treatment  Patient Details  Name: Luke Morgan MRN: [id number] Date of Birth: 1950/06/15 Referring Provider: Arnoldo Morale   Encounter Date: 09/29/2017  PT End of Session - 09/29/17 1349    Visit Number  21    Number of Visits  25    Date for PT Re-Evaluation  10/11/17    PT Start Time  0132    PT Stop Time  0215    PT Time Calculation (min)  43 min    Activity Tolerance  Patient tolerated treatment well    Behavior During Therapy  [facility] for tasks assessed/performed       Past Medical History:  Diagnosis Date  . Asthma   . Atrial fibrillation ([place])   . History of kidney stones   . Hypertension   . Kidney stones   . Obesity   . Osteoarthritis   . Pre-diabetes    patient is taking metformin  . Sleep apnea   . Spondylolisthesis of lumbar region     Past Surgical History:  Procedure Laterality Date  . CARDIOVERSION N/A 12/24/2016   Procedure: CARDIOVERSION;  Surgeon: Minna Merritts, MD;  Location: [place] [facility];  Service: Cardiovascular;  Laterality: N/A;  . CARDIOVERSION N/A 01/04/2017   Procedure: CARDIOVERSION;  Surgeon: Deboraha Sprang, MD;  Location: [facility] ENDOSCOPY;  Service: Cardiovascular;  Laterality: N/A;  . CARDIOVERSION N/A 03/31/2017   Procedure: CARDIOVERSION;  Surgeon: Larey Dresser, MD;  Location: [facility];  Service: Cardiovascular;  Laterality: N/A;  . COLONOSCOPY    . LUMBAR FUSION    . TOOTH EXTRACTION      There were no vitals filed for this visit.  Subjective Assessment - 09/29/17 1340    Subjective  Patient reports that he was having some increased knee pain following pool therapy. When asked to clarify if this is pain or muscle fatigue patient reports "it may be soreness". Patient reports still having some "tightness" in bilat knees, but that he was impressed that he was  able to walk about .5 a mile in [place] yesterday without significant pain.     Patient is accompained by:  Family member    Pertinent History  Patient is a pleasant 67 y/o male presenting s/p 1/30 posterior lumbar fusion (L4-5) following longstanding spondylolysis and stenosis. Patient ambulates w/ SPC and lumbar brace in the community, reports he does not use them at home. Patient reports that prior to sx he had numbness and tingling down both LE for years, that have now subsided post surgically. Patient reports no pain in the low back today and 0/10 pain throughout the past week. Patient is only taking tylenol at this time. Patient is wearing a lumbar brace that he reports wearing when he goes out in the community. Patient reports he is currently on lifting restrictions >5lbs and excessive ROM for unspecified time (patient is under the impression this is open to PT). Pt reports post surgically his knees have been increasingly bothering him after longstanding chronic pain, and that with prolonged sitting he feels as though he has to have his cane in order to stand up and walk safely. Patient reports that he feels unsteady walking, but that has become better since his sensation has increased post surgicall, but he feels as though his LEs will "give out".     Limitations  Standing  How long can you sit comfortably?  No limitations    How long can you stand comfortably?  10 minutes    Diagnostic tests  See MRI report, L4-L5 fusion    Patient Stated Goals  Be able to stand and walk for longer durations of time- would like to be able to do so w/o AD; would like to be able to perform in play this Summer         Ther-Ex -Nustep 6 min on L5 for warmup with increased aerobic activity  -Circuit of bilat hip abd, bilat hip ext, and heel raises 15x each exercise before standing rest; cuing for eccentric control. Patient was able to take standing rest between 1st and 2nd set and seated between 2nd and 3rd  set -Step ups 6" step 3x 10 leading with L (utilizing handrail support) 3x 10 leading with R (utilizing min handrail support)- Seated break between 1st and 2nd set; and standing between 2nd and 3rd -Step + military press walk 21f (down and back) x2 w/ bilat 4# wt for dynamic balance, core engagement, and aerobic endurance BP taken 4 min following session on R UE in seated position: 131/80                        PT Education - 09/29/17 1348    Education provided  Yes    Education Details  Exercise form    Person(s) Educated  Patient    Methods  Explanation;Verbal cues;Demonstration    Comprehension  Verbalized understanding;Returned demonstration;Verbal cues required       PT Short Term Goals - 07/19/17 1524      PT SHORT TERM GOAL #1   Title  Pt will be independent with HEP in order to improve strength and balance in order to decrease fall risk and improve function at home and work.    Time  2    Period  Weeks    Status  New        PT Long Term Goals - 09/09/17 04431     PT LONG TERM GOAL #1   Title  Pt will be able to comfortably stand unsupported without N/T for 20 minutes to complete ADLs    Baseline  4/19 Able to stand for 15 minutes without unsteadiness or pain, only min fatigue    Time  8    Status  Partially Met      PT LONG TERM GOAL #2   Title  Pt will decrease 5TSTS by at least 3 seconds in order to demonstrate clinically significant improvement in LE strength    Baseline  2/26: 13.28sec w/o UE support    Time  8    Period  Weeks    Status  Achieved      PT LONG TERM GOAL #3   Title  Pt will increase 6MWT by at least [id number] order to demonstrate clinically significant improvement in cardiopulmonary endurance and community ambulation    Baseline  4/19: [id number] AD    Time  6    Status  Partially Met      PT LONG TERM GOAL #4   Title  Patient will increase gross hip strength to 4/5 bilat in order to complete safe squatting and  stooping    Baseline  2/26: hip flexion: 4+/5 bilat; hip ext: 4/5 bilat; Hip IR 4+/5 bilat; Hip ER 4+/5 bilat; Hip abd 4+/5 bilat    Time  8  Period  Weeks    Status  Achieved            Plan - 09/29/17 1404    Clinical Impression Statement  PT led patient through therex, requiring increased aerobic demand, which patient was able to complete with no increased pain, only noted muscle fatigue. With PT encouragement, patient was able to complete therex with less seated rest this session. Pt was advised to let PT know next visit if he will be joining a gym or would like to continue exercises at home for d/c planning, as patient is getting close to maintaining aerobic exercise at home on his own, as he is needing less cuing and guarding for safety and proper exercise form.     Rehab Potential  Fair    Clinical Impairments Affecting Rehab Potential  Negative- medical comorbidities     PT Frequency  2x / week    PT Duration  8 weeks    PT Treatment/Interventions  Aquatic Therapy;Gait training;Stair training;Therapeutic activities;Therapeutic exercise;Balance training;Manual techniques;Energy conservation;Functional mobility training;Moist Heat;Cryotherapy;ADLs/Self Care Home Management;Neuromuscular re-education;Passive range of motion;Taping;Patient/family education    PT Next Visit Plan  Strengthening with endurance focus    PT Home Exercise Plan  Mini squat, standing hip abd and flex, and standing heel raises at rep range of 15-20reps for endurance; aerobic walking program    Consulted and Agree with Plan of Care  Patient       Patient will benefit from skilled therapeutic intervention in order to improve the following deficits and impairments:  Abnormal gait, Decreased balance, Decreased activity tolerance, Decreased endurance, Difficulty walking, Impaired sensation, Improper body mechanics, Decreased mobility, Decreased strength, Impaired flexibility  Visit Diagnosis: Muscle weakness  (generalized)     Problem List Patient Active Problem List   Diagnosis Date Noted  . Spondylolisthesis of lumbar region 06/22/2017  . Visit for monitoring Tikosyn therapy 03/29/2017  . Hip pain 05/24/2015   Shelton Silvas PT, DPT Shelton Silvas 09/29/2017, 2:39 PM  [place] [facility] [address], [place], [address] Phone: [phone]   Fax:  [phone]  Name: DONATHAN BULLER MRN: [id number] Date of Birth: 08-08-1950

## 2017-10-04 ENCOUNTER — Other Ambulatory Visit: Payer: Self-pay

## 2017-10-04 ENCOUNTER — Ambulatory Visit: Payer: Medicare Other

## 2017-10-04 DIAGNOSIS — M6281 Muscle weakness (generalized): Secondary | ICD-10-CM | POA: Diagnosis not present

## 2017-10-04 DIAGNOSIS — M5416 Radiculopathy, lumbar region: Secondary | ICD-10-CM

## 2017-10-04 DIAGNOSIS — R262 Difficulty in walking, not elsewhere classified: Secondary | ICD-10-CM

## 2017-10-04 NOTE — Therapy (Signed)
[place] [facility] [address] [place], [place], [address] Phone: [phone]   Fax:  [phone]  Physical Therapy Treatment  Patient Details  Name: Luke Morgan MRN: [id number] Date of Birth: [birth year] Referring Provider: Arnoldo Morale   Encounter Date: 10/04/2017  PT End of Session - 10/04/17 1418    Visit Number  22    Number of Visits  25    Date for PT Re-Evaluation  10/11/17    PT Start Time  1607    PT Stop Time  1130    PT Time Calculation (min)  55 min    Activity Tolerance  Patient tolerated treatment well    Behavior During Therapy  [facility] for tasks assessed/performed       Past Medical History:  Diagnosis Date  . Asthma   . Atrial fibrillation ([place])   . History of kidney stones   . Hypertension   . Kidney stones   . Obesity   . Osteoarthritis   . Pre-diabetes    patient is taking metformin  . Sleep apnea   . Spondylolisthesis of lumbar region     Past Surgical History:  Procedure Laterality Date  . CARDIOVERSION N/A 12/24/2016   Procedure: CARDIOVERSION;  Surgeon: Minna Merritts, MD;  Location: [place] [facility];  Service: Cardiovascular;  Laterality: N/A;  . CARDIOVERSION N/A 01/04/2017   Procedure: CARDIOVERSION;  Surgeon: Deboraha Sprang, MD;  Location: [facility] ENDOSCOPY;  Service: Cardiovascular;  Laterality: N/A;  . CARDIOVERSION N/A 03/31/2017   Procedure: CARDIOVERSION;  Surgeon: Larey Dresser, MD;  Location: [facility] ENDOSCOPY;  Service: Cardiovascular;  Laterality: N/A;  . COLONOSCOPY    . LUMBAR FUSION    . TOOTH EXTRACTION      There were no vitals filed for this visit.  Subjective Assessment - 10/04/17 1415    Subjective  Pt reports usual back and knee pain this morning with back pain alleviated to tolerable level with usual hot shower. L knee pain is 2/10    Patient is accompained by:  Family member    Pertinent History  Patient is a pleasant 67 y/o male presenting s/p 1/30 posterior lumbar fusion  (L4-5) following longstanding spondylolysis and stenosis. Patient ambulates w/ SPC and lumbar brace in the community, reports he does not use them at home. Patient reports that prior to sx he had numbness and tingling down both LE for years, that have now subsided post surgically. Patient reports no pain in the low back today and 0/10 pain throughout the past week. Patient is only taking tylenol at this time. Patient is wearing a lumbar brace that he reports wearing when he goes out in the community. Patient reports he is currently on lifting restrictions >5lbs and excessive ROM for unspecified time (patient is under the impression this is open to PT). Pt reports post surgically his knees have been increasingly bothering him after longstanding chronic pain, and that with prolonged sitting he feels as though he has to have his cane in order to stand up and walk safely. Patient reports that he feels unsteady walking, but that has become better since his sensation has increased post surgicall, but he feels as though his LEs will "give out".       Enters exits via ramp   Ambulation  Fwd, 4 L   Side, 4 L  Suspended work, 75 sec each (5 min total)  Jog  Barnabas Lister  Ski   X jack  1 L active  recovery  Core with UE work (progression of sets today)  Blue dumbbells, 3 x 10 ea   Ticeps ext   Sh abd/add   Sh flex/ext, sh's mildly out of water  Green dumbbells, 3 x 10   Horiz abd/add  Suspended work, as above (5 min)  1 L active recovery  Core  Stand with L knee tuck and ball to knee, 30x  2 L fwd fall to recover  2 L side fall to recover, B  Suspended work as above except 60 sec each, (4 min)  1 L active recovery  Side step with ball toss, 4 L                           PT Education - 10/04/17 1416    Education provided  Yes    Education Details  Continued with core stabilization, interval training with circuits of strength and cardio with difference of performance of  each using properties of water. Breathing with exercise. Proper techniques throughout. Use of ice for pain control/alleviation/prevention with knee.     Person(s) Educated  Patient    Methods  Explanation;Demonstration    Comprehension  Verbalized understanding;Verbal cues required       PT Short Term Goals - 07/19/17 1524      PT SHORT TERM GOAL #1   Title  Pt will be independent with HEP in order to improve strength and balance in order to decrease fall risk and improve function at home and work.    Time  2    Period  Weeks    Status  New        PT Long Term Goals - 09/09/17 2257      PT LONG TERM GOAL #1   Title  Pt will be able to comfortably stand unsupported without N/T for 20 minutes to complete ADLs    Baseline  4/19 Able to stand for 15 minutes without unsteadiness or pain, only min fatigue    Time  8    Status  Partially Met      PT LONG TERM GOAL #2   Title  Pt will decrease 5TSTS by at least 3 seconds in order to demonstrate clinically significant improvement in LE strength    Baseline  2/26: 13.28sec w/o UE support    Time  8    Period  Weeks    Status  Achieved      PT LONG TERM GOAL #3   Title  Pt will increase 6MWT by at least [id number] in order to demonstrate clinically significant improvement in cardiopulmonary endurance and community ambulation    Baseline  4/19: [id number] AD    Time  6    Status  Partially Met      PT LONG TERM GOAL #4   Title  Patient will increase gross hip strength to 4/5 bilat in order to complete safe squatting and stooping    Baseline  2/26: hip flexion: 4+/5 bilat; hip ext: 4/5 bilat; Hip IR 4+/5 bilat; Hip ER 4+/5 bilat; Hip abd 4+/5 bilat    Time  8    Period  Weeks    Status  Achieved            Plan - 10/04/17 1419    Clinical Impression Statement  Pt continues to require cues for proper techniques with exercises, breathing with exercise as well as relaxing muscles not directly involved with exercises as well  as  increasing work effort with cardio portions for maximum benefit. Continue to improve all of these areas to work toward independence with program and concepts.     Rehab Potential  Fair    Clinical Impairments Affecting Rehab Potential  Negative- medical comorbidities     PT Frequency  2x / week    PT Duration  8 weeks    PT Treatment/Interventions  Aquatic Therapy;Gait training;Stair training;Therapeutic activities;Therapeutic exercise;Balance training;Manual techniques;Energy conservation;Functional mobility training;Moist Heat;Cryotherapy;ADLs/Self Care Home Management;Neuromuscular re-education;Passive range of motion;Taping;Patient/family education    PT Next Visit Plan  Strengthening with endurance focus    PT Home Exercise Plan  Mini squat, standing hip abd and flex, and standing heel raises at rep range of 15-20reps for endurance; aerobic walking program    Consulted and Agree with Plan of Care  Patient       Patient will benefit from skilled therapeutic intervention in order to improve the following deficits and impairments:  Abnormal gait, Decreased balance, Decreased activity tolerance, Decreased endurance, Difficulty walking, Impaired sensation, Improper body mechanics, Decreased mobility, Decreased strength, Impaired flexibility  Visit Diagnosis: Muscle weakness (generalized)  Difficulty in walking, not elsewhere classified  Radiculopathy, lumbar region     Problem List Patient Active Problem List   Diagnosis Date Noted  . Spondylolisthesis of lumbar region 06/22/2017  . Visit for monitoring Tikosyn therapy 03/29/2017  . Hip pain 05/24/2015    Larae Grooms 10/04/2017, 2:23 PM  [place] [facility] [address] [place], [place], [address] Phone: [phone]   Fax:  [phone]  Name: Luke Morgan MRN: [id number] Date of Birth: 10-27-1950

## 2017-10-06 ENCOUNTER — Encounter: Payer: Medicare Other | Admitting: Physical Therapy

## 2017-10-07 ENCOUNTER — Ambulatory Visit: Payer: Medicare Other | Admitting: Physical Therapy

## 2017-10-07 ENCOUNTER — Encounter: Payer: Self-pay | Admitting: Physical Therapy

## 2017-10-07 DIAGNOSIS — M6281 Muscle weakness (generalized): Secondary | ICD-10-CM

## 2017-10-07 NOTE — Therapy (Signed)
[facility] [address] [place], [address] Phone: [phone]   Fax:  [phone]  Physical Therapy Treatment  Patient Details  Name: Luke Morgan MRN: [id number] Date of Birth: [birth year] Referring Provider: Arnoldo Morale   Encounter Date: 10/07/2017  PT End of Session - 10/07/17 0939    Visit Number  23    Number of Visits  25    Date for PT Re-Evaluation  10/11/17    PT Start Time  0945    PT Stop Time  1030    PT Time Calculation (min)  45 min    Activity Tolerance  Patient tolerated treatment well    Behavior During Therapy  [facility] for tasks assessed/performed       Past Medical History:  Diagnosis Date  . Asthma   . Atrial fibrillation ([place])   . History of kidney stones   . Hypertension   . Kidney stones   . Obesity   . Osteoarthritis   . Pre-diabetes    patient is taking metformin  . Sleep apnea   . Spondylolisthesis of lumbar region     Past Surgical History:  Procedure Laterality Date  . CARDIOVERSION N/A 12/24/2016   Procedure: CARDIOVERSION;  Surgeon: Minna Merritts, MD;  Location: [facility];  Service: Cardiovascular;  Laterality: N/A;  . CARDIOVERSION N/A 01/04/2017   Procedure: CARDIOVERSION;  Surgeon: Deboraha Sprang, MD;  Location: [facility] END[facility]COPY;  Service: Cardiovascular;  Laterality: N/A;  . CARDIOVERSION N/A 03/31/2017   Procedure: CARDIOVERSION;  Surgeon: Larey Dresser, MD;  Location: [facility] ENDOSCOPY;  Service: Cardiovascular;  Laterality: N/A;  . COLONOSCOPY    . LUMBAR FUSION    . TOOTH EXTRACTION      There were no vitals filed for this visit.  Subjective Assessment - 10/07/17 0926    Subjective  Patient reports he is having some pain in bilat knees when he gets up in the morning that subsides. Patient reports he would like to d/c to HEP as discussed last session, but that he may become a member of [employer] later this summer. Patient reports he is frustrated with  not being able to lose weight for his knee surgery. PT asked patient if he has completed increased activity or his HEP which he continues to report non-compliance with.     Patient is accompained by:  Family member    Pertinent History  Patient is a pleasant 67 y/o male presenting s/p 1/30 posterior lumbar fusion (L4-5) following longstanding spondylolysis and stenosis. Patient ambulates w/ SPC and lumbar brace in the community, reports he does not use them at home. Patient reports that prior to sx he had numbness and tingling down both LE for years, that have now subsided post surgically. Patient reports no pain in the low back today and 0/10 pain throughout the past week. Patient is only taking tylenol at this time. Patient is wearing a lumbar brace that he reports wearing when he goes out in the community. Patient reports he is currently on lifting restrictions >5lbs and excessive ROM for unspecified time (patient is under the impression this is open to PT). Pt reports post surgically his knees have been increasingly bothering him after longstanding chronic pain, and that with prolonged sitting he feels as though he has to have his cane in order to stand up and walk safely. Patient reports that he feels unsteady walking, but that has become better since his sensation has increased  post surgicall, but he feels as though his LEs will "give out".     Limitations  Standing    How long can you sit comfortably?  No limitations    How long can you stand comfortably?  10 minutes    Diagnostic tests  See MRI report, L4-L5 fusion    Patient Stated Goals  Be able to stand and walk for longer durations of time- would like to be able to do so w/o AD; would like to be able to perform in play this Summer       Ther-Ex -Nustep 5 min  -Mini Squat with red tband at knees for hip abd activation 2x 15 -Hip abd with red tband 2x 15 with min cuing for proper posture and eccentric control -Hip ext with red tband 2x 15  with min cuing for eccentric control  -Heel raises 2x 15 with cuing for eccentric control -Step Ups 2x 12 alternating L and R LE leading -Lateral band walks red tband 7f (12 steps down, 12 steps back)  PT developed HEP of all of above exercises + bridge, sidelying hip abd, and prone hip ext, going over all exercises and set/rep range for endurance 3-6sets 12-20reps with 30-45sec between sets; and as he seeks gym membership he wants to increase weight on machines for fatigue at a set/rep range of 3-5 sets 8-12reps with 60-90sec between sets for muscular strength which is important to decrease weight loss (as this is patient's goal for surgery) as increased muscle mass increases metabolism. PT advised patient to attempt 368m of aerobic activity 4x/week and gave examples of aerobic activity. PT gave patient therabands for home use with education on how and when to increase theraband resistance. PT gave patient PT email should patient have any questions or concerns, and for future endeavors should he choose to attend a gym                       PT Education - 10/07/17 0930    Education provided  Yes    Education Details  D/C recommendations; exercise regimen for wt loss/cardiovascular health    Person(s) Educated  Patient    Methods  Explanation;Demonstration;Tactile cues;Verbal cues    Comprehension  Verbal cues required;Returned demonstration;Verbalized understanding;Tactile cues required       PT Short Term Goals - 07/19/17 1524      PT SHORT TERM GOAL #1   Title  Pt will be independent with HEP in order to improve strength and balance in order to decrease fall risk and improve function at home and work.    Time  2    Period  Weeks    Status  New        PT Long Term Goals - 09/09/17 092130    PT LONG TERM GOAL #1   Title  Pt will be able to comfortably stand unsupported without N/T for 20 minutes to complete ADLs    Baseline  4/19 Able to stand for 15 minutes  without unsteadiness or pain, only min fatigue    Time  8    Status  Partially Met      PT LONG TERM GOAL #2   Title  Pt will decrease 5TSTS by at least 3 seconds in order to demonstrate clinically significant improvement in LE strength    Baseline  2/26: 13.28sec w/o UE support    Time  8    Period  Weeks    Status  Achieved      PT LONG TERM GOAL #3   Title  Pt will increase 6MWT by at least 64m(1674f in order to demonstrate clinically significant improvement in cardiopulmonary endurance and community ambulation    Baseline  4/19: 107383f/o AD    Time  6    Status  Partially Met      PT LONG TERM GOAL #4   Title  Patient will increase gross hip strength to 4/5 bilat in order to complete safe squatting and stooping    Baseline  2/26: hip flexion: 4+/5 bilat; hip ext: 4/5 bilat; Hip IR 4+/5 bilat; Hip ER 4+/5 bilat; Hip abd 4+/5 bilat    Time  8    Period  Weeks    Status  Achieved            Plan - 10/07/17 1057    Clinical Impression Statement  Pt is only having some pain in bilat knees in the morning at this time, that he reports subsides quickly after a hot shower. Patient's pain in bilat knees is attributed to degenerative changes and he is seeking TKA for this. Patient reports his biggest goals at this time are weight loss related, as he has to lose 40lbs before being a surgical candidate. Because patient is having no further movement or pain deficits that are eligable for skilled PT services, PT discharged patient to a robust HEP aimmed at accomplishing his weight loss, as well as cardiovascular fitness goals. Patient was reluctant to d/c PT at first, but was encouraged by PT that his goals at this time would be better suited for personal training/nutritionist. Pt reports that he enjoys coming to PT, and patient has not reported compliance with HEP reccommendations from PT at this time. PT spent time educating pt on PT's role in the health care continuum, acknowledging that  we do aim to encourage our patients, but this cannot be our sole role to justify PT services. Patient eventually agreed with PT, and was encouraged to know that he could reach out via email and return should he have any status change. Pt thanked PT for their support this far, and agreed to be more deligent with HEP in the future.     Rehab Potential  Fair    Clinical Impairments Affecting Rehab Potential  Negative- medical comorbidities     PT Frequency  2x / week    PT Duration  8 weeks    PT Treatment/Interventions  Aquatic Therapy;Gait training;Stair training;Therapeutic activities;Therapeutic exercise;Balance training;Manual techniques;Energy conservation;Functional mobility training;Moist Heat;Cryotherapy;ADLs/Self Care Home Management;Neuromuscular re-education;Passive range of motion;Taping;Patient/family education    PT Next Visit Plan  Strengthening with endurance focus    PT Home Exercise Plan  Mini squat, standing hip abd and flex, and standing heel raises at rep range of 15-20reps for endurance; aerobic walking program    Consulted and Agree with Plan of Care  Patient       Patient will benefit from skilled therapeutic intervention in order to improve the following deficits and impairments:  Abnormal gait, Decreased balance, Decreased activity tolerance, Decreased endurance, Difficulty walking, Impaired sensation, Improper body mechanics, Decreased mobility, Decreased strength, Impaired flexibility  Visit Diagnosis: Muscle weakness (generalized)     Problem List Patient Active Problem List   Diagnosis Date Noted  . Spondylolisthesis of lumbar region 06/22/2017  . Visit for monitoring Tikosyn therapy 03/29/2017  . Hip pain 05/24/2015   CheShelton Silvas, DPT CheShelton Silvas18/2019, 12:33 PM  [place]  [facility] [address] [place], [address] Phone: [phone]   Fax:  [phone]  Name: Luke Morgan MRN: [id number] Date of Birth: December 30, 1950

## 2017-10-11 ENCOUNTER — Other Ambulatory Visit: Payer: Self-pay

## 2017-10-11 ENCOUNTER — Ambulatory Visit: Payer: Medicare Other

## 2017-10-11 DIAGNOSIS — M6281 Muscle weakness (generalized): Secondary | ICD-10-CM | POA: Diagnosis not present

## 2017-10-11 DIAGNOSIS — R262 Difficulty in walking, not elsewhere classified: Secondary | ICD-10-CM

## 2017-10-11 DIAGNOSIS — M5416 Radiculopathy, lumbar region: Secondary | ICD-10-CM

## 2017-10-11 NOTE — Therapy (Addendum)
[place] MAIN [facility] SERVICES [address] [place], [place], [address] Phone: [phone]   Fax:  [phone]  Physical Therapy Treatment  Patient Details  Name: Luke Morgan MRN: [id number] Date of Birth: [birth year] Referring Provider: Arnoldo Morale   Encounter Date: 10/11/2017  PT End of Session - 10/11/17 1607    Visit Number  24    Number of Visits  25    Date for PT Re-Evaluation  10/11/17    PT Start Time  0945    PT Stop Time  1030    PT Time Calculation (min)  45 min    Activity Tolerance  Patient tolerated treatment well    Behavior During Therapy  [facility] for tasks assessed/performed       Past Medical History:  Diagnosis Date  . Asthma   . Atrial fibrillation ([place])   . History of kidney stones   . Hypertension   . Kidney stones   . Obesity   . Osteoarthritis   . Pre-diabetes    patient is taking metformin  . Sleep apnea   . Spondylolisthesis of lumbar region     Past Surgical History:  Procedure Laterality Date  . CARDIOVERSION N/A 12/24/2016   Procedure: CARDIOVERSION;  Surgeon: Minna Merritts, MD;  Location: [facility];  Service: Cardiovascular;  Laterality: N/A;  . CARDIOVERSION N/A 01/04/2017   Procedure: CARDIOVERSION;  Surgeon: Deboraha Sprang, MD;  Location: [facility] ENDOSCOPY;  Service: Cardiovascular;  Laterality: N/A;  . CARDIOVERSION N/A 03/31/2017   Procedure: CARDIOVERSION;  Surgeon: Larey Dresser, MD;  Location: [facility] ENDOSCOPY;  Service: Cardiovascular;  Laterality: N/A;  . COLONOSCOPY    . LUMBAR FUSION    . TOOTH EXTRACTION      There were no vitals filed for this visit.  Subjective Assessment - 10/11/17 1600    Subjective  Pt reports 2/10 pain today in LB and L knee. Pt notes he wishes to finish with land therapy; questioned pt's intention regarding aquatic therapy. Pt states he would continue if more visits approved. Pt in agreement that pt is not independent in aquatic exercise at this time to  successfully continue independently. Will contact primary therapist regarding additional visits for aquatics with plan to discharge after those visits; otherwise will supply pt with aquatic HEP. Pt does note that he does not have a pool to go to at this time.     Patient is accompained by:  Family member    Pertinent History  Patient is a pleasant 67 y/o male presenting s/p 1/30 posterior lumbar fusion (L4-5) following longstanding spondylolysis and stenosis. Patient ambulates w/ SPC and lumbar brace in the community, reports he does not use them at home. Patient reports that prior to sx he had numbness and tingling down both LE for years, that have now subsided post surgically. Patient reports no pain in the low back today and 0/10 pain throughout the past week. Patient is only taking tylenol at this time. Patient is wearing a lumbar brace that he reports wearing when he goes out in the community. Patient reports he is currently on lifting restrictions >5lbs and excessive ROM for unspecified time (patient is under the impression this is open to PT). Pt reports post surgically his knees have been increasingly bothering him after longstanding chronic pain, and that with prolonged sitting he feels as though he has to have his cane in order to stand up and walk safely. Patient reports that he feels unsteady walking,  but that has become better since his sensation has increased post surgicall, but he feels as though his LEs will "give out".       Ambulation  4 L fwd  2 L side   2 L side with squat  Suspended work, 5 min (75 sec each), no rest between  [employer]  Ski  X jack  1 L active recovery  Core with UE strength, 3 x 10 each, blue dumbbells  Triceps press dowsn  Sh abd/add  Sh flex/ext  Sh horiz abd/add (dumbells horizontal)  5 min suspended work as above   2 L active recovery  Stand core with ball overhead to knee tuck, 2 x 10 B  Fall to recover, 2 L ea  Fwd  Side, B  SL toe touch  other with ball toss, 1 min each Feet together, ball toss in/out of range, 1 min  Independent increased speed walking fwd/side for additional 10 min                       PT Education - 10/11/17 1605    Education provided  Yes       PT Short Term Goals - 07/19/17 1524      PT SHORT TERM GOAL #1   Title  Pt will be independent with HEP in order to improve strength and balance in order to decrease fall risk and improve function at home and work.    Time  2    Period  Weeks    Status  New        PT Long Term Goals - 09/09/17 7062      PT LONG TERM GOAL #1   Title  Pt will be able to comfortably stand unsupported without N/T for 20 minutes to complete ADLs    Baseline  4/19 Able to stand for 15 minutes without unsteadiness or pain, only min fatigue    Time  8    Status  Partially Met      PT LONG TERM GOAL #2   Title  Pt will decrease 5TSTS by at least 3 seconds in order to demonstrate clinically significant improvement in LE strength    Baseline  2/26: 13.28sec w/o UE support    Time  8    Period  Weeks    Status  Achieved      PT LONG TERM GOAL #3   Title  Pt will increase 6MWT by at least 20m(169f in order to demonstrate clinically significant improvement in cardiopulmonary endurance and community ambulation    Baseline  4/19: 107385f/o AD    Time  6    Status  Partially Met      PT LONG TERM GOAL #4   Title  Patient will increase gross hip strength to 4/5 bilat in order to complete safe squatting and stooping    Baseline  2/26: hip flexion: 4+/5 bilat; hip ext: 4/5 bilat; Hip IR 4+/5 bilat; Hip ER 4+/5 bilat; Hip abd 4+/5 bilat    Time  8    Period  Weeks    Status  Achieved            Plan - 10/11/17 1608    Clinical Impression Statement  Pt notes some brief pain in back post core work with UE strengthening using blue dumbbells. Re educated pt again on judging appropriate level of work and importance of proper form versus pt's  interpretation of working harder.  It is noted at times pt tends to protrude abdominal muscles with valsalva type bearing down and overt exhalation despite cues. Pt given back stretch from rail with eases discomfort. Pt continues to requires constant cueing throughout session for technique, breath, posture, and appropriate speed levels for exercises. Would like to see pt continue with aquatic for an additional 8 visits if pt agreeable to improve, so that pt can sustain an independent program effectively and safely. Had pt work independently for 10 additional minutes on higher speed water walking fwd and side.     Rehab Potential  Fair    Clinical Impairments Affecting Rehab Potential  Negative- medical comorbidities     PT Frequency  2x / week    PT Duration  8 weeks    PT Treatment/Interventions  Aquatic Therapy;Gait training;Stair training;Therapeutic activities;Therapeutic exercise;Balance training;Manual techniques;Energy conservation;Functional mobility training;Moist Heat;Cryotherapy;ADLs/Self Care Home Management;Neuromuscular re-education;Passive range of motion;Taping;Patient/family education    PT Next Visit Plan  Strengthening with endurance focus    PT Home Exercise Plan  Mini squat, standing hip abd and flex, and standing heel raises at rep range of 15-20reps for endurance; aerobic walking program    Consulted and Agree with Plan of Care  Patient       Patient will benefit from skilled therapeutic intervention in order to improve the following deficits and impairments:  Abnormal gait, Decreased balance, Decreased activity tolerance, Decreased endurance, Difficulty walking, Impaired sensation, Improper body mechanics, Decreased mobility, Decreased strength, Impaired flexibility  Visit Diagnosis: Muscle weakness (generalized)  Difficulty in walking, not elsewhere classified  Radiculopathy, lumbar region     Problem List Patient Active Problem List   Diagnosis Date Noted  .  Spondylolisthesis of lumbar region 06/22/2017  . Visit for monitoring Tikosyn therapy 03/29/2017  . Hip pain 05/24/2015    Larae Grooms 10/11/2017, 4:17 PM  [place] MAIN [facility] SERVICES [address] [place], [place], [address] Phone: [phone]   Fax:  [phone]  Name: Luke Morgan MRN: [id number] Date of Birth: 1951-03-26

## 2017-10-13 ENCOUNTER — Encounter: Payer: Medicare Other | Admitting: Physical Therapy

## 2017-10-14 ENCOUNTER — Ambulatory Visit: Payer: Medicare Other | Admitting: Physical Therapy

## 2017-10-18 ENCOUNTER — Ambulatory Visit: Payer: Medicare Other

## 2017-10-20 ENCOUNTER — Encounter: Payer: Medicare Other | Admitting: Physical Therapy

## 2017-10-21 ENCOUNTER — Other Ambulatory Visit: Payer: Self-pay | Admitting: Internal Medicine

## 2017-10-21 ENCOUNTER — Encounter: Payer: Medicare Other | Admitting: Physical Therapy

## 2018-01-05 ENCOUNTER — Ambulatory Visit (INDEPENDENT_AMBULATORY_CARE_PROVIDER_SITE_OTHER): Payer: Medicare Other | Admitting: Internal Medicine

## 2018-01-05 ENCOUNTER — Encounter: Payer: Self-pay | Admitting: Internal Medicine

## 2018-01-05 VITALS — BP 131/87 | HR 81 | Ht 77.0 in | Wt 380.0 lb

## 2018-01-05 DIAGNOSIS — I509 Heart failure, unspecified: Secondary | ICD-10-CM | POA: Diagnosis not present

## 2018-01-05 DIAGNOSIS — I4819 Other persistent atrial fibrillation: Secondary | ICD-10-CM

## 2018-01-05 DIAGNOSIS — I481 Persistent atrial fibrillation: Secondary | ICD-10-CM

## 2018-01-05 DIAGNOSIS — I428 Other cardiomyopathies: Secondary | ICD-10-CM

## 2018-01-05 DIAGNOSIS — Z79899 Other long term (current) drug therapy: Secondary | ICD-10-CM

## 2018-01-05 MED ORDER — FUROSEMIDE 40 MG PO TABS: 40.0000 mg | ORAL_TABLET | ORAL | 3 refills | Status: DC

## 2018-01-05 MED ORDER — POTASSIUM CHLORIDE ER 10 MEQ PO TBCR: 10.0000 meq | EXTENDED_RELEASE_TABLET | ORAL | 3 refills | Status: DC

## 2018-01-05 NOTE — Progress Notes (Signed)
Patient Care Team: Barbette Reichmann, MD as PCP - General (Internal Medicine)   HPI  Luke Morgan is a 67 y.o. male Seen with a chief complaint of atrial fibrillation with initial failure to cardiovert.  He was treated withpropafenone  and dofetilide.  No changes were noted with sinus rhythm because of recurrent failure of antiarrhythmic therapy was abandoned with a strategy of rate control.  He is treated with apixaban and is without bleeding issues   DATE TEST EF   Sum 2018 Echo   43 %            He is using his phone for sporadic heart rate measurements.   Heart rates at home have mostly been in the 60s-90s.  Weight loss is stable   Arthritis limits activity.  The patient has chronic shortness of breath.  Mild peripheral edema.  No chest pain.  These are unchanged.    Date Cr K Hgb  7/18  0.88 4.0 15.5  11/18  1.02 3.5 14.2  2/19 0.9 3.9 13.5  7/19 1.0 4.5 15    Thromboembolic risk factors ( age -38 , HTN-1,) for a CHADSVASc Score of  2 Past Medical History:  Diagnosis Date  . Asthma   . Atrial fibrillation ([facility])   . History of kidney stones   . Hypertension   . Kidney stones   . Obesity   . Osteoarthritis   . Pre-diabetes    patient is taking metformin  . Sleep apnea   . Spondylolisthesis of lumbar region     Past Surgical History:  Procedure Laterality Date  . CARDIOVERSION N/A 12/24/2016   Procedure: CARDIOVERSION;  Surgeon: Antonieta Iba, MD;  Location: [facility];  Service: Cardiovascular;  Laterality: N/A;  . CARDIOVERSION N/A 01/04/2017   Procedure: CARDIOVERSION;  Surgeon: Duke Salvia, MD;  Location: [facility] ENDOSCOPY;  Service: Cardiovascular;  Laterality: N/A;  . CARDIOVERSION N/A 03/31/2017   Procedure: CARDIOVERSION;  Surgeon: Laurey Morale, MD;  Location: [facility] ENDOSCOPY;  Service: Cardiovascular;  Laterality: N/A;  . COLONOSCOPY    . LUMBAR FUSION    . TOOTH EXTRACTION      Current Outpatient Medications  Medication Sig  Dispense Refill  . apixaban (ELIQUIS) 5 MG TABS tablet Take 5 mg by mouth 2 (two) times daily.    . Fluticasone-Salmeterol (ADVAIR) 100-50 MCG/DOSE AEPB Inhale 1 puff into the lungs 2 (two) times daily as needed (shortness of breath).     . gabapentin (NEURONTIN) 300 MG capsule Take 300 mg by mouth 3 (three) times daily.    . hydrALAZINE (APRESOLINE) 50 MG tablet Take 50 mg by mouth 3 (three) times daily.    Marland Kitchen losartan (COZAAR) 100 MG tablet Take 100 mg by mouth daily.    . metFORMIN (GLUCOPHAGE) 500 MG tablet Take 500 mg daily with breakfast by mouth.     . metoprolol succinate (TOPROL-XL) 100 MG 24 hr tablet Take 100 mg by mouth daily. Take with or immediately following a meal.    . Multiple Vitamins-Minerals (MULTIVITAMIN WITH MINERALS) tablet Take 1 tablet by mouth daily.    . potassium chloride (K-DUR) 10 MEQ tablet Take 1 tablet (10 mEq total) every other day by mouth. On days you take furosemide 15 tablet 6  . spironolactone (ALDACTONE) 25 MG tablet Take 1 tablet (25 mg total) daily by mouth. 30 tablet 11  . furosemide (LASIX) 40 MG tablet Take 1 tablet (40 mg total) every other day  by mouth. 15 tablet 6   No current facility-administered medications for this visit.     Allergies  Allergen Reactions  . Penicillins     UNSPECIFIED REACTION   Has patient had a PCN reaction causing immediate rash, facial/tongue/throat swelling, SOB or lightheadedness with hypotension: Unknown Has patient had a PCN reaction causing severe rash involving mucus membranes or skin necrosis: Unknown Has patient had a PCN reaction that required hospitalization No Has patient had a PCN reaction occurring within the last 10 years: No If all of the above answers are "NO", then may proceed with Cephalosporin use.      Review of Systems negative except from HPI and PMH  Physical Exam BP 131/87 (BP Location: Left Arm, Patient Position: Sitting, Cuff Size: Large)   Pulse 81   Ht 6\' 5"  (1.956 m)   Wt (!) 380  lb (172.4 kg)   BMI 45.06 kg/m  Well developed and Morbidly obese  in no acute distress HENT normal Neck supple with JVP-flat Clear Regular rate and rhythm, no murmurs or gallops Abd-soft with active BS No Clubbing cyanosis 1+edema Skin-warm and dry A & Oriented  Grossly normal sensory and motor function  ECG: Atrial fibrillation 81 Interval-/07/37  Assessment and  Plan  Atrial fibrillation-permanent  Congestive heart failure   HFpEF  Morbid obesity   Hypertension   Cardiomyopathy-nonischemic presumably   High Risk Medication Surveillance  Labs were reviewed and are within acceptable limits  Patient volume status is stable.  I encouraged him with exercise and to consider water aerobics.  This will help with any weight loss gain.  He is interested in following up with weight loss programs in [place].  He remains active in his church and theater and watching baseball  On Anticoagulation;  No bleeding issues

## 2018-01-05 NOTE — Patient Instructions (Signed)

## 2018-01-26 ENCOUNTER — Telehealth: Payer: Self-pay | Admitting: Internal Medicine

## 2018-01-26 NOTE — Telephone Encounter (Signed)
°*  STAT* If patient is at the pharmacy, call can be transferred to refill team.   1. Which medications need to be refilled? (please list name of each medication and dose if known) eliquis   2. Which pharmacy/location (including street and city if local pharmacy) is medication to be sent to? [facility]   3. Do they need a 30 day or 90 day supply? 90 day

## 2018-01-26 NOTE — Telephone Encounter (Signed)
Please review for refill, Thanks !  

## 2018-01-27 MED ORDER — APIXABAN 5 MG PO TABS: 5.0000 mg | ORAL_TABLET | Freq: Two times a day (BID) | ORAL | 1 refills | Status: AC

## 2018-01-27 NOTE — Telephone Encounter (Signed)
RX sent as requested 

## 2018-04-25 ENCOUNTER — Other Ambulatory Visit: Payer: Self-pay | Admitting: Internal Medicine

## 2018-05-19 ENCOUNTER — Telehealth: Payer: Self-pay

## 2018-05-26 ENCOUNTER — Other Ambulatory Visit: Payer: Self-pay | Admitting: Internal Medicine

## 2018-05-26 NOTE — Telephone Encounter (Signed)
Fax received from Dr. Eston Esters office stating the patient had requested a tier exception be done for eliquis.   Fax also stated they tried to process the tier exception, but insurance stated this needed to be submitted by the prescribing physician (Dr. Graciela Husbands).   I did call and speak with [employer] at [phone], and transferred to the  Coverage Review Dept at [phone].  They advised that on 05/18/18 April G initiated the tier exception process, but this was denied by the patient's plan as there is no other drug with a lower cost that is appropriate for him and he is currently paying the lowest cost share under his Medicare Part D plan.  Per [employer] rep, this determination was faxed back to April G at the [facility] location [phone] on 12/26.  Case # [id number].   Per [employer], we can fax an appeal letter on our letter head to the Level 1 appeals department at [phone].   I have called and spoken with the patient about the denial for Tier Exception.  He states he received a letter in the mail for this yesterday.  I inquired what he is paying for his eliquis. He states that he paid $460 out of pocket yesterday for a # 30 supply. Last year he was paying about $20-$30 for his copay, but this was a little higher around the end of the year last year.   I advised the patient I will speak with Dr. Graciela Husbands next week to see if he can do an appeal letter, but have also made him aware that due to the class of drug this is, the chances of getting the tier exception are fairly low.  I have also advised that once he meets a certain out of pocket for his prescription drug cost, his co-pays should go back down.  The patient voices understanding and was appreciative for the call back.  He is aware I will touch base with him when I have any further information for him.

## 2018-05-30 NOTE — Telephone Encounter (Signed)
Reviewed the information below with Dr. Caryl Comes. Per Dr. Caryl Comes, he did not feel like trying to do an appeal letter would help in this situation. He advised that the patient try to met his out of pocket drug cost with his prescriptions and then see if his co-pays will go down, otherwise can look at switching over to warfarin.  I have called and advised the patient of Dr. Olin Pia recommendations.  He voices understanding of the above and will wait to see how his co-pays will trend. I have advised that we can try to help with samples some and if he feels he needs this, he should touch base with [place] prior to running out of his medication and we will try to help.   The patient is agreeable with the above.

## 2018-06-23 IMAGING — RF DG LUMBAR SPINE 2-3V
1 series · 2 of 2 positions shown · non-contrast
Comparison: Radiograph of same day.

CLINICAL DATA: Surgical posterior fusion of L4-5.

EXAM:
DG C-ARM 61-120 MIN; LUMBAR SPINE - 2-3 VIEW
FLUOROSCOPY TIME:  58 seconds.

[Series 1: run · 2 of 2 slices shown]
[im 1/2]
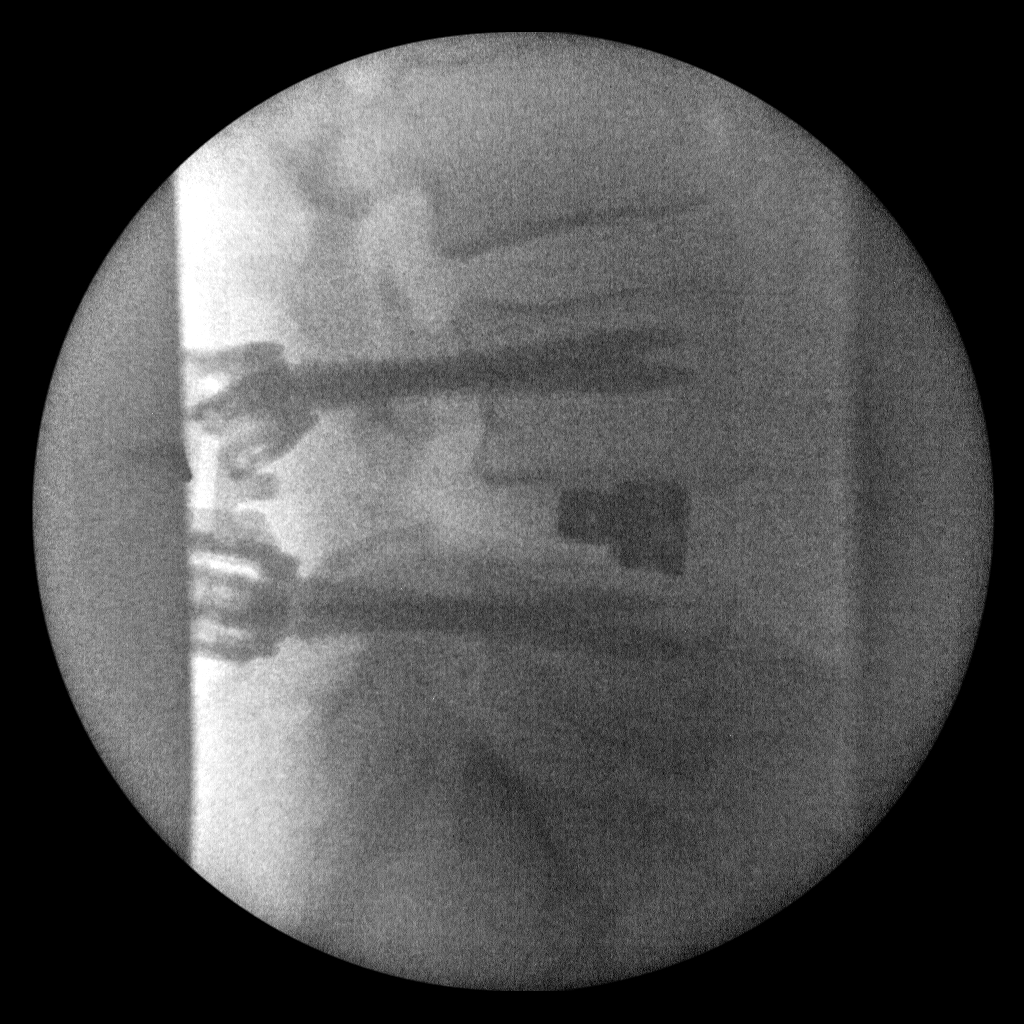
[im 2/2]
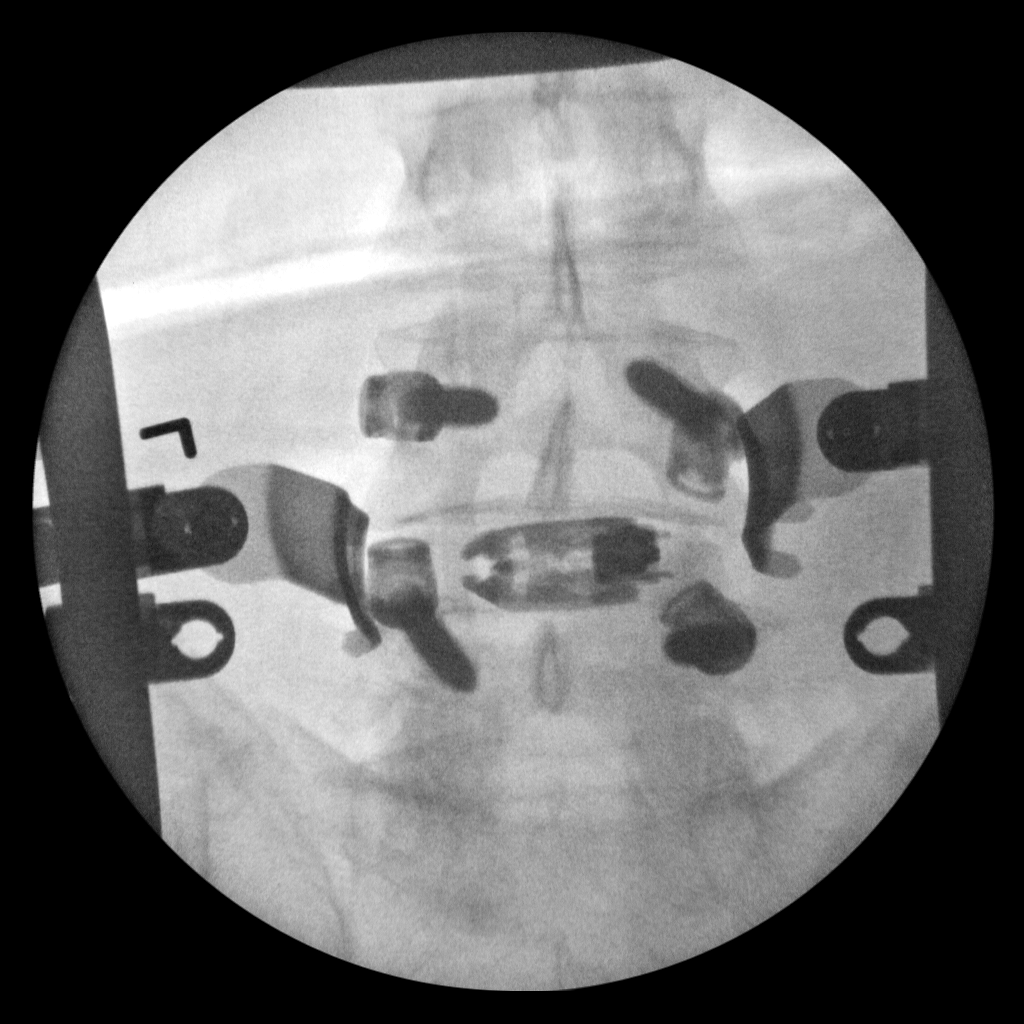

[2 of 2 positions shown; findings below may reference images not displayed]

FINDINGS: Status post surgical posterior fusion of L4-5 with bilateral
intrapedicular screw placement and interbody fusion. Good alignment
of vertebral bodies is noted.
IMPRESSION: Status post surgical posterior fusion of L4-5.

## 2018-06-29 ENCOUNTER — Ambulatory Visit (INDEPENDENT_AMBULATORY_CARE_PROVIDER_SITE_OTHER): Payer: Medicare Other | Admitting: Internal Medicine

## 2018-06-29 ENCOUNTER — Encounter: Payer: Self-pay | Admitting: Internal Medicine

## 2018-06-29 VITALS — BP 108/72 | HR 91 | Ht 77.0 in | Wt >= 6400 oz

## 2018-06-29 DIAGNOSIS — I509 Heart failure, unspecified: Secondary | ICD-10-CM

## 2018-06-29 DIAGNOSIS — Z79899 Other long term (current) drug therapy: Secondary | ICD-10-CM

## 2018-06-29 DIAGNOSIS — I428 Other cardiomyopathies: Secondary | ICD-10-CM

## 2018-06-29 DIAGNOSIS — I4821 Permanent atrial fibrillation: Secondary | ICD-10-CM | POA: Diagnosis not present

## 2018-06-29 MED ORDER — FUROSEMIDE 80 MG PO TABS: ORAL_TABLET | ORAL | 1 refills | Status: DC

## 2018-06-29 MED ORDER — POTASSIUM CHLORIDE ER 10 MEQ PO TBCR: EXTENDED_RELEASE_TABLET | ORAL | 1 refills | Status: DC

## 2018-06-29 NOTE — Progress Notes (Signed)
Patient Care Team: Barbette Reichmann, MD as PCP - General (Internal Medicine)   HPI  Luke Morgan is a 68 y.o. male Seen with a chief complaint of atrial fibrillation with initial failure to cardiovert.  He was treated wit hpropafenone  and dofetilide.  Because of recurrent failure to maintain sinus,  antiarrhythmic therapy was abandoned with a strategy of rate control.  He is treated with apixaban    DATE TEST EF   Sum 2018 Echo   43 %             Date Cr K Hgb  7/18  0.88 4.0 15.5  11/18  1.02 3.5 14.2  2/19 0.9 3.9 13.5  7/19 1.0 4.5 15  12/19 1.1 4.3 14.7    He has treated sleep apnea; however, his machine has not been read in years.  He has significant daytime somnolence.  Increasing fatigue.  Interval increase in his metoprolol succinate  Worsening dyspnea on exertion with peripheral edema.  His diet is salt replete as well as fluid replete.  Not exercising.  Denies chest pain.  No bleeding.  Thromboembolic risk factors ( age -89 , HTN-1,) for a CHADSVASc Score of  2 Past Medical History:  Diagnosis Date  . Asthma   . Atrial fibrillation (HCC)   . History of kidney stones   . Hypertension   . Kidney stones   . Obesity   . Osteoarthritis   . Pre-diabetes    patient is taking metformin  . Sleep apnea   . Spondylolisthesis of lumbar region     Past Surgical History:  Procedure Laterality Date  . CARDIOVERSION N/A 12/24/2016   Procedure: CARDIOVERSION;  Surgeon: Antonieta Iba, MD;  Location: [facility] ORS;  Service: Cardiovascular;  Laterality: N/A;  . CARDIOVERSION N/A 01/04/2017   Procedure: CARDIOVERSION;  Surgeon: Duke Salvia, MD;  Location: [facility] ENDOSCOPY;  Service: Cardiovascular;  Laterality: N/A;  . CARDIOVERSION N/A 03/31/2017   Procedure: CARDIOVERSION;  Surgeon: Laurey Morale, MD;  Location: [facility] ENDOSCOPY;  Service: Cardiovascular;  Laterality: N/A;  . COLONOSCOPY    . LUMBAR FUSION    . TOOTH EXTRACTION      Current  Outpatient Medications  Medication Sig Dispense Refill  . allopurinol (ZYLOPRIM) 300 MG tablet as needed.    Marland Kitchen apixaban (ELIQUIS) 5 MG TABS tablet Take 1 tablet (5 mg total) by mouth 2 (two) times daily. 180 tablet 1  . Fluticasone-Salmeterol (ADVAIR) 100-50 MCG/DOSE AEPB Inhale 1 puff into the lungs 2 (two) times daily as needed (shortness of breath).     . furosemide (LASIX) 40 MG tablet Take 1 tablet (40 mg total) by mouth every other day. 45 tablet 3  . hydrALAZINE (APRESOLINE) 50 MG tablet Take 50 mg by mouth 3 (three) times daily.    Marland Kitchen losartan (COZAAR) 100 MG tablet Take 1 tablet (100 mg total) by mouth daily. 90 tablet 2  . metFORMIN (GLUCOPHAGE) 500 MG tablet Take 500 mg daily with breakfast by mouth.     . metoprolol succinate (TOPROL-XL) 100 MG 24 hr tablet Take 100 mg by mouth daily. Take with or immediately following a meal.    . metoprolol succinate (TOPROL-XL) 50 MG 24 hr tablet Take 50 mg by mouth daily.     . montelukast (SINGULAIR) 10 MG tablet Take by mouth as needed.    . Multiple Vitamins-Minerals (MULTIVITAMIN WITH MINERALS) tablet Take 1 tablet by mouth daily.    . potassium  chloride (K-DUR) 10 MEQ tablet Take 1 tablet (10 mEq total) by mouth every other day. On days you take furosemide 45 tablet 3  . spironolactone (ALDACTONE) 25 MG tablet Take 1 tablet (25 mg total) daily by mouth. 30 tablet 11   No current facility-administered medications for this visit.     Allergies  Allergen Reactions  . Penicillins     UNSPECIFIED REACTION   Has patient had a PCN reaction causing immediate rash, facial/tongue/throat swelling, SOB or lightheadedness with hypotension: Unknown Has patient had a PCN reaction causing severe rash involving mucus membranes or skin necrosis: Unknown Has patient had a PCN reaction that required hospitalization No Has patient had a PCN reaction occurring within the last 10 years: No If all of the above answers are "NO", then may proceed with  Cephalosporin use.      Review of Systems negative except from HPI and PMH  Physical Exam BP 108/72 (BP Location: Left Arm, Patient Position: Sitting, Cuff Size: Large)   Pulse 91   Ht 6\' 5"  (1.956 m)   Wt (!) 404 lb 8 oz (183.5 kg)   SpO2 94%   BMI 47.97 kg/m   Well developed and morbidly obese in no acute distress HENT normal Neck supple with JVP-8-10 Clear Regular rate and rhythm, no murmurs or gallops Abd-soft with active BS No Clubbing cyanosis 3+ edema Skin-warm and dry; mild erythema A & Oriented  Grossly normal sensory and motor function  ECG: Atrial fibrillation at 91 Interval-/08/35 Assessment and  Plan  Atrial fibrillation-permanent  Congestive heart failure  Acute/chronic mixed HFrEF/ HFpEF  Morbid obesity   Hypertension   Cardiomyopathy-nonischemic presumably   High Risk Medication Surveillance  On Anticoagulation;  No bleeding issues   Significantly volume overloaded.  His response to his furosemide is not great and may be attenuated by gastric edema.  We will try up titration first 40--80.  In the event that he does not diurese adequately he is to increase it from 80--120.  If this is insufficient we will switch him to [facility].  Would undertake daily diuretics for 5 days once we achieve significant diuresis.  They will change to every other day on the diuretic effective dose.  Encouraged to decrease sodium intake as well as fluid intake.  We have discussed the physiology of heart failure including the importance of salt restriction and fluid restriction and have reviewed sources of dietary salt and water.  Encouraged him to follow-up with his sleep apnea team; his machine may or may not be effective as he has significant daytime somnolence.  Significant fatigue.  Do not know whether this is related to heart failure, sleep apnea or the up titration of his metoprolol succinate.  We will undertake a 2-week metoprolol succinate decreased trial to see  whether his fatigue is attenuated thereby.  In the event that this is the case, would try alternative beta-blockers and/or calcium blockers for rate control.  We also need to ask the question as to whether some of his dyspnea on exertion is not just related to volume overload but is associated with tachycardia.  His resting rate is 90.  However, given the volume overload and not sure what the right rate control regime will be will defer assessment of adequacy of rate control until after these 2 issues are resolved.  More than 50% of 40 min was spent in counseling related to the above

## 2018-06-29 NOTE — Patient Instructions (Signed)
Medication Instructions:  - Your physician has recommended you make the following change in your medication:   1) DECREASE toprol (metoprolol succinate) 50 mg- take 1 & 1/2 tablets (75 mg) by mouth once daily  2) INCREASE lasix (furosemide) to 80 mg- take 1 tablet by mouth once daily x 5 days, then take 1 tablet (80 mg) by mouth once every other day  3) INCRESASE potassium 10 meq- take 2 tablets (20 meq) by mouth once daily x 5 days, then take 2 tablets (20 meq) by mouth once every other day  If you need a refill on your cardiac medications before your next appointment, please call your pharmacy.   Lab work: - Your physician recommends that you return for lab work in: 1 week- BMP/ CBC  If you have labs (blood work) drawn today and your tests are completely normal, you will receive your results only by: Marland Kitchen MyChart Message (if you have MyChart) OR . A paper copy in the mail If you have any lab test that is abnormal or we need to change your treatment, we will call you to review the results.  Testing/Procedures: - none ordered  Follow-Up: At [facility], you and your health needs are our priority.  As part of our continuing mission to provide you with exceptional heart care, we have created designated Provider Care Teams.  These Care Teams include your primary Cardiologist (physician) and Advanced Practice Providers (APPs -  Physician Assistants and Nurse Practitioners) who all work together to provide you with the care you need, when you need it.  . You will need a follow up appointment in 6 months with Dr. Graciela Husbands.  Please call our office 2 months in advance to schedule this appointment.   . 4 weeks with Thayer Ohm, NP/ Alycia Rossetti, [place]  Any Other Special Instructions Will Be Listed Below (If Applicable). - N/A

## 2018-07-06 ENCOUNTER — Other Ambulatory Visit (INDEPENDENT_AMBULATORY_CARE_PROVIDER_SITE_OTHER): Payer: Medicare Other | Admitting: *Deleted

## 2018-07-06 DIAGNOSIS — Z79899 Other long term (current) drug therapy: Secondary | ICD-10-CM

## 2018-07-06 DIAGNOSIS — I4821 Permanent atrial fibrillation: Secondary | ICD-10-CM | POA: Diagnosis not present

## 2018-07-07 LAB — CBC WITH DIFFERENTIAL/PLATELET
Basophils Absolute: 0 10*3/uL (ref 0.0–0.2)
Basos: 0 %
EOS (ABSOLUTE): 0.1 10*3/uL (ref 0.0–0.4)
Eos: 1 %
HEMATOCRIT: 48.1 % (ref 37.5–51.0)
HEMOGLOBIN: 15.9 g/dL (ref 13.0–17.7)
Immature Grans (Abs): 0 10*3/uL (ref 0.0–0.1)
Immature Granulocytes: 0 %
Lymphocytes Absolute: 2.1 10*3/uL (ref 0.7–3.1)
Lymphs: 24 %
MCH: 28.4 pg (ref 26.6–33.0)
MCHC: 33.1 g/dL (ref 31.5–35.7)
MCV: 86 fL (ref 79–97)
MONOCYTES: 9 %
Monocytes Absolute: 0.8 10*3/uL (ref 0.1–0.9)
Neutrophils Absolute: 5.6 10*3/uL (ref 1.4–7.0)
Neutrophils: 66 %
Platelets: 294 10*3/uL (ref 150–450)
RBC: 5.59 x10E6/uL (ref 4.14–5.80)
RDW: 13.7 % (ref 11.6–15.4)
WBC: 8.6 10*3/uL (ref 3.4–10.8)

## 2018-07-25 NOTE — Progress Notes (Signed)
Cardiology Office Note Date:  07/28/2018  Patient ID:  DANDREW, HUBLER June 07, 1950, MRN [id number] PCP:  Barbette Reichmann, MD  Cardiologist:  Dr. Graciela Husbands, MD    Chief Complaint: Follow up  History of Present Illness: Luke Morgan is a 68 y.o. male with history of permanent Afib on Eliquis, chronic combined CHF, presumed NICM, HTN, OSA on CPAP with machine not being read in years with significant daytime somnolence, renal stones and morbid obesity who presents for follow up of acute on chronic combined CHF.  The patient has previously undergone multiple cardioversions with some being unsuccessful and some being briefly successful. Cardioversion attempts have been associated with chest compression faciliated DCCV as well as AAT including propafenone and Tikosyn. Previously seen in the Afib clinic. In the setting of recurrent failure to maintain sinus rhythm, a rate control strategy has been employed. Remote echo from 2018 showed an EF of 43%, further details unclear. No prior ischemic evaluations on file for review.   He was most recently seen by Dr. Graciela Husbands on 06/29/2018 noting significant daytime somnolence and fatigue. It was noted the patient's CPAP had not been read in years. It was uncertain to what extent, if any, the increase of metoprolol was playing in his symptoms. He also noted worsening DOE with peripheral edema. His weight was noted to be 404 pounds, which was up 24 pounds compared to his visit in 12/2017. He was noted to not monitor fluid or salt intake. His Lasix was increased to 80 mg daily x 5 days, then 80 mg every other day thereafter. His Toprol was decreased to 75 mg daily as it was uncertain if his fatigue was beta blocker mediated vs sleep apnea vs heart failure. EP could also not exclude tachy-mediated DOE given his resting heart rate was noted to be 90 bpm.   Labs: 06/2018 - HGB 15.9,  04/2018 - K+ 4.3, SCr 1.1, LFT normal, albumin 3.7, LDL 100 11/2017 - TSH  normal  He continues to note fatigue without associated chest pain or shortness of breath.  He does report some improvement in symptoms following outpatient diuresis as outlined above.  He reports a decrease in weight down to 390 pounds following his 5-day course of Lasix and has subsequently gained back to 396 pounds on home scale as of this morning.  He does try and limit his water intake.  He reports she does not add salt to food though eats what he is provided.  He eats out at restaurants 2-3 times per week.  He denies any orthopnea or early satiety.  No dizziness, presyncope, or syncope.  Lower extremity swelling is stable to slightly improved.  He did note a vigorous urine output with the above Lasix titration.  He does report having a new CPAP at home that he was advised to start using approximately 1 year prior though has not as he states his old one still works.  He reports he is followed for sleep apnea by his PCP.     Past Medical History:  Diagnosis Date  . Asthma   . Atrial fibrillation (HCC)   . History of kidney stones   . Hypertension   . Kidney stones   . Obesity   . Osteoarthritis   . Pre-diabetes    patient is taking metformin  . Sleep apnea   . Spondylolisthesis of lumbar region     Past Surgical History:  Procedure Laterality Date  . CARDIOVERSION N/A 12/24/2016  Procedure: CARDIOVERSION;  Surgeon: Antonieta Iba, MD;  Location: [facility] ORS;  Service: Cardiovascular;  Laterality: N/A;  . CARDIOVERSION N/A 01/04/2017   Procedure: CARDIOVERSION;  Surgeon: Duke Salvia, MD;  Location: [facility] ENDOSCOPY;  Service: Cardiovascular;  Laterality: N/A;  . CARDIOVERSION N/A 03/31/2017   Procedure: CARDIOVERSION;  Surgeon: Laurey Morale, MD;  Location: [facility] ENDOSCOPY;  Service: Cardiovascular;  Laterality: N/A;  . COLONOSCOPY    . LUMBAR FUSION    . TOOTH EXTRACTION      Current Meds  Medication Sig  . allopurinol (ZYLOPRIM) 300 MG tablet as needed.  Marland Kitchen apixaban (ELIQUIS) 5  MG TABS tablet Take 1 tablet (5 mg total) by mouth 2 (two) times daily.  . Fluticasone-Salmeterol (ADVAIR) 100-50 MCG/DOSE AEPB Inhale 1 puff into the lungs 2 (two) times daily as needed (shortness of breath).   . furosemide (LASIX) 80 MG tablet Take 1 tablet (80 mg) by mouth once every other day  . hydrALAZINE (APRESOLINE) 50 MG tablet Take 50 mg by mouth 3 (three) times daily.  Marland Kitchen losartan (COZAAR) 100 MG tablet Take 1 tablet (100 mg total) by mouth daily.  . metFORMIN (GLUCOPHAGE) 500 MG tablet Take 500 mg daily with breakfast by mouth.   . metoprolol succinate (TOPROL-XL) 50 MG 24 hr tablet Take 1 & 1/2 tablets (75 mg) by mouth once daily  . montelukast (SINGULAIR) 10 MG tablet Take by mouth as needed.  . Multiple Vitamins-Minerals (MULTIVITAMIN WITH MINERALS) tablet Take 1 tablet by mouth daily.  . potassium chloride (K-DUR) 10 MEQ tablet Take 2 tablets (20 meq) by mouth once every other day on the days you take lasix (furosemide)    Allergies:   Penicillins   Social History:  The patient  reports that he has never smoked. He has never used smokeless tobacco. He reports current alcohol use. He reports that he does not use drugs.   Family History:  The patient's family history includes Cancer in his mother and paternal grandfather; Heart disease in his father; Parkinson's disease in his mother.  ROS:   Review of Systems  Constitutional: Positive for malaise/fatigue. Negative for chills, diaphoresis, fever and weight loss.  HENT: Negative for congestion.   Eyes: Negative for discharge and redness.  Respiratory: Negative for cough, hemoptysis, sputum production, shortness of breath and wheezing.   Cardiovascular: Positive for leg swelling. Negative for chest pain, palpitations, orthopnea, claudication and PND.  Gastrointestinal: Negative for abdominal pain, blood in stool, heartburn, melena, nausea and vomiting.  Genitourinary: Negative for hematuria.  Musculoskeletal: Negative for falls  and myalgias.  Skin: Negative for rash.  Neurological: Positive for weakness. Negative for dizziness, tingling, tremors, sensory change, speech change, focal weakness and loss of consciousness.  Endo/Heme/Allergies: Does not bruise/bleed easily.  Psychiatric/Behavioral: Negative for substance abuse. The patient is not nervous/anxious.   All other systems reviewed and are negative.    PHYSICAL EXAM:  VS:  BP 118/80 (BP Location: Left Arm, Patient Position: Sitting, Cuff Size: Large)   Pulse 90   Ht 6\' 5"  (1.956 m)   Wt (!) 400 lb 4 oz (181.6 kg)   BMI 47.46 kg/m  BMI: Body mass index is 47.46 kg/m.  Physical Exam  Constitutional: He is oriented to person, place, and time. He appears well-developed and well-nourished.  Morbidly obese  HENT:  Head: Normocephalic and atraumatic.  Eyes: Right eye exhibits no discharge. Left eye exhibits no discharge.  Neck: Normal range of motion. JVD present.  JVD is difficult to assess  secondary to body habitus though does appear to be mildly elevated  Cardiovascular: Normal rate, S1 normal, S2 normal and normal heart sounds. An irregularly irregular rhythm present. Exam reveals no distant heart sounds, no friction rub, no midsystolic click and no opening snap.  No murmur heard. Pulses:      Posterior tibial pulses are 2+ on the right side and 2+ on the left side.  Pulmonary/Chest: Effort normal and breath sounds normal. No respiratory distress. He has no decreased breath sounds. He has no wheezes. He has no rales. He exhibits no tenderness.  Abdominal: Soft. He exhibits no distension. There is no abdominal tenderness.  Musculoskeletal:        General: Edema present.     Comments: Trace bilateral pretibial lower extremity edema  Neurological: He is alert and oriented to person, place, and time.  Skin: Skin is warm and dry. No cyanosis. Nails show no clubbing.  Psychiatric: He has a normal mood and affect. His speech is normal and behavior is normal.  Judgment and thought content normal.     EKG:  Was ordered and interpreted by me today. Shows A. fib, 90 bpm, no acute ST-T changes  Recent Labs: 07/06/2018: Hemoglobin 15.9; Platelets 294  No results found for requested labs within last 8760 hours.   CrCl cannot be calculated (Patient's most recent lab result is older than the maximum 21 days allowed.).   Wt Readings from Last 3 Encounters:  07/28/18 (!) 400 lb 4 oz (181.6 kg)  06/29/18 (!) 404 lb 8 oz (183.5 kg)  01/05/18 (!) 380 lb (172.4 kg)     Other studies reviewed: Additional studies/records reviewed today include: summarized above  ASSESSMENT AND PLAN:  1. Acute on chronic combined CHF secondary to presumed nonischemic cardiomyopathy: Volume assessment is difficult given his body habitus though he does appear to be volume overloaded.  He did note good symptom improvement following outpatient diuresis with Lasix as above.  In this setting, we will transition him to torsemide 40 mg daily for 5 days followed by 20 mg daily thereafter.  He will continue current dose of potassium supplementation along with spironolactone and metoprolol.  Check BMP today and again in 1 week.  If symptoms persist, consider echo in follow-up.  2. Fatigue: Likely multifactorial including morbid obesity, physical deconditioning, likely poorly controlled sleep apnea, possible component of heart failure and A. fib.  Recommend he contact his PCP for further direction on transitioning to his new CPAP which he has not been using for the past year.  I suspect his symptoms are mostly related to his obesity, physical deconditioning, and poorly treated sleep apnea.  Recent CBC normal.  No symptoms consistent with typical angina.  Nonetheless, given his body habitus noninvasive ischemic evaluation would be of little utility.  3. Permanent A. fib: Ventricular rate is reasonably controlled.  Continue current dose of Eliquis and Toprol-XL.  He did inquire about  transitioning to Pradaxa.  I have advised him to discuss this further with his primary cardiologist.  No bleeding issues.  4. Morbid obesity: Weight loss is advised.  5. Sleep apnea: Followed by PCP.  As above.  Disposition: F/u with Dr. Graciela Husbands in 4 weeks.  Current medicines are reviewed at length with the patient today.  The patient did not have any concerns regarding medicines.  Signed, Eula Listen, PA-C 07/28/2018 11:46 AM     [facility] [address] [place], [place] [address] [phone]

## 2018-07-28 ENCOUNTER — Ambulatory Visit (INDEPENDENT_AMBULATORY_CARE_PROVIDER_SITE_OTHER): Payer: Medicare Other | Admitting: Physician Assistant

## 2018-07-28 ENCOUNTER — Encounter: Payer: Self-pay | Admitting: Physician Assistant

## 2018-07-28 VITALS — BP 118/80 | HR 90 | Ht 77.0 in | Wt >= 6400 oz

## 2018-07-28 DIAGNOSIS — I4821 Permanent atrial fibrillation: Secondary | ICD-10-CM | POA: Diagnosis not present

## 2018-07-28 DIAGNOSIS — I480 Paroxysmal atrial fibrillation: Secondary | ICD-10-CM

## 2018-07-28 DIAGNOSIS — R5383 Other fatigue: Secondary | ICD-10-CM | POA: Diagnosis not present

## 2018-07-28 DIAGNOSIS — I5043 Acute on chronic combined systolic (congestive) and diastolic (congestive) heart failure: Secondary | ICD-10-CM | POA: Diagnosis not present

## 2018-07-28 DIAGNOSIS — I428 Other cardiomyopathies: Secondary | ICD-10-CM

## 2018-07-28 DIAGNOSIS — G4730 Sleep apnea, unspecified: Secondary | ICD-10-CM

## 2018-07-28 MED ORDER — TORSEMIDE 20 MG PO TABS: ORAL_TABLET | ORAL | 3 refills | Status: DC

## 2018-07-28 NOTE — Patient Instructions (Signed)
Medication Instructions:  Your physician has recommended you make the following change in your medication:   1) STOP Lasix 2) START Torsemide 40mg  daily for 5 days. Then take 20mg  daily  If you need a refill on your cardiac medications before your next appointment, please call your pharmacy.   Lab work: Marketing executive recommends that you return for lab work in: 1 week Designer, jewellery)  If you have labs (blood work) drawn today and your tests are completely normal, you will receive your results only by: Marland Kitchen MyChart Message (if you have MyChart) OR . A paper copy in the mail If you have any lab test that is abnormal or we need to change your treatment, we will call you to review the results.  Testing/Procedures: None ordered  Follow-Up: At [facility], you and your health needs are our priority.  As part of our continuing mission to provide you with exceptional heart care, we have created designated Provider Care Teams.  These Care Teams include your primary Cardiologist (physician) and Advanced Practice Providers (APPs -  Physician Assistants and Nurse Practitioners) who all work together to provide you with the care you need, when you need it.  You will need a follow up appointment in 4 weeks with Dr.Klein.

## 2018-07-29 LAB — BASIC METABOLIC PANEL
BUN/Creatinine Ratio: 25 — ABNORMAL HIGH (ref 10–24)
BUN: 29 mg/dL — ABNORMAL HIGH (ref 8–27)
CO2: 20 mmol/L (ref 20–29)
Calcium: 9.5 mg/dL (ref 8.6–10.2)
Chloride: 104 mmol/L (ref 96–106)
Creatinine, Ser: 1.17 mg/dL (ref 0.76–1.27)
GFR calc Af Amer: 75 mL/min/{1.73_m2} (ref >59)
GFR calc non Af Amer: 65 mL/min/{1.73_m2} (ref >59)
Glucose: 109 mg/dL — ABNORMAL HIGH (ref 65–99)
Potassium: 4.7 mmol/L (ref 3.5–5.2)
Sodium: 144 mmol/L (ref 134–144)

## 2018-07-31 ENCOUNTER — Telehealth: Payer: Self-pay

## 2018-07-31 NOTE — Telephone Encounter (Signed)
Called to make pt aware of new information in [place]. Left message and encouraged him to call if he has any further questions or concerns.

## 2018-08-04 ENCOUNTER — Other Ambulatory Visit: Payer: Self-pay

## 2018-08-04 ENCOUNTER — Other Ambulatory Visit (INDEPENDENT_AMBULATORY_CARE_PROVIDER_SITE_OTHER): Payer: Medicare Other

## 2018-08-04 DIAGNOSIS — I4821 Permanent atrial fibrillation: Secondary | ICD-10-CM

## 2018-08-05 LAB — BASIC METABOLIC PANEL
BUN/Creatinine Ratio: 26 — ABNORMAL HIGH (ref 10–24)
BUN: 31 mg/dL — ABNORMAL HIGH (ref 8–27)
CALCIUM: 9.2 mg/dL (ref 8.6–10.2)
CO2: 26 mmol/L (ref 20–29)
Chloride: 101 mmol/L (ref 96–106)
Creatinine, Ser: 1.17 mg/dL (ref 0.76–1.27)
GFR calc Af Amer: 75 mL/min/{1.73_m2} (ref >59)
GFR calc non Af Amer: 65 mL/min/{1.73_m2} (ref >59)
Glucose: 109 mg/dL — ABNORMAL HIGH (ref 65–99)
Potassium: 4.3 mmol/L (ref 3.5–5.2)
Sodium: 140 mmol/L (ref 134–144)

## 2018-08-08 ENCOUNTER — Telehealth: Payer: Self-pay | Admitting: Internal Medicine

## 2018-08-08 NOTE — Telephone Encounter (Signed)
Called and spoke to pt regarding appointment 08/15/18 for afib NICM and CHF acute/chronic    Currently symptoms with more edema and SOB qnd we will see patient as scheduled   Pt is agreeable and advised to call if interval problems

## 2018-08-11 ENCOUNTER — Telehealth: Payer: Self-pay | Admitting: Internal Medicine

## 2018-08-11 NOTE — Telephone Encounter (Signed)
Dr. Graciela Husbands spoke with the patient last week regarding his symptoms. He advised him to increase his torsemide to 40 mg once daily. Per Dr. Graciela Husbands, he would still like to see the patient on Tuesday 3/24.  I called the patient today for pre-screening questions.   COVID-19 Pre-Screening Questions:  . Do you currently have a fever? No (yes = cancel and refer to pcp for e-visit) . Have you recently travelled on a cruise, internationally, or to [place], [place], [place], [place], [place], or [place], [place] [employer]) ? No (yes = cancel, stay home, monitor symptoms, and contact pcp or initiate e-visit if symptoms develop) . Have you been in contact with someone that is currently pending confirmation of Covid19 testing or has been confirmed to have the Covid19 virus?  No (yes = cancel, stay home, away from tested individual, monitor symptoms, and contact pcp or initiate e-visit if symptoms develop) . Are you currently experiencing fatigue or cough? No (yes = pt should be prepared to have a mask placed at the time of their visit).

## 2018-08-14 ENCOUNTER — Other Ambulatory Visit: Payer: Self-pay

## 2018-08-14 ENCOUNTER — Ambulatory Visit (INDEPENDENT_AMBULATORY_CARE_PROVIDER_SITE_OTHER): Payer: Medicare Other | Admitting: Internal Medicine

## 2018-08-14 DIAGNOSIS — I110 Hypertensive heart disease with heart failure: Secondary | ICD-10-CM | POA: Diagnosis not present

## 2018-08-14 DIAGNOSIS — E669 Obesity, unspecified: Secondary | ICD-10-CM

## 2018-08-14 DIAGNOSIS — I429 Cardiomyopathy, unspecified: Secondary | ICD-10-CM

## 2018-08-14 DIAGNOSIS — I4821 Permanent atrial fibrillation: Secondary | ICD-10-CM

## 2018-08-14 DIAGNOSIS — I5022 Chronic systolic (congestive) heart failure: Secondary | ICD-10-CM

## 2018-08-14 DIAGNOSIS — I5033 Acute on chronic diastolic (congestive) heart failure: Secondary | ICD-10-CM

## 2018-08-14 DIAGNOSIS — I4891 Unspecified atrial fibrillation: Secondary | ICD-10-CM

## 2018-08-14 NOTE — Progress Notes (Signed)
Tele-Health Visit     Evaluation Performed:  Follow-up visit  This visit type was conducted due to national recommendations for restrictions regarding the COVID-19 Pandemic (e.g. social distancing).  This format is felt to be most appropriate for this patient at this time.  All issues noted in this document were discussed and addressed.  No physical exam was performed (except for noted visual exam findings with Telehealth visits).  See MyChart message from today for the patient's consent to telehealth for [facility]  Date:  08/14/2018   ID:  Luke Morgan, DOB 31-May-1950, MRN [id number]  Patient Location:    [address] [place] [address]   Provider location:   [place]  PCP:  Barbette Reichmann, MD  Cardiologist:  No primary care provider on file.  Electrophysiologist: SK   Chief Complaint:  Atrial fibrillation and heart failure   History of Present Illness:    Luke Morgan is a 68 y.o. male who presents via audio/video conferencing for a telehealth visit today.    Improved diuresis  Swelling no different  Weight unchanged  Increased fluid intake   Dyspnea no different  02 97 Hr 90 0n pulse oximeter  Metoprolol reduced 100/50>> 50/25 to assess fatigue   Somewhat improved   The patient does not symptoms concerning for COVID-19 infection (fever, chills, cough, or new SHORTNESS OF BREATH).    Prior CV studies:   The following studies were reviewed today:     Past Medical History:  Diagnosis Date  . Asthma   . Atrial fibrillation (HCC)   . History of kidney stones   . Hypertension   . Kidney stones   . Obesity   . Osteoarthritis   . Pre-diabetes    patient is taking metformin  . Sleep apnea   . Spondylolisthesis of lumbar region    Past Surgical History:  Procedure Laterality Date  . CARDIOVERSION N/A 12/24/2016   Procedure: CARDIOVERSION;  Surgeon: Antonieta Iba, MD;  Location: [facility] ORS;  Service: Cardiovascular;   Laterality: N/A;  . CARDIOVERSION N/A 01/04/2017   Procedure: CARDIOVERSION;  Surgeon: Duke Salvia, MD;  Location: [facility] ENDOSCOPY;  Service: Cardiovascular;  Laterality: N/A;  . CARDIOVERSION N/A 03/31/2017   Procedure: CARDIOVERSION;  Surgeon: Laurey Morale, MD;  Location: [facility] ENDOSCOPY;  Service: Cardiovascular;  Laterality: N/A;  . COLONOSCOPY    . LUMBAR FUSION    . TOOTH EXTRACTION       No outpatient medications have been marked as taking for the 08/14/18 encounter (Appointment) with Duke Salvia, MD.     Allergies:   Penicillins   Social History   Tobacco Use  . Smoking status: Never Smoker  . Smokeless tobacco: Never Used  Substance Use Topics  . Alcohol use: Yes    Comment: meals  . Drug use: No     Family Hx: The patient's family history includes Cancer in his mother and paternal grandfather; Heart disease in his father; Parkinson's disease in his mother.  ROS:   Please see the history of present illness.      All other systems reviewed and are negative.   Labs/Other Tests and Data Reviewed:    Recent Labs: 07/06/2018: Hemoglobin 15.9; Platelets 294 08/04/2018: BUN 31; Creatinine, Ser 1.17; Potassium 4.3; Sodium 140   Recent Lipid Panel No results found for: CHOL, TRIG, HDL, CHOLHDL, LDLCALC, LDLDIRECT  Wt Readings from Last 3 Encounters:  07/28/18 (!) 400 lb 4 oz (181.6 kg)  06/29/18 (!) 404 lb 8 oz (183.5 kg)  01/05/18 (!) 380 lb (172.4 kg)     Exam:    Vital Signs:     Well nourished, well developed male in no acute distress. Edema 1-2+ pitting  ASSESSMENT & PLAN:    Atrial fibrillation-- permanent HFrEF chronic systolic Obesity HTN Cardiomyopathy  Pt remains volume overloaded; will continue on the augmented diuresis and try to decrease fluid intake Weight goal is about 390 or so  Feels better on lower dose metop succinate and the deriviative questions are-- should we stop metop succ altogether and try alternative betablockers or  CCBs-- for now will try and assess exertional HR and BP using his home BP and oximeter  No bleeding    COVID-19 Education: The signs and symptoms of COVID-19 were discussed with the patient and how to seek care for testing (follow up with PCP or arrange E-visit).   The importance of social distancing was discussed today.  Patient Risk:   After full review of this patients clinical status, I feel that they are at least moderate risk at this time.  Time:   Today, I have spent 23  minutes with the patient with telehealth technology discussing heart failure .     Medication Adjustments/Labs and Tests Ordered: Current medicines are reviewed at length with the patient today.  Concerns regarding medicines are outlined above.  Tests Ordered: No orders of the defined types were placed in this encounter.  Medication Changes: No orders of the defined types were placed in this encounter.   Disposition:  tele visit 3/31 Signed, Sherryl Manges, MD  08/14/2018 1:56 PM    [facility] [address] [place], [place], [place]  [address] Phone: [phone]; Fax: [phone]

## 2018-08-15 ENCOUNTER — Ambulatory Visit: Payer: Medicare Other | Admitting: Internal Medicine

## 2018-09-18 ENCOUNTER — Other Ambulatory Visit: Payer: Self-pay | Admitting: Physician Assistant

## 2018-09-18 ENCOUNTER — Encounter: Payer: Self-pay | Admitting: Internal Medicine

## 2018-09-18 NOTE — Progress Notes (Signed)
Tele-Health Visit     Evaluation Performed:  Follow-up visit  This visit type was conducted due to national recommendations for restrictions regarding the COVID-19 Pandemic (e.g. social distancing).  This format is felt to be most appropriate for this patient at this time.  All issues noted in this document were discussed and addressed.  No physical exam was performed (except for noted visual exam findings with Telehealth visits).  See MyChart message from today for the patient's consent to telehealth for [facility].  Date:  08/14/2018   ID:  Luke Morgan, DOB [birth year], MRN [id number]  Patient Location:    [address] [place] [address]   Provider location:   [place]  PCP:  Barbette Reichmann, MD  Cardiologist:  No primary care provider on file.  Electrophysiologist: SK   Chief Complaint:  Atrial fibrillation and heart failure   History of Present Illness:    Luke Morgan is a 68 y.o. male who presents via audio/video conferencing for a telehealth visit today.    Improved diuresis  Swelling no different  Weight unchanged  Increased fluid intake   Dyspnea no different  02 97 Hr 90 0n pulse oximeter  Metoprolol reduced 100/50>> 50/25 to assess fatigue   Somewhat improved   The patient does not symptoms concerning for COVID-19 infection (fever, chills, cough, or new SHORTNESS OF BREATH).    Prior CV studies:   The following studies were reviewed today:     Past Medical History:  Diagnosis Date  . Asthma   . Atrial fibrillation (HCC)   . History of kidney stones   . Hypertension   . Kidney stones   . Obesity   . Osteoarthritis   . Pre-diabetes    patient is taking metformin  . Sleep apnea   . Spondylolisthesis of lumbar region    Past Surgical History:  Procedure Laterality Date  . CARDIOVERSION N/A 12/24/2016   Procedure: CARDIOVERSION;  Surgeon: Antonieta Iba, MD;  Location: [facility] ORS;  Service: Cardiovascular;   Laterality: N/A;  . CARDIOVERSION N/A 01/04/2017   Procedure: CARDIOVERSION;  Surgeon: Duke Salvia, MD;  Location: [facility] ENDOSCOPY;  Service: Cardiovascular;  Laterality: N/A;  . CARDIOVERSION N/A 03/31/2017   Procedure: CARDIOVERSION;  Surgeon: Laurey Morale, MD;  Location: [facility] ENDOSCOPY;  Service: Cardiovascular;  Laterality: N/A;  . COLONOSCOPY    . LUMBAR FUSION    . TOOTH EXTRACTION       No outpatient medications have been marked as taking for the 08/14/18 encounter (Appointment) with Duke Salvia, MD.     Allergies:   Penicillins   Social History   Tobacco Use  . Smoking status: Never Smoker  . Smokeless tobacco: Never Used  Substance Use Topics  . Alcohol use: Yes    Comment: meals  . Drug use: No     Family Hx: The patient's family history includes Cancer in his mother and paternal grandfather; Heart disease in his father; Parkinson's disease in his mother.  ROS:   Please see the history of present illness.      All other systems reviewed and are negative.   Labs/Other Tests and Data Reviewed:    Recent Labs: 07/06/2018: Hemoglobin 15.9; Platelets 294 08/04/2018: BUN 31; Creatinine, Ser 1.17; Potassium 4.3; Sodium 140   Recent Lipid Panel No results found for: CHOL, TRIG, HDL, CHOLHDL, LDLCALC, LDLDIRECT  Wt Readings from Last 3 Encounters:  07/28/18 (!) 400 lb 4 oz (181.6 kg)  06/29/18 (!) 404 lb 8 oz (183.5 kg)  01/05/18 (!) 380 lb (172.4 kg)     Exam:    Vital Signs:     Well nourished, well developed male in no acute distress. Edema 1-2+ pitting  ASSESSMENT & PLAN:    Atrial fibrillation-- permanent HFrEF chronic systolic Obesity HTN Cardiomyopathy  Pt remains volume overloaded; will continue on the augmented diuresis and try to decrease fluid intake Weight goal is about 390 or so  Feels better on lower dose metop succinate and the deriviative questions are-- should we stop metop succ altogether and try alternative betablockers or  CCBs-- for now will try and assess exertional HR and BP using his home BP and oximeter  No bleeding    COVID-19 Education: The signs and symptoms of COVID-19 were discussed with the patient and how to seek care for testing (follow up with PCP or arrange E-visit).   The importance of social distancing was discussed today.  Patient Risk:   After full review of this patients clinical status, I feel that they are at least moderate risk at this time.  Time:   Today, I have spent 23  minutes with the patient with telehealth technology discussing heart failure .     Medication Adjustments/Labs and Tests Ordered: Current medicines are reviewed at length with the patient today.  Concerns regarding medicines are outlined above.  Tests Ordered: No orders of the defined types were placed in this encounter.  Medication Changes: No orders of the defined types were placed in this encounter.   Disposition:  tele visit 3/31 Signed, Steven Klein, MD  08/14/2018 1:56 PM    [facility] [address], [place], [place]  [address] Phone: [phone]; Fax: [phone]   

## 2018-09-20 ENCOUNTER — Other Ambulatory Visit: Payer: Self-pay

## 2018-09-20 MED ORDER — POTASSIUM CHLORIDE ER 10 MEQ PO TBCR: EXTENDED_RELEASE_TABLET | ORAL | 1 refills | Status: DC

## 2018-09-21 ENCOUNTER — Other Ambulatory Visit: Payer: Self-pay | Admitting: *Deleted

## 2018-09-21 DIAGNOSIS — I509 Heart failure, unspecified: Secondary | ICD-10-CM

## 2018-09-21 DIAGNOSIS — Z79899 Other long term (current) drug therapy: Secondary | ICD-10-CM

## 2018-09-21 DIAGNOSIS — I428 Other cardiomyopathies: Secondary | ICD-10-CM

## 2018-09-21 MED ORDER — TORSEMIDE 20 MG PO TABS: ORAL_TABLET | ORAL | 1 refills | Status: DC

## 2018-09-21 MED ORDER — POTASSIUM CHLORIDE ER 10 MEQ PO TBCR: EXTENDED_RELEASE_TABLET | ORAL | 1 refills | Status: AC

## 2018-09-25 ENCOUNTER — Other Ambulatory Visit: Payer: Self-pay

## 2018-09-25 MED ORDER — TORSEMIDE 20 MG PO TABS: ORAL_TABLET | ORAL | 1 refills | Status: AC

## 2018-09-28 ENCOUNTER — Other Ambulatory Visit: Payer: Self-pay

## 2018-09-28 ENCOUNTER — Other Ambulatory Visit
Admission: RE | Admit: 2018-09-28 | Discharge: 2018-09-28 | Disposition: A | Discharge status: Home / Self Care | Payer: Medicare Other | Source: Ambulatory Visit | Attending: Internal Medicine | Admitting: Internal Medicine

## 2018-09-28 DIAGNOSIS — I509 Heart failure, unspecified: Secondary | ICD-10-CM | POA: Diagnosis present

## 2018-09-28 DIAGNOSIS — Z79899 Other long term (current) drug therapy: Secondary | ICD-10-CM | POA: Diagnosis present

## 2018-09-28 DIAGNOSIS — I428 Other cardiomyopathies: Secondary | ICD-10-CM | POA: Diagnosis present

## 2018-09-28 LAB — BASIC METABOLIC PANEL
Anion gap: 9 (ref 5–15)
BUN: 31 mg/dL — ABNORMAL HIGH (ref 8–23)
CO2: 28 mmol/L (ref 22–32)
Calcium: 9 mg/dL (ref 8.9–10.3)
Chloride: 101 mmol/L (ref 98–111)
Creatinine, Ser: 1.3 mg/dL — ABNORMAL HIGH (ref 0.61–1.24)
GFR calc Af Amer: >60 mL/min (ref >60)
GFR calc non Af Amer: 57 mL/min — ABNORMAL LOW (ref >60)
Glucose, Bld: 110 mg/dL — ABNORMAL HIGH (ref 70–99)
Potassium: 4.4 mmol/L (ref 3.5–5.1)
Sodium: 138 mmol/L (ref 135–145)

## 2018-09-28 MED ORDER — FUROSEMIDE 40 MG PO TABS: 40.0000 mg | ORAL_TABLET | ORAL | 1 refills | Status: AC

## 2018-10-04 ENCOUNTER — Other Ambulatory Visit: Payer: Self-pay | Admitting: *Deleted

## 2018-10-04 DIAGNOSIS — I428 Other cardiomyopathies: Secondary | ICD-10-CM

## 2018-10-04 DIAGNOSIS — Z79899 Other long term (current) drug therapy: Secondary | ICD-10-CM

## 2018-10-04 DIAGNOSIS — I509 Heart failure, unspecified: Secondary | ICD-10-CM

## 2018-10-20 ENCOUNTER — Other Ambulatory Visit
Admission: RE | Admit: 2018-10-20 | Discharge: 2018-10-20 | Disposition: A | Discharge status: Home / Self Care | Payer: Medicare Other | Attending: Internal Medicine | Admitting: Internal Medicine

## 2018-10-20 ENCOUNTER — Other Ambulatory Visit: Payer: Self-pay

## 2018-10-20 DIAGNOSIS — I428 Other cardiomyopathies: Secondary | ICD-10-CM

## 2018-10-20 DIAGNOSIS — I509 Heart failure, unspecified: Secondary | ICD-10-CM

## 2018-10-20 DIAGNOSIS — Z79899 Other long term (current) drug therapy: Secondary | ICD-10-CM | POA: Diagnosis present

## 2018-10-20 LAB — BASIC METABOLIC PANEL
Anion gap: 10 (ref 5–15)
BUN: 29 mg/dL — ABNORMAL HIGH (ref 8–23)
CO2: 26 mmol/L (ref 22–32)
Calcium: 9.1 mg/dL (ref 8.9–10.3)
Chloride: 105 mmol/L (ref 98–111)
Creatinine, Ser: 1.06 mg/dL (ref 0.61–1.24)
GFR calc Af Amer: >60 mL/min (ref >60)
GFR calc non Af Amer: >60 mL/min (ref >60)
Glucose, Bld: 114 mg/dL — ABNORMAL HIGH (ref 70–99)
Potassium: 4.4 mmol/L (ref 3.5–5.1)
Sodium: 141 mmol/L (ref 135–145)

## 2018-12-12 NOTE — Progress Notes (Signed)
13 Thanks  steve

## 2018-12-23 DEATH — deceased
